# Patient Record
Sex: Female | Born: 1980 | Race: White | Hispanic: No | Marital: Married | Smoking: Never smoker
Health system: Southern US, Community
[De-identification: ages and names within clinical notes are randomized; demographics above are authoritative.]

## PROBLEM LIST (undated history)

## (undated) ENCOUNTER — Emergency Department (HOSPITAL_BASED_OUTPATIENT_CLINIC_OR_DEPARTMENT_OTHER): Admission: EM | Source: Home / Self Care

## (undated) DIAGNOSIS — K219 Gastro-esophageal reflux disease without esophagitis: Secondary | ICD-10-CM

## (undated) DIAGNOSIS — Z5189 Encounter for other specified aftercare: Secondary | ICD-10-CM

## (undated) DIAGNOSIS — Q339 Congenital malformation of lung, unspecified: Secondary | ICD-10-CM

## (undated) DIAGNOSIS — J189 Pneumonia, unspecified organism: Secondary | ICD-10-CM

## (undated) DIAGNOSIS — K760 Fatty (change of) liver, not elsewhere classified: Secondary | ICD-10-CM

## (undated) DIAGNOSIS — T7840XA Allergy, unspecified, initial encounter: Secondary | ICD-10-CM

## (undated) DIAGNOSIS — I10 Essential (primary) hypertension: Secondary | ICD-10-CM

## (undated) DIAGNOSIS — F419 Anxiety disorder, unspecified: Secondary | ICD-10-CM

## (undated) HISTORY — DX: Encounter for other specified aftercare: Z51.89

## (undated) HISTORY — DX: Allergy, unspecified, initial encounter: T78.40XA

## (undated) HISTORY — PX: WISDOM TOOTH EXTRACTION: SHX21

## (undated) HISTORY — PX: NO PAST SURGERIES: SHX2092

---
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
## 2005-08-19 DIAGNOSIS — Z8759 Personal history of other complications of pregnancy, childbirth and the puerperium: Secondary | ICD-10-CM

## 2005-08-19 HISTORY — DX: Personal history of other complications of pregnancy, childbirth and the puerperium: Z87.59

## 2006-08-13 ENCOUNTER — Inpatient Hospital Stay (HOSPITAL_COMMUNITY): Admission: AD | Admit: 2006-08-13 | Discharge: 2006-08-17 | Payer: Self-pay | Admitting: Obstetrics and Gynecology

## 2006-08-13 ENCOUNTER — Ambulatory Visit: Payer: Self-pay | Admitting: Neonatology

## 2006-09-16 ENCOUNTER — Inpatient Hospital Stay (HOSPITAL_COMMUNITY): Admission: AD | Admit: 2006-09-16 | Discharge: 2006-09-16 | Payer: Self-pay | Admitting: Obstetrics and Gynecology

## 2007-12-25 IMAGING — US US OB COMP +14 WK
1 series · 13 of 28 positions shown · non-contrast
Comparison: none

CLINICAL DATA: 25-year-old, G1, LMP 01/18/06 ([DATE] weeks 4 days), presenting with pregnancy induced hypertension, chest pain, elevated liver enzymes, possible HELLP syndrome.

[Series 1: us ob comp +14 wk · 0.30mm/px · 33 acquisitions, 13 frames shown]
[im 2/33]
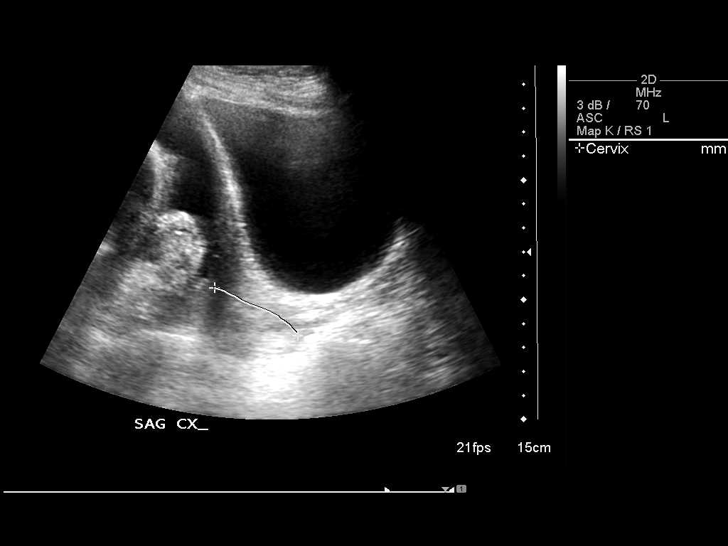
[im 4/33]
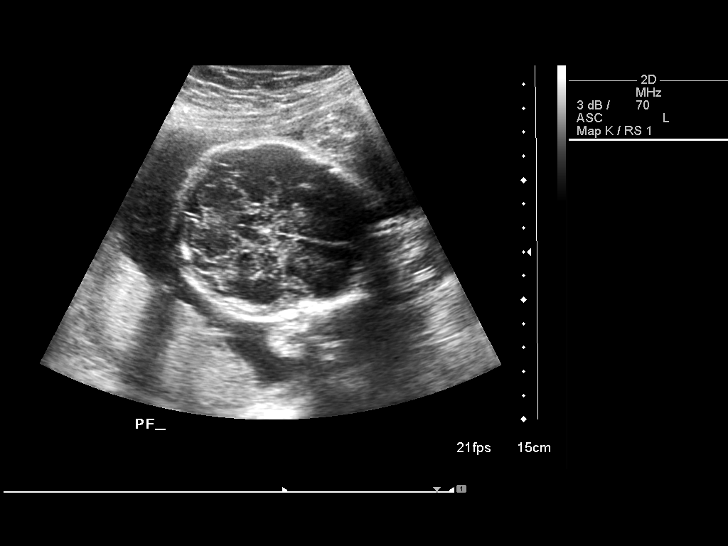
[im 6/33]
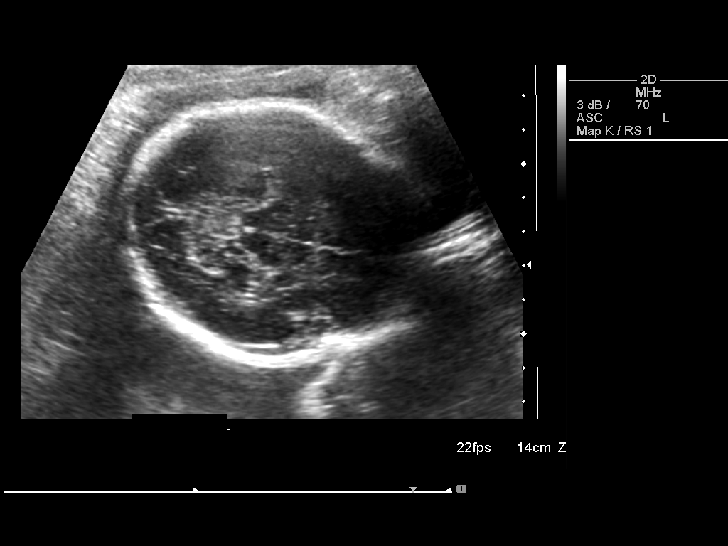
[im 9/33]
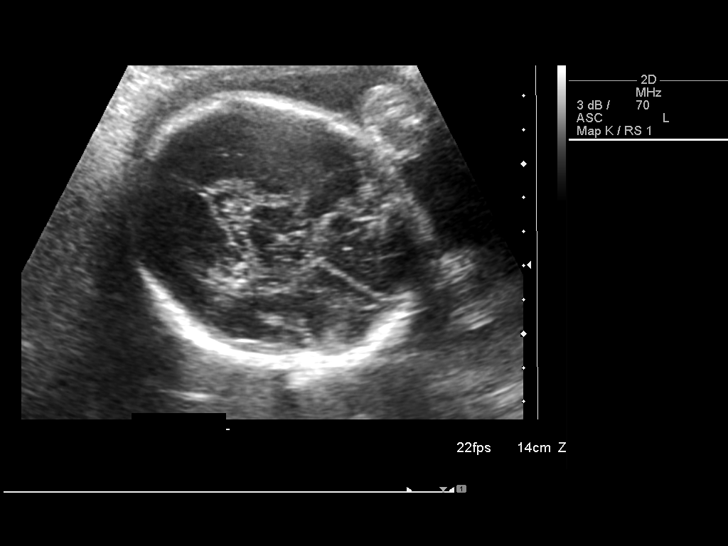
[im 11/33]
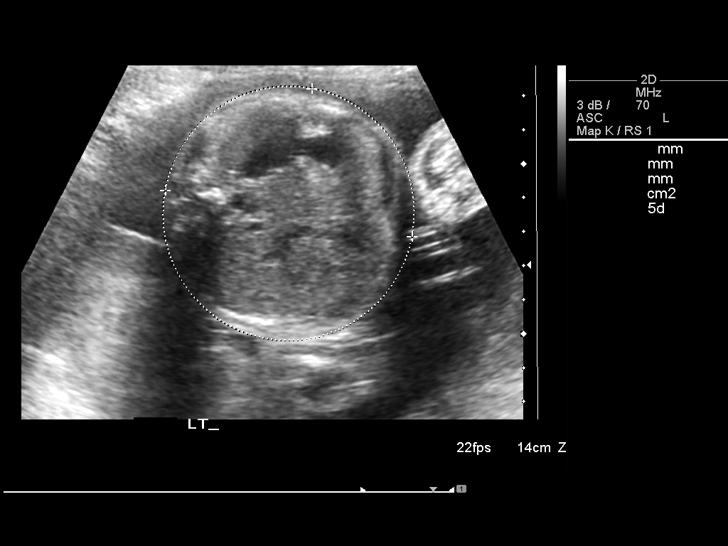
[im 14/33]
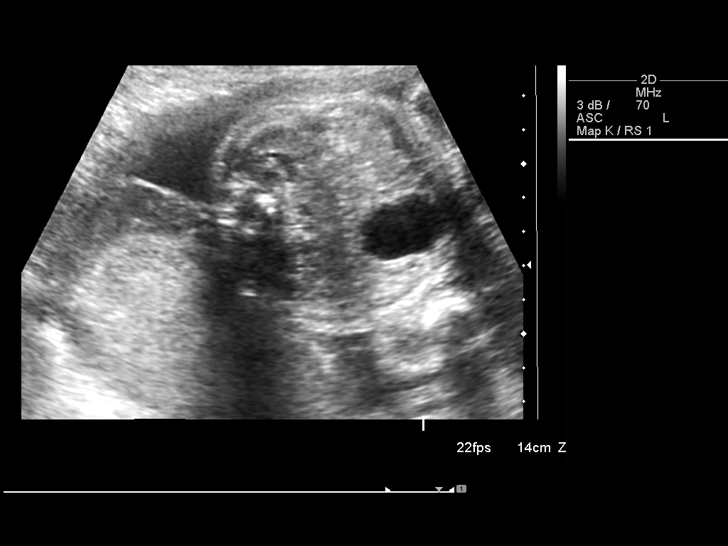
[im 17/33]
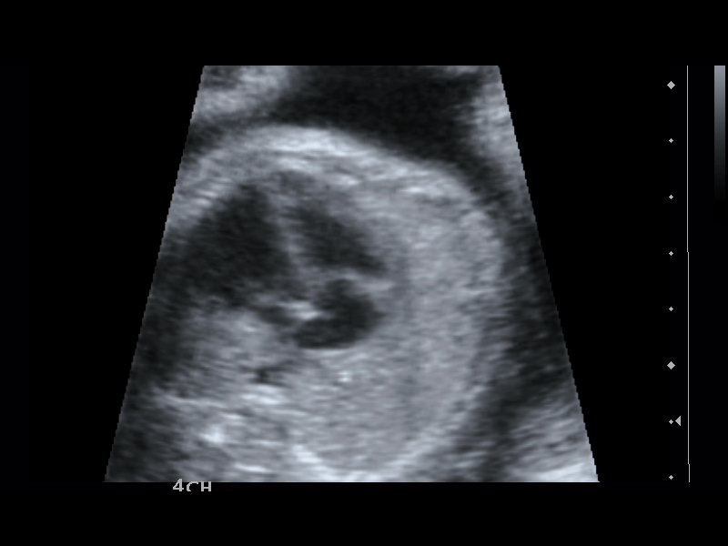
[im 19/33]
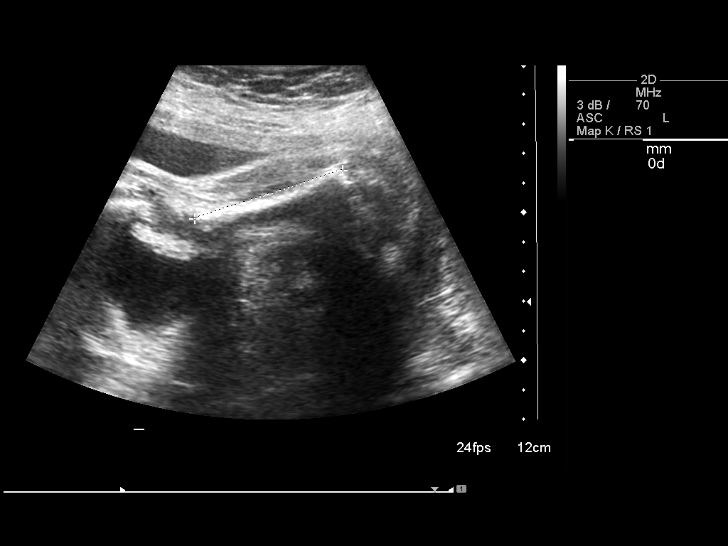
[im 22/33]
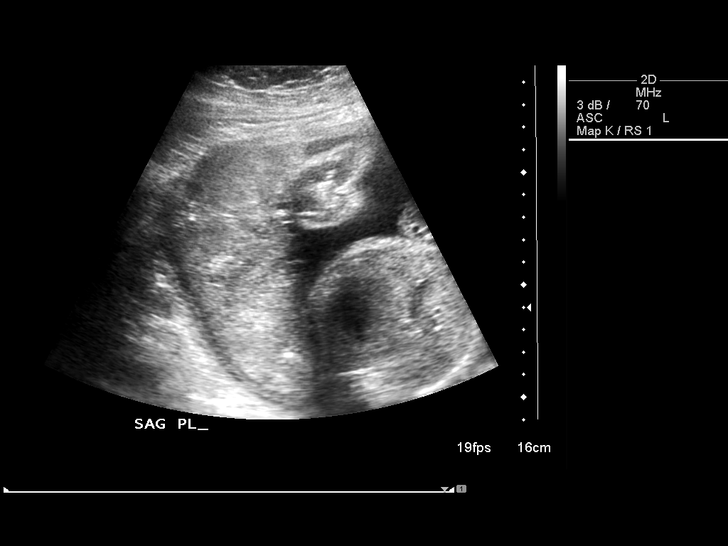
[im 24/33]
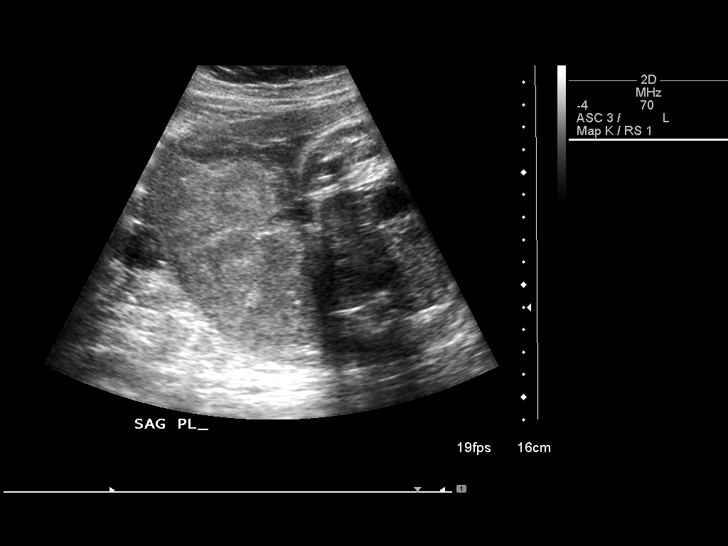
[im 27/33]
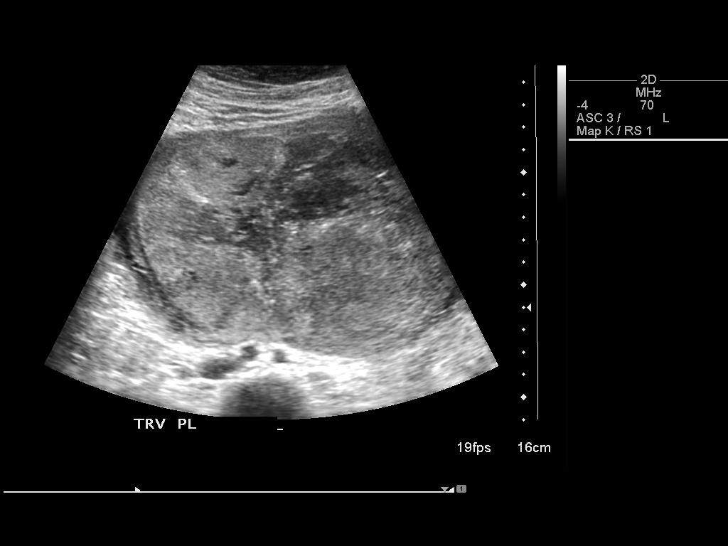
[im 29/33]
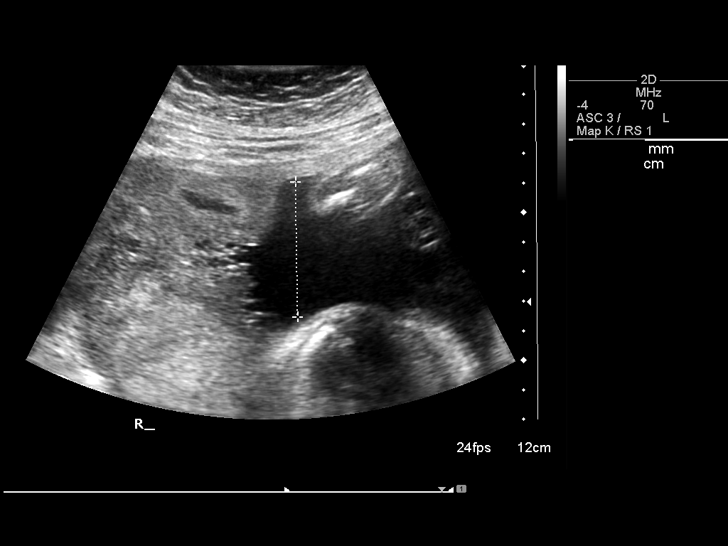
[im 31/33]
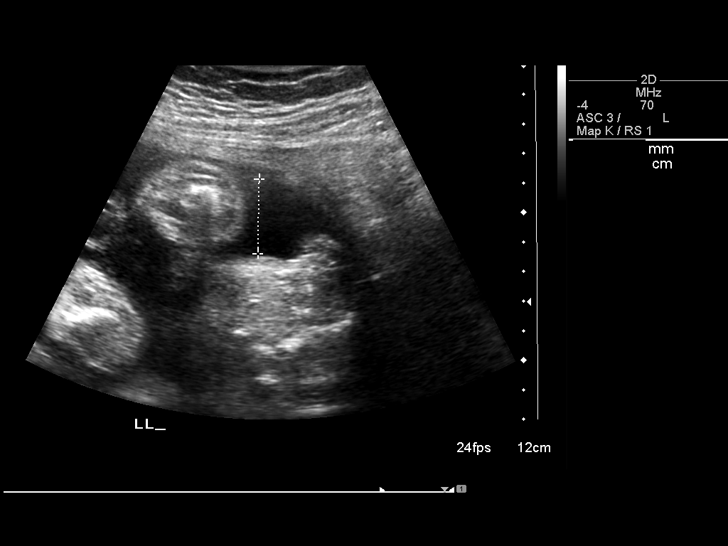

[13 of 28 positions shown; findings below may reference images not displayed]

OBSTETRICAL ULTRASOUND AND DOPPLER ULTRASOUND OF FETUS:

 Number of Fetuses:  1
 Heart Rate:  160 bpm
 Movement:   Yes
 Breathing:  No
 Presentation:  Variable
 Placental Location:  Fundal
 Grade:  II
 Previa:  No
 Amniotic Fluid (Subjective):  Normal
 Amniotic Fluid (Objective):  AFI 15.2 cm (1th-01th %ile = 9.0 to 23.4 cm for 30 weeks) 

 FETAL BIOMETRY
 BPD:  7.4 cm   29 w 6 d 
 HC:  26.7 cm   29 w 1 d 
 AC:  23.4 cm   27 w 6 d 
 FL:  5.2 cm   27 w 0 d 

 MEAN GA:  28 w 5 d   US EDC:  10/31/06
 Assigned GA:  29 w 4 d   Assigned EDC:  10/25/06

 EFW:  3360 grams 10th ? 25th %ile (2797 ? 3439 g) for 30 weeks 

 FETAL ANATOMY
 Lateral Ventricles:  Visualized 
 Thalami/CSP:  Visualized 
 Posterior Fossa:  Visualized 
 Nuchal Region:  Visualized 
 Spine:  Not visualized 
 4 Chamber Heart on Left:  Visualized 
 Stomach on Left:  Visualized 
 3 Vessel Cord:  Visualized 
 Cord Insertion site:  Not visualized 
 Kidneys:  Visualized 
 Bladder:  Visualized 
 Extremities:  Not visualized 

 ADDITIONAL ANATOMY VISUALIZED:  Diaphragm.

 Evaluation limited by:  Advanced gestational age.  

 DOPPLER ULTRASOUND OF FETUS:

 Umbilical Artery S/D Ratio:  2.84  (NL< 4.17)

 MATERNAL UTERINE AND ADNEXAL FINDINGS
 Cervix:  4.1 cm transabdominal.
IMPRESSION: 1.  Single living intrauterine fetus with cephalic presentation, though the feet are currently the presenting parts.  
 2.   Estimated gestational age by ultrasound 28 weeks 5 days, 6 days earlier than the estimated gestational age by LMP.  
 3.  Anterior grade II placenta with no evidence of previa.  
 4.  Subjectively and objectively normal amniotic fluid.  
 5.  Maternal cervix 4.1 cm in length measured transabdominally.
 6.  Normal umbilical artery systolic/diastolic ratio.

## 2007-12-25 IMAGING — US US ABDOMEN COMPLETE
1 series · 14 of 25 positions shown · non-contrast
Comparison: none

CLINICAL DATA: 24 weeks pregnant. Right upper quadrant and epigastric abdominal pain.
 ABDOMEN ULTRASOUND:
TECHNIQUE: Complete abdominal ultrasound examination was performed including evaluation of the liver, gallbladder, bile ducts, pancreas, kidneys, spleen, IVC, and abdominal aorta.

[Series 1: us abdomen complete · 0.25mm/px · 14 of 85 slices shown]
[im 1/85]
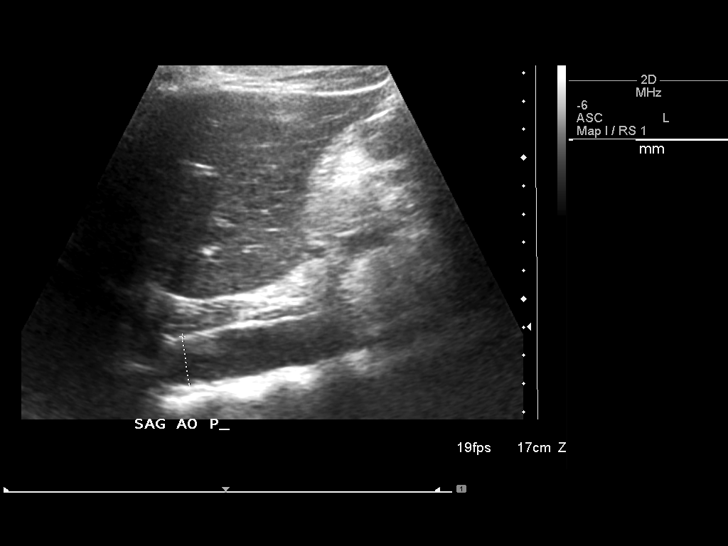
[im 8/85]
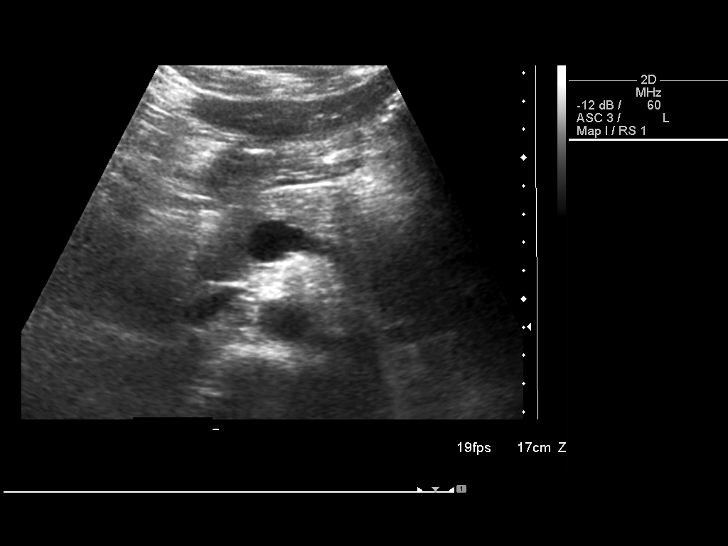
[im 15/85]
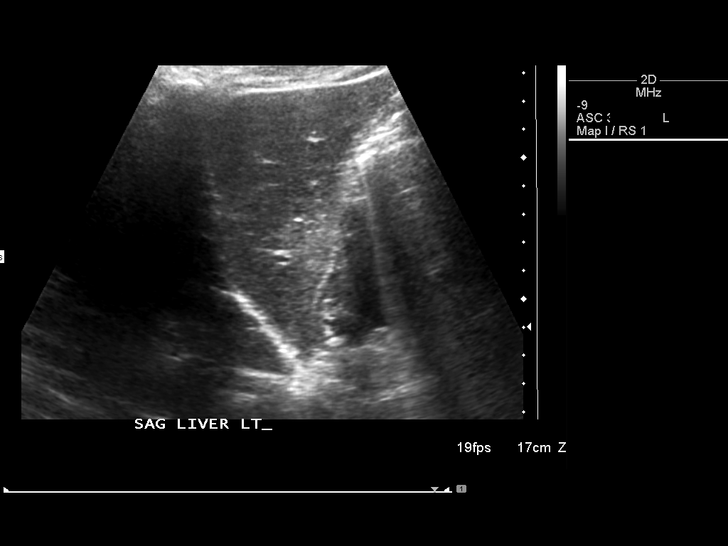
[im 22/85]
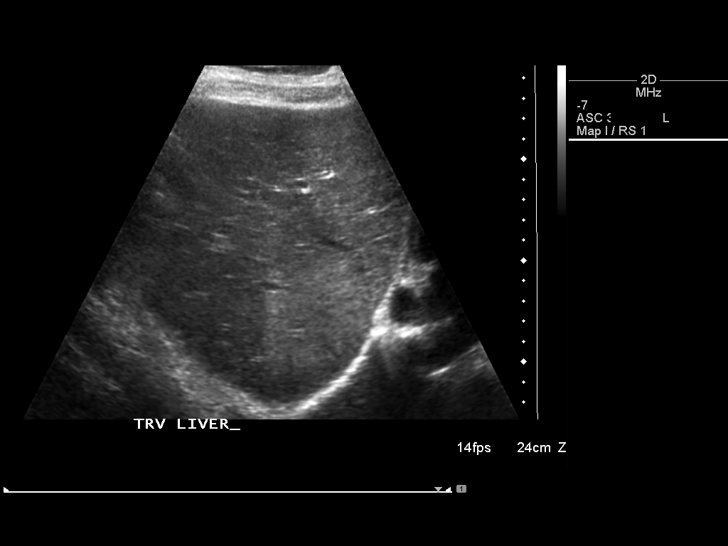
[im 29/85]
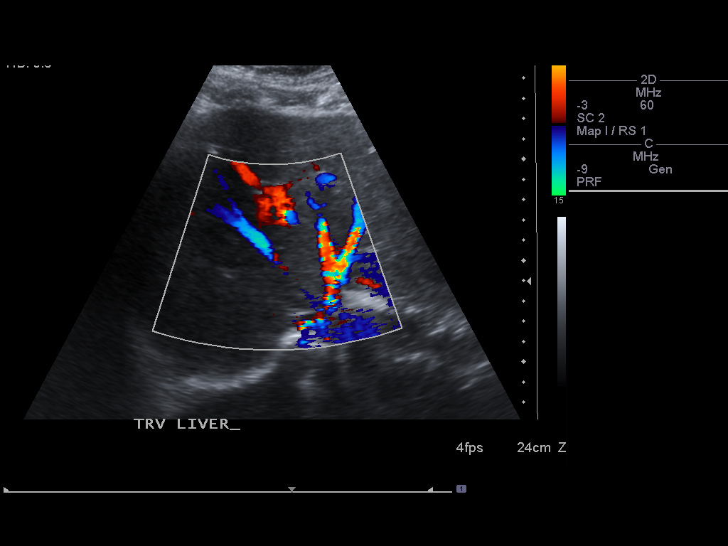
[im 32/85]
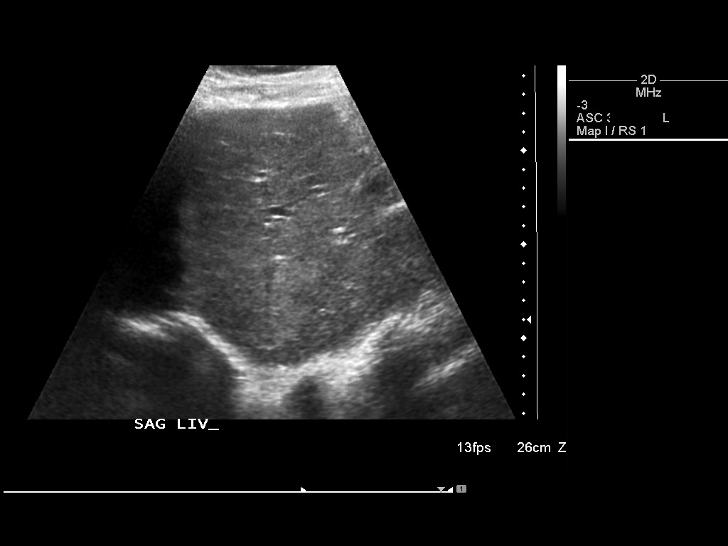
[im 39/85]
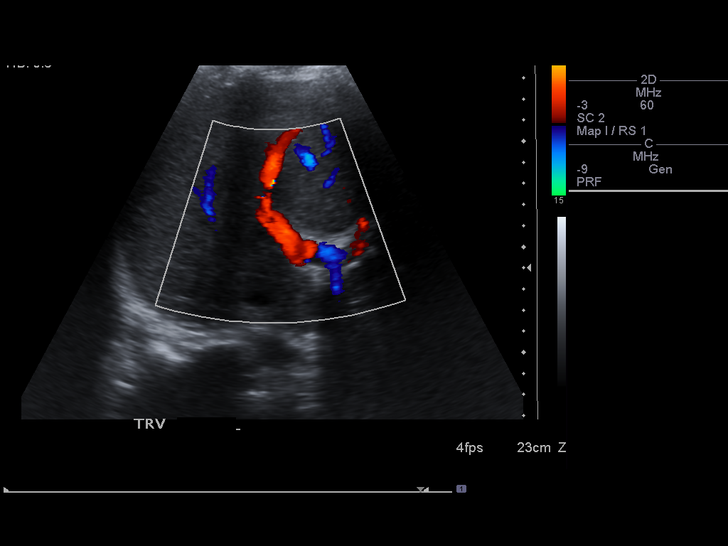
[im 46/85]
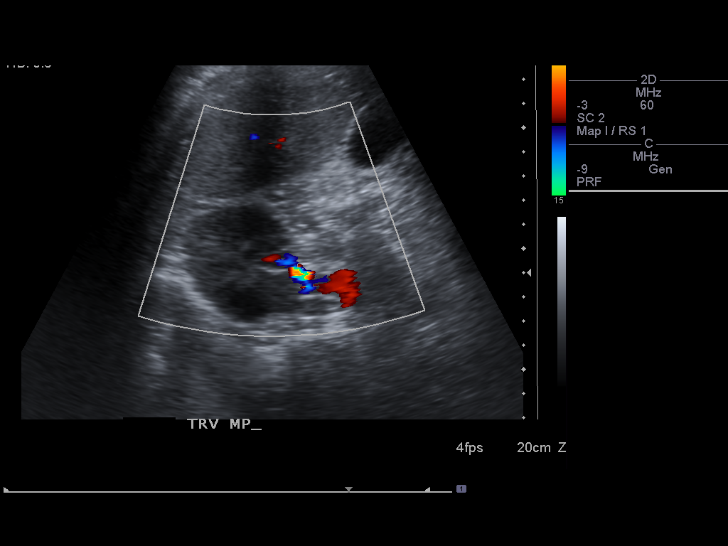
[im 53/85]
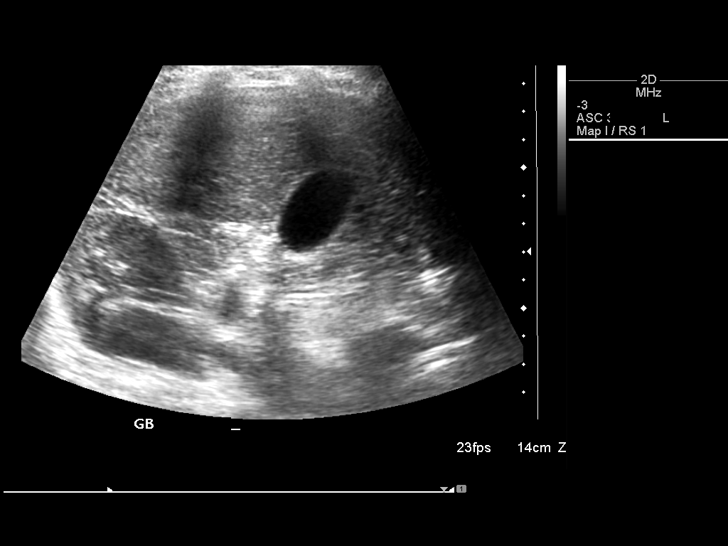
[im 57/85]
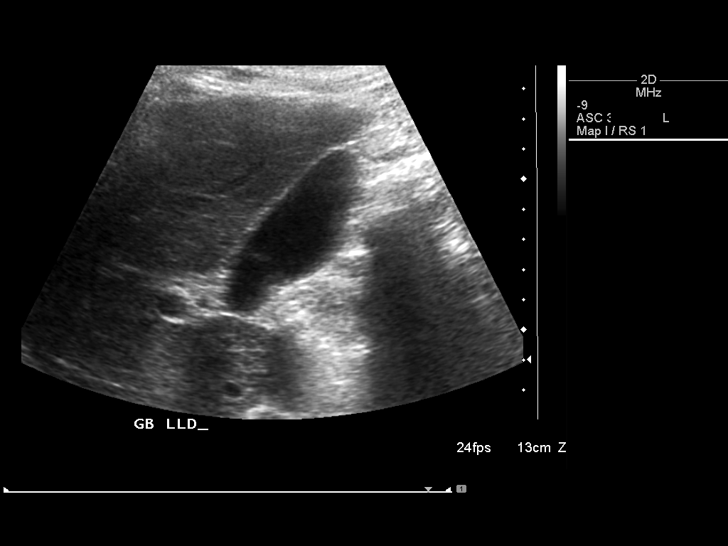
[im 64/85]
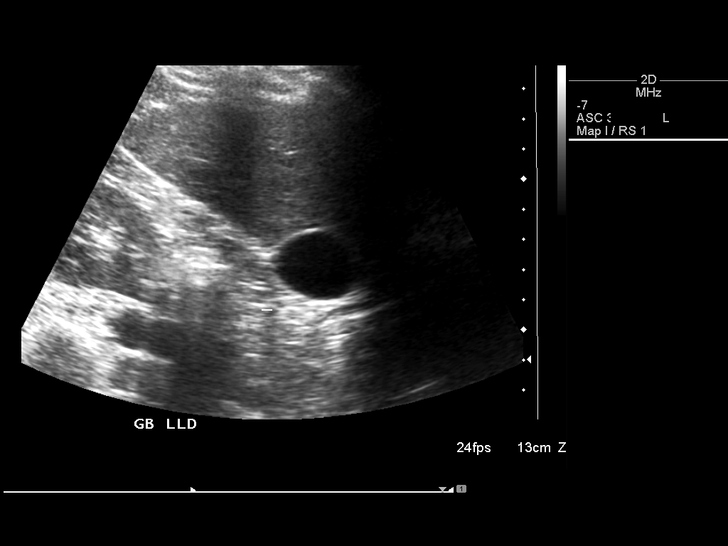
[im 71/85]
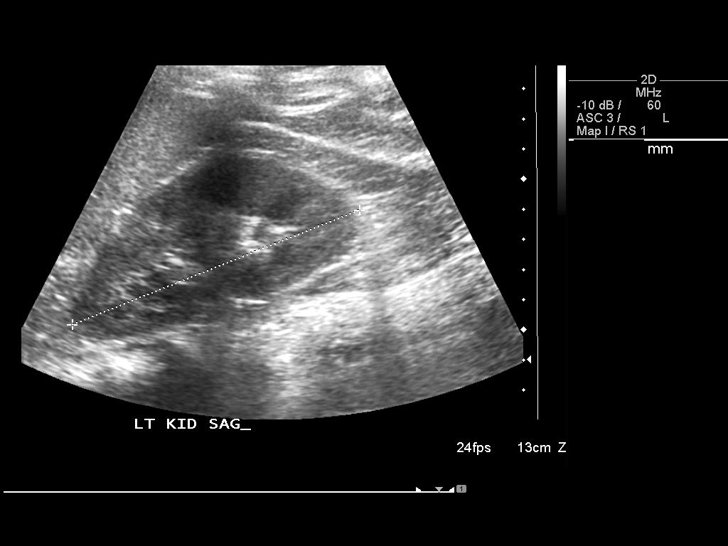
[im 78/85]
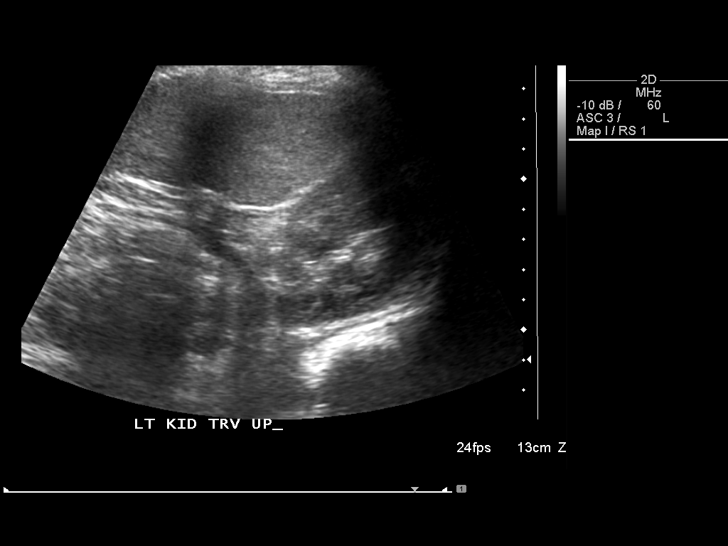
[im 85/85]
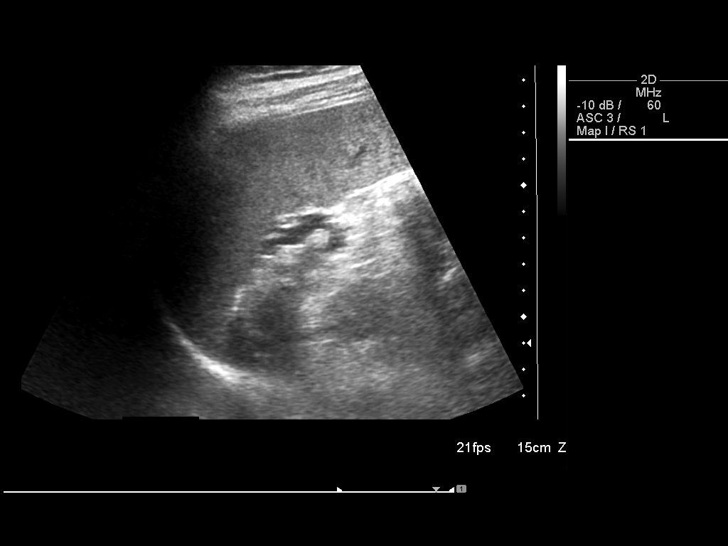

[14 of 25 positions shown; findings below may reference images not displayed]

FINDINGS: There is no evidence of gallstones or biliary ductal dilatation.  The liver is within normal limits in echogenicity, and no focal liver lesions are seen.  The visualized portions of the IVC and pancreas are unremarkable.
 There is no evidence of splenomegaly.  The kidneys are unremarkable, and there is no evidence of hydronephrosis.  The abdominal aorta is non-dilated.
IMPRESSION: Negative abdominal ultrasound.

## 2008-01-28 IMAGING — US US RENAL
1 series · 14 of 25 positions shown · non-contrast
Comparison: none

CLINICAL DATA: Left flank pain.  Postop from C-section approximately one month ago.  
 RENAL ULTRASOUND:
TECHNIQUE: Complete ultrasound examination of the urinary tract was performed including evaluation of the kidneys, renal collecting systems, and urinary bladder.

[Series 1: us renal · 0.30mm/px · 14 of 32 slices shown]
[im 1/32]
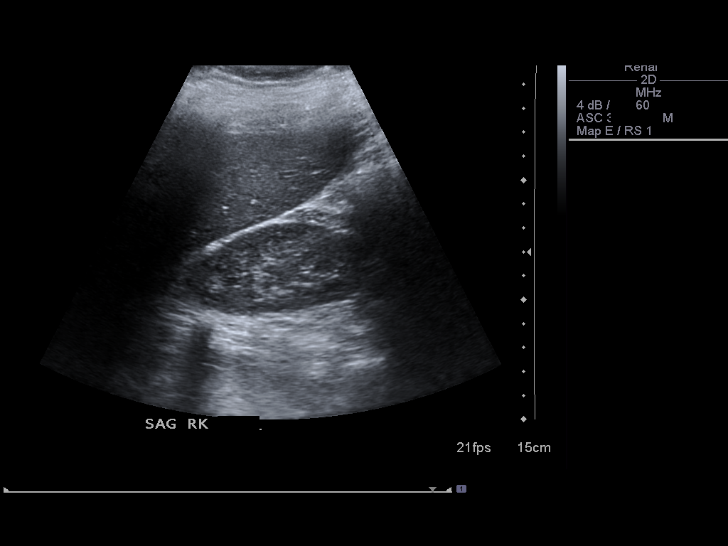
[im 3/32]
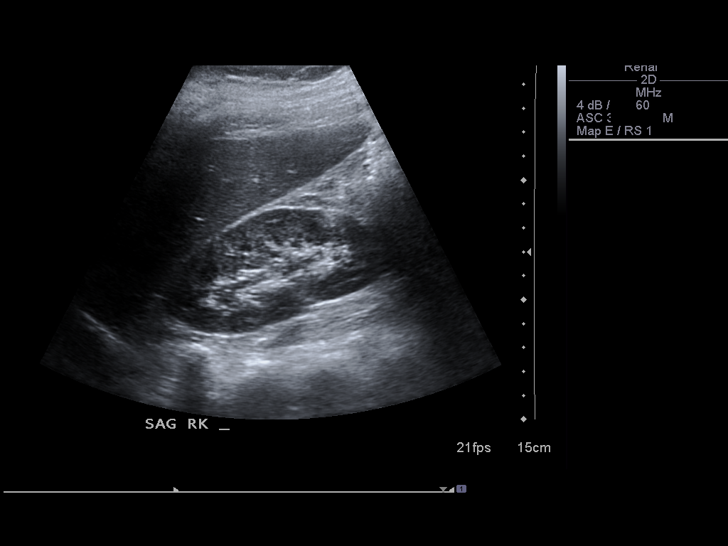
[im 6/32]
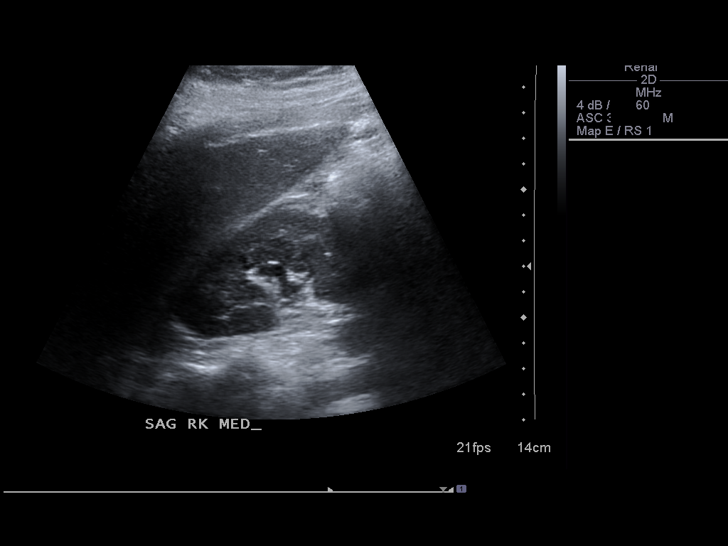
[im 8/32]
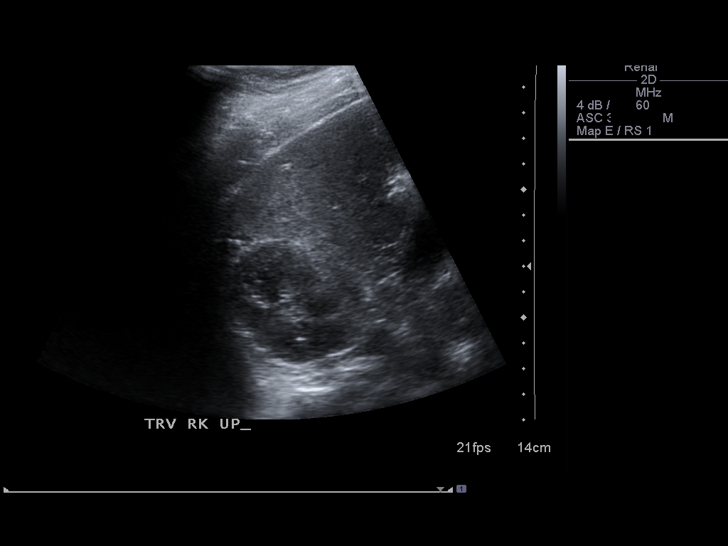
[im 11/32]
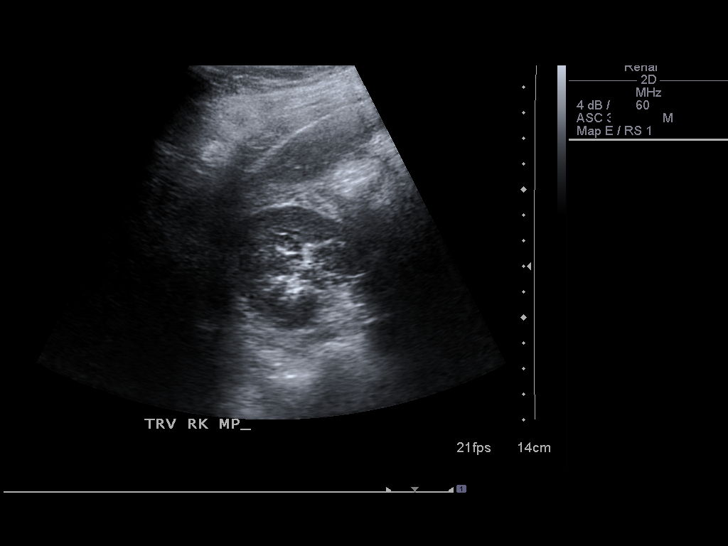
[im 12/32]
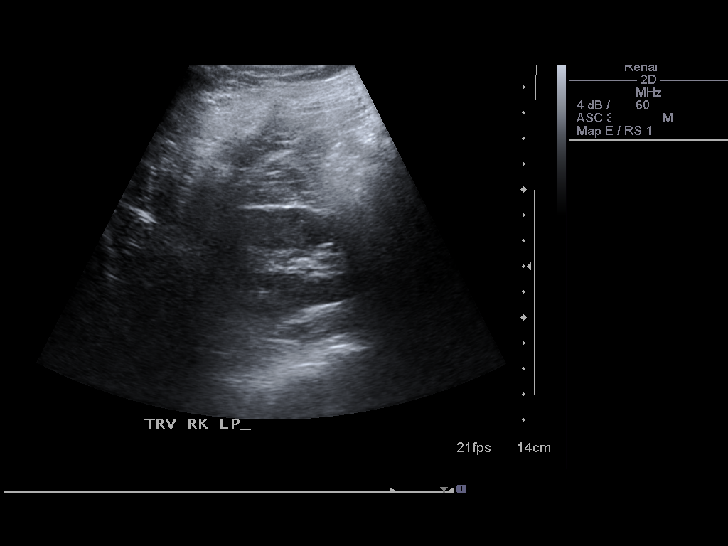
[im 15/32]
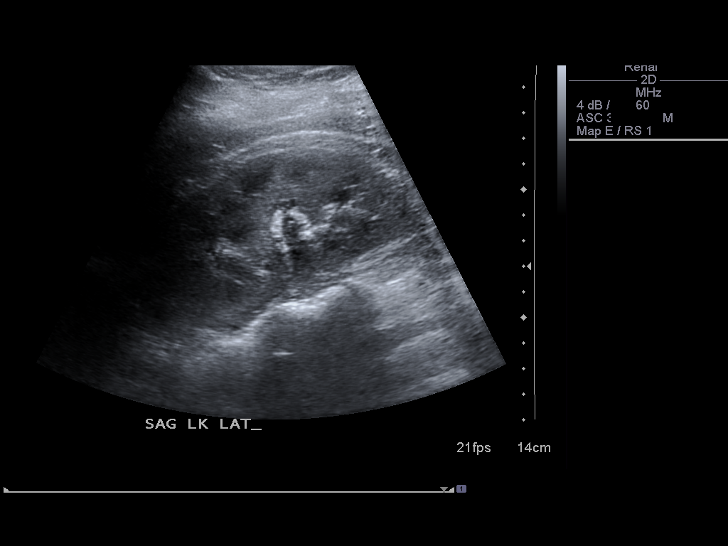
[im 17/32]
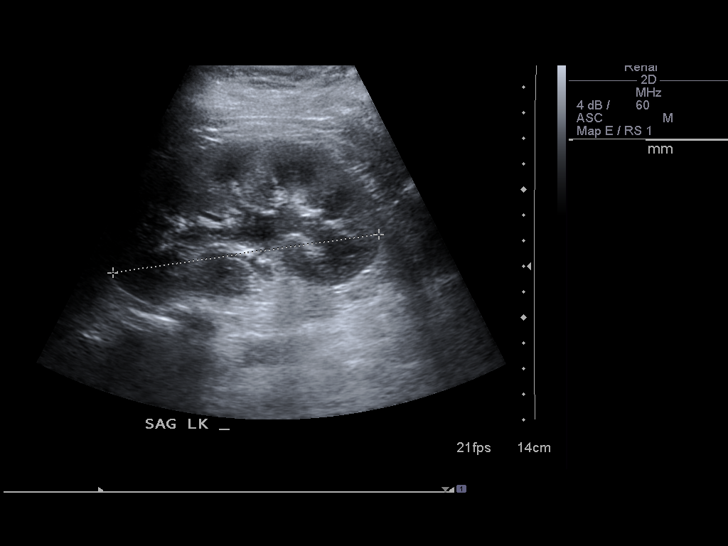
[im 20/32]
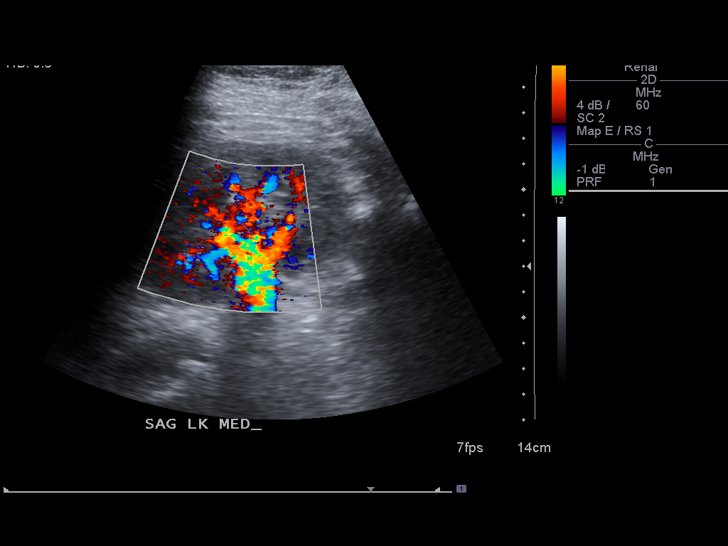
[im 21/32]
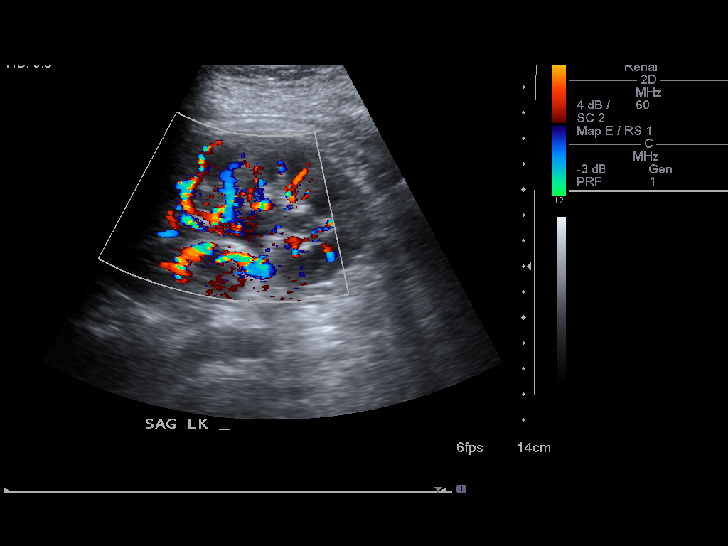
[im 24/32]
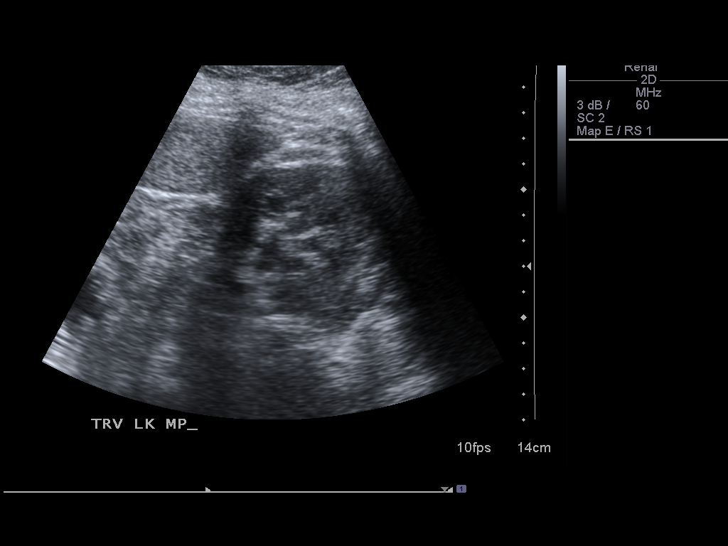
[im 26/32]
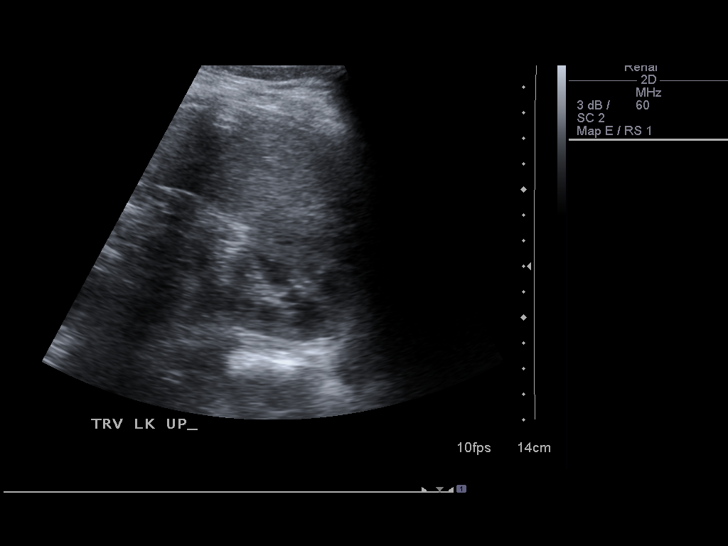
[im 29/32]
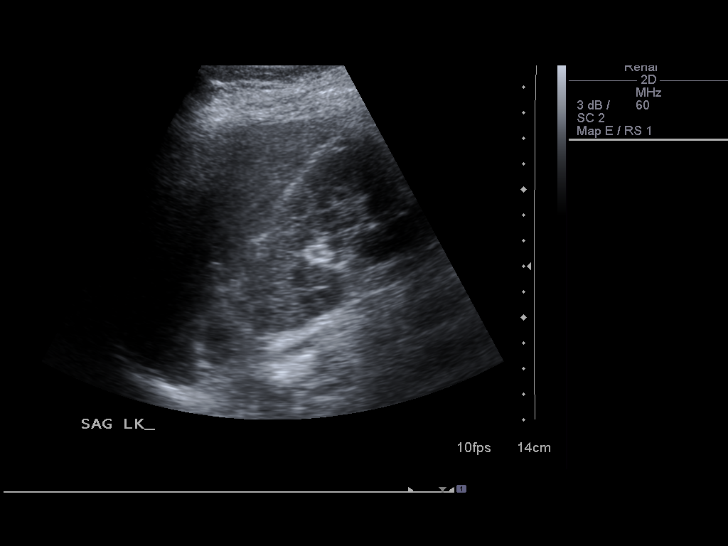
[im 32/32]
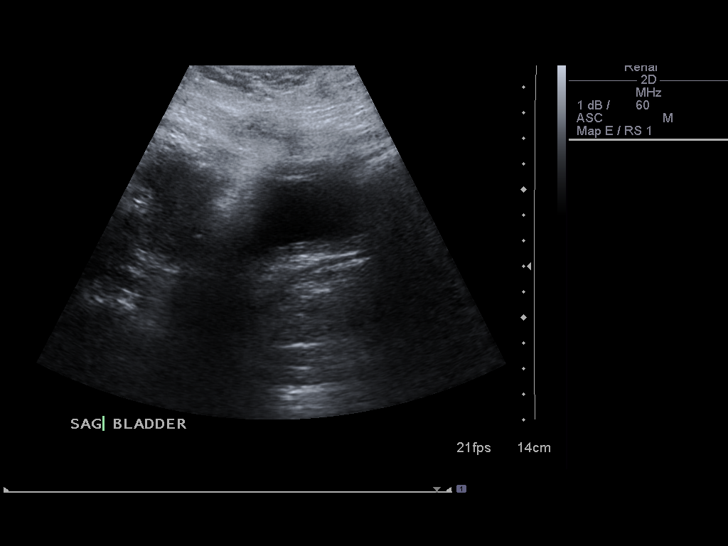

[14 of 25 positions shown; findings below may reference images not displayed]

FINDINGS: Both kidneys are within normal limits in size and parenchymal echogenicity.  No renal parenchymal lesions are seen.  There is no evidence of hydronephrosis.  No perinephric abnormalities are seen.
 Images of the urinary bladder are unremarkable for the degree of bladder filling.
IMPRESSION: Normal study.

## 2015-04-13 ENCOUNTER — Other Ambulatory Visit: Payer: Self-pay | Admitting: Obstetrics and Gynecology

## 2015-04-14 LAB — CYTOLOGY - PAP

## 2015-10-11 NOTE — Progress Notes (Signed)
This encounter was created in error - please disregard.

## 2017-01-19 ENCOUNTER — Encounter (HOSPITAL_BASED_OUTPATIENT_CLINIC_OR_DEPARTMENT_OTHER): Payer: Self-pay | Admitting: Emergency Medicine

## 2017-01-19 ENCOUNTER — Emergency Department (HOSPITAL_BASED_OUTPATIENT_CLINIC_OR_DEPARTMENT_OTHER)
Admission: EM | Admit: 2017-01-19 | Discharge: 2017-01-19 | Disposition: A | Discharge status: Home / Self Care | Payer: BLUE CROSS/BLUE SHIELD | Attending: Emergency Medicine | Admitting: Emergency Medicine

## 2017-01-19 ENCOUNTER — Emergency Department (HOSPITAL_BASED_OUTPATIENT_CLINIC_OR_DEPARTMENT_OTHER): Payer: BLUE CROSS/BLUE SHIELD

## 2017-01-19 DIAGNOSIS — Z79899 Other long term (current) drug therapy: Secondary | ICD-10-CM | POA: Diagnosis not present

## 2017-01-19 DIAGNOSIS — R0781 Pleurodynia: Secondary | ICD-10-CM

## 2017-01-19 DIAGNOSIS — R079 Chest pain, unspecified: Secondary | ICD-10-CM | POA: Diagnosis present

## 2017-01-19 DIAGNOSIS — J181 Lobar pneumonia, unspecified organism: Secondary | ICD-10-CM | POA: Diagnosis not present

## 2017-01-19 DIAGNOSIS — J189 Pneumonia, unspecified organism: Secondary | ICD-10-CM

## 2017-01-19 HISTORY — DX: Gastro-esophageal reflux disease without esophagitis: K21.9

## 2017-01-19 LAB — COMPREHENSIVE METABOLIC PANEL
ALK PHOS: 63 U/L (ref 38–126)
ALT: 28 U/L (ref 14–54)
ANION GAP: 9 (ref 5–15)
AST: 24 U/L (ref 15–41)
Albumin: 4.5 g/dL (ref 3.5–5.0)
BILIRUBIN TOTAL: 0.5 mg/dL (ref 0.3–1.2)
BUN: 11 mg/dL (ref 6–20)
CALCIUM: 9.3 mg/dL (ref 8.9–10.3)
CO2: 28 mmol/L (ref 22–32)
Chloride: 100 mmol/L — ABNORMAL LOW (ref 101–111)
Creatinine, Ser: 0.66 mg/dL (ref 0.44–1.00)
GFR calc non Af Amer: >60 mL/min (ref >60)
Glucose, Bld: 122 mg/dL — ABNORMAL HIGH (ref 65–99)
POTASSIUM: 3.1 mmol/L — AB (ref 3.5–5.1)
Sodium: 137 mmol/L (ref 135–145)
TOTAL PROTEIN: 7.9 g/dL (ref 6.5–8.1)

## 2017-01-19 LAB — CBC WITH DIFFERENTIAL/PLATELET
BASOS PCT: 0 %
Basophils Absolute: 0 10*3/uL (ref 0.0–0.1)
Eosinophils Absolute: 0 10*3/uL (ref 0.0–0.7)
Eosinophils Relative: 0 %
HEMATOCRIT: 42.3 % (ref 36.0–46.0)
HEMOGLOBIN: 14.2 g/dL (ref 12.0–15.0)
LYMPHS ABS: 3.8 10*3/uL (ref 0.7–4.0)
Lymphocytes Relative: 31 %
MCH: 30.4 pg (ref 26.0–34.0)
MCHC: 33.6 g/dL (ref 30.0–36.0)
MCV: 90.6 fL (ref 78.0–100.0)
MONO ABS: 0.7 10*3/uL (ref 0.1–1.0)
MONOS PCT: 6 %
NEUTROS ABS: 7.6 10*3/uL (ref 1.7–7.7)
NEUTROS PCT: 63 %
Platelets: 250 10*3/uL (ref 150–400)
RBC: 4.67 MIL/uL (ref 3.87–5.11)
RDW: 13.2 % (ref 11.5–15.5)
WBC: 12.2 10*3/uL — ABNORMAL HIGH (ref 4.0–10.5)

## 2017-01-19 LAB — LIPASE, BLOOD: LIPASE: 23 U/L (ref 11–51)

## 2017-01-19 LAB — D-DIMER, QUANTITATIVE (NOT AT ARMC): D DIMER QUANT: 0.27 ug{FEU}/mL (ref 0.00–0.50)

## 2017-01-19 LAB — TROPONIN I

## 2017-01-19 LAB — PREGNANCY, URINE: PREG TEST UR: NEGATIVE

## 2017-01-19 MED ORDER — FLUCONAZOLE 150 MG PO TABS: ORAL_TABLET | ORAL | 0 refills | Status: DC

## 2017-01-19 MED ORDER — LEVOFLOXACIN 750 MG PO TABS
750.0000 mg | ORAL_TABLET | Freq: Once | ORAL | Status: AC
  Administered 2017-01-19: 750 mg via ORAL
  Filled 2017-01-19: qty 1

## 2017-01-19 MED ORDER — ONDANSETRON HCL 4 MG/2ML IJ SOLN
4.0000 mg | Freq: Once | INTRAMUSCULAR | Status: AC
Start: 2017-01-19 — End: 2017-01-19
  Administered 2017-01-19: 4 mg via INTRAVENOUS
  Filled 2017-01-19: qty 2

## 2017-01-19 MED ORDER — POTASSIUM CHLORIDE CRYS ER 20 MEQ PO TBCR
40.0000 meq | EXTENDED_RELEASE_TABLET | Freq: Once | ORAL | Status: AC
  Administered 2017-01-19: 40 meq via ORAL
  Filled 2017-01-19: qty 2

## 2017-01-19 MED ORDER — KETOROLAC TROMETHAMINE 15 MG/ML IJ SOLN
15.0000 mg | Freq: Once | INTRAMUSCULAR | Status: AC
  Administered 2017-01-19: 15 mg via INTRAVENOUS
  Filled 2017-01-19: qty 1

## 2017-01-19 MED ORDER — PANTOPRAZOLE SODIUM 40 MG IV SOLR
40.0000 mg | Freq: Once | INTRAVENOUS | Status: AC
  Administered 2017-01-19: 40 mg via INTRAVENOUS
  Filled 2017-01-19: qty 40

## 2017-01-19 MED ORDER — LEVOFLOXACIN 750 MG PO TABS: ORAL_TABLET | ORAL | 0 refills | Status: DC

## 2017-01-19 NOTE — ED Notes (Signed)
Alert, NAD, calm, interactive, resps e/u, speaking in clear complete sentences, no dyspnea noted, skin W&D, reports pain improved, (denies other sx). Family at [facility].

## 2017-01-19 NOTE — ED Notes (Signed)
Patient transported to X-ray 

## 2017-01-19 NOTE — ED Provider Notes (Signed)
[facility] Provider Note: Georgena Spurling, MD, FACEP  CSN: [id number] MRN: [id number] ARRIVAL: 01/19/17 at [place]: [place]  Chest Pain   HISTORY OF PRESENT ILLNESS  Faith Evans is a 36 y.o. female with a history of GERD. She awoke at about 12:30 this morning with pain in her chest. The pain is located to the right of her lower sternum and radiates into her back. She rates it as a 7 out of 10. She describes it as a tightness that is well localized. It is worse with movement, palpation or deep breathing. She has a sense of being short of breath. There is been associated nausea and sweating. She has not vomited. She denies lower extremity pain or swelling. She denies recent travel. She took 800 milligrams of ibuprofen earlier without relief.  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 This was diagnosed when she developed multiple bouts of otherwise unexplained pneumonia.   Past Medical History:  Diagnosis Date  . GERD (gastroesophageal reflux disease)     No past surgical history on file.  No family history on file.  Social History  Substance Use Topics  . Smoking status: Not on file  . Smokeless tobacco: Not on file  . Alcohol use Not on file    Prior to Admission medications   Medication Sig Start Date End Date Taking? Authorizing Provider  pantoprazole (PROTONIX) 40 MG tablet Take 40 mg by mouth daily.   Yes [provider]  ranitidine (ZANTAC) 150 MG tablet Take 150 mg by mouth daily as needed for heartburn.   Yes [provider]    Allergies Amoxicillin   REVIEW OF SYSTEMS  Negative except as noted here or in the History of Present Illness.   PHYSICAL EXAMINATION  Initial Vital Signs Blood pressure (!) 165/105, pulse 80, temperature 99 F (37.2 C), temperature source Oral, resp. rate 20, height 4\' 10"  (1.473 m), weight 68 kg (150 lb), last menstrual period 01/12/2017, SpO2 100  %.  Examination General: Well-developed, well-nourished female in no acute distress; appearance consistent with age of record HENT: normocephalic; atraumatic Eyes: pupils equal, round and reactive to light; extraocular muscles intact Neck: supple Heart: regular rate and rhythm; no murmur Lungs: Decreased sounds right base Chest: Lower parasternal tenderness, right greater than left Abdomen: soft; nondistended; upper abdominal tenderness; no masses or hepatosplenomegaly; bowel sounds present Back: Right lower parathoracic tenderness Extremities: No deformity; full range of motion; pulses normal; no edema; no calf tenderness Neurologic: Awake, alert and oriented; motor function intact in all extremities and symmetric; no facial droop Skin: Warm and dry Psychiatric: Flat affect   RESULTS  Summary of this visit's results, reviewed by myself:   EKG Interpretation  Date/Time:  Sunday January 19 2017 02:56:01 EDT Ventricular Rate:  72 PR Interval:    QRS Duration: 89 QT Interval:  469 QTC Calculation: 514 R Axis:   35 Text Interpretation:  Sinus rhythm Short PR interval No previous ECGs available Confirmed by Orhan Mayorga, Jenny Reichmann [phone]) on 01/19/2017 2:59:01 AM      Laboratory Studies: Results for orders placed or performed during the hospital encounter of 01/19/17 (from the past 24 hour(s))  CBC with Differential/Platelet     Status: Abnormal   Collection Time: 01/19/17  3:07 AM  Result Value Ref Range   WBC 12.2 (H) 4.0 - 10.5 K/uL   RBC 4.67 3.87 - 5.11 MIL/uL   Hemoglobin 14.2 12.0 -  15.0 g/dL   HCT 42.3 36.0 - 46.0 %   MCV 90.6 78.0 - 100.0 fL   MCH 30.4 26.0 - 34.0 pg   MCHC 33.6 30.0 - 36.0 g/dL   RDW 13.2 11.5 - 15.5 %   Platelets 250 150 - 400 K/uL   Neutrophils Relative % 63 %   Neutro Abs 7.6 1.7 - 7.7 K/uL   Lymphocytes Relative 31 %   Lymphs Abs 3.8 0.7 - 4.0 K/uL   Monocytes Relative 6 %   Monocytes Absolute 0.7 0.1 - 1.0 K/uL   Eosinophils Relative 0 %   Eosinophils  Absolute 0.0 0.0 - 0.7 K/uL   Basophils Relative 0 %   Basophils Absolute 0.0 0.0 - 0.1 K/uL  Comprehensive metabolic panel     Status: Abnormal   Collection Time: 01/19/17  3:07 AM  Result Value Ref Range   Sodium 137 135 - 145 mmol/L   Potassium 3.1 (L) 3.5 - 5.1 mmol/L   Chloride 100 (L) 101 - 111 mmol/L   CO2 28 22 - 32 mmol/L   Glucose, Bld 122 (H) 65 - 99 mg/dL   BUN 11 6 - 20 mg/dL   Creatinine, Ser 0.66 0.44 - 1.00 mg/dL   Calcium 9.3 8.9 - 10.3 mg/dL   Total Protein 7.9 6.5 - 8.1 g/dL   Albumin 4.5 3.5 - 5.0 g/dL   AST 24 15 - 41 U/L   ALT 28 14 - 54 U/L   Alkaline Phosphatase 63 38 - 126 U/L   Total Bilirubin 0.5 0.3 - 1.2 mg/dL   GFR calc non Af Amer >60 >60 mL/min   GFR calc Af Amer >60 >60 mL/min   Anion gap 9 5 - 15  Lipase, blood     Status: None   Collection Time: 01/19/17  3:07 AM  Result Value Ref Range   Lipase 23 11 - 51 U/L  Troponin I     Status: None   Collection Time: 01/19/17  3:07 AM  Result Value Ref Range   Troponin I <0.03 <0.03 ng/mL  D-dimer, quantitative (not at [facility])     Status: None   Collection Time: 01/19/17  3:07 AM  Result Value Ref Range   D-Dimer, Quant 0.27 0.00 - 0.50 ug/mL-FEU  Pregnancy, urine     Status: None   Collection Time: 01/19/17  3:53 AM  Result Value Ref Range   Preg Test, Ur NEGATIVE NEGATIVE   Imaging Studies: Dg Chest 2 View  Result Date: 01/19/2017 CLINICAL DATA:  Chest pain for 3 hours EXAM: CHEST  2 VIEW COMPARISON:  None. FINDINGS: The left lung is clear. There is mild shift of mediastinal contents to the right suggesting volume loss. There is opacity in the right middle and lower lobes which may be due to atelectasis or infiltrate. Possible tiny right pleural effusion or thickening. No pneumothorax. IMPRESSION: Abnormal opacity in the right middle and lower lobes with suggested volume loss and mediastinal shift to the right, findings could be due to atelectasis or possibly an infiltrate. No prior radiographs to  indicate if this is a chronic or acute finding. Electronically Signed   By: Donavan Foil M.D.   On: 01/19/2017 03:35    ED COURSE  Nursing notes and initial vitals signs, including pulse oximetry, reviewed.  Vitals:   01/19/17 0257 01/19/17 0300 01/19/17 0315 01/19/17 0519  BP:  (!) 161/95 (!) 154/93 (!) 141/96  Pulse:  68 71 67  Resp:  (!) 4 (!)  21 (!) 21  Temp:      TempSrc:      SpO2: 100% 100% 99% 98%  Weight:      Height:       5:29 AM Patient's acute pleuritic chest wall pain, leukocytosis and low-grade fever given x-ray findings are consistent with an acute pneumonia. We will avoid a CT scan as the patient does have known lung disease predisposing her to pneumonia. We will treat with a 5 day course of Levaquin.  PROCEDURES    ED DIAGNOSES     ICD-9-CM ICD-10-CM   1. Community acquired pneumonia of right lower lobe of lung ([place]) 481 J18.1   2. Pleuritic chest pain 786.52 R07.81        Shanon Rosser, MD 01/19/17 [phone]

## 2017-01-19 NOTE — ED Triage Notes (Signed)
C/o chest pain while trying to go to sleep. Reports pain is "tightness" in central/right chest through to back. Pt also reports sweating, SOB, and nausea without vomiting.

## 2018-06-02 IMAGING — CR DG CHEST 2V
2 series · 2 of 2 positions shown · non-contrast
Comparison: None.

CLINICAL DATA: Chest pain for 3 hours

EXAM:
CHEST  2 VIEW

[w chest pa]
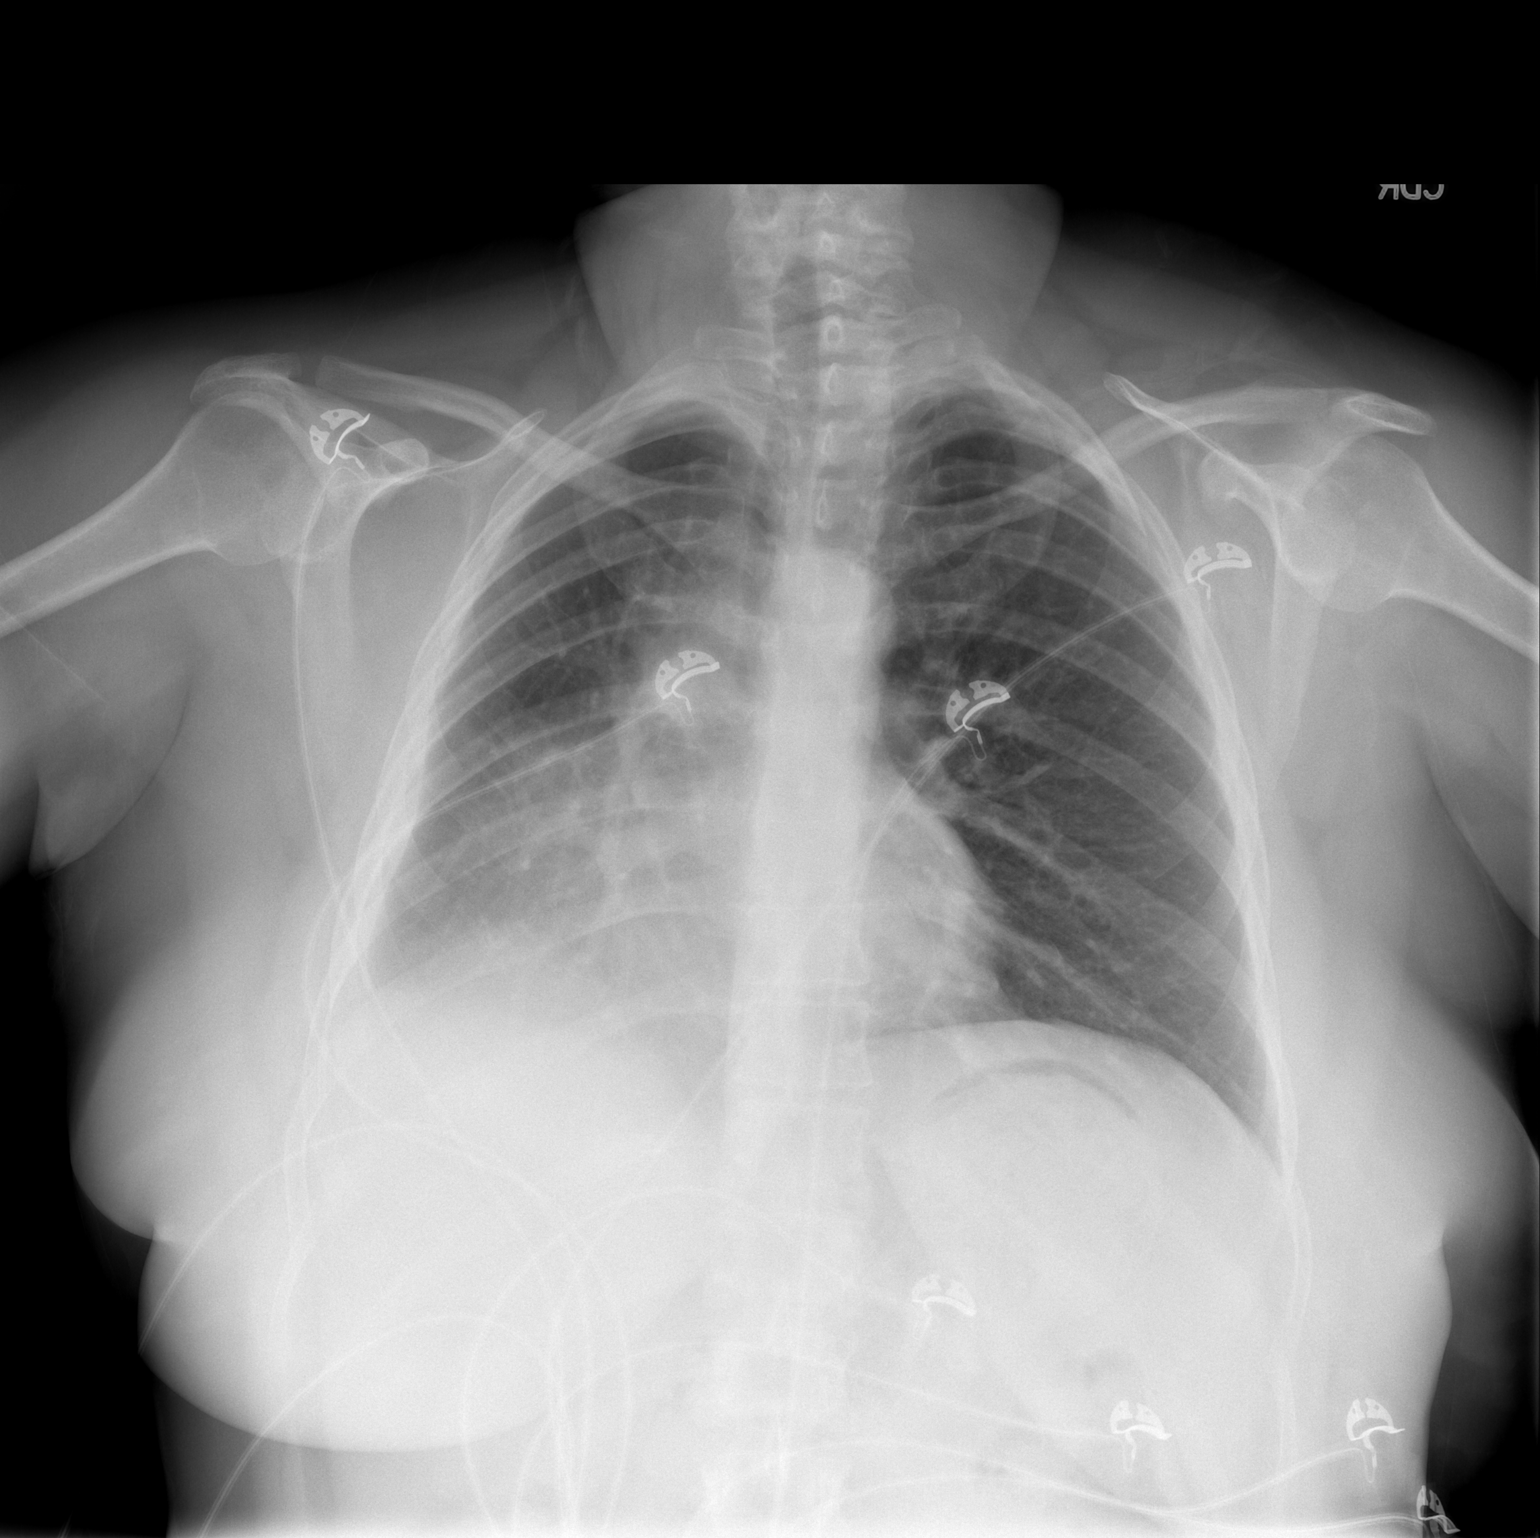

[w chest lat]
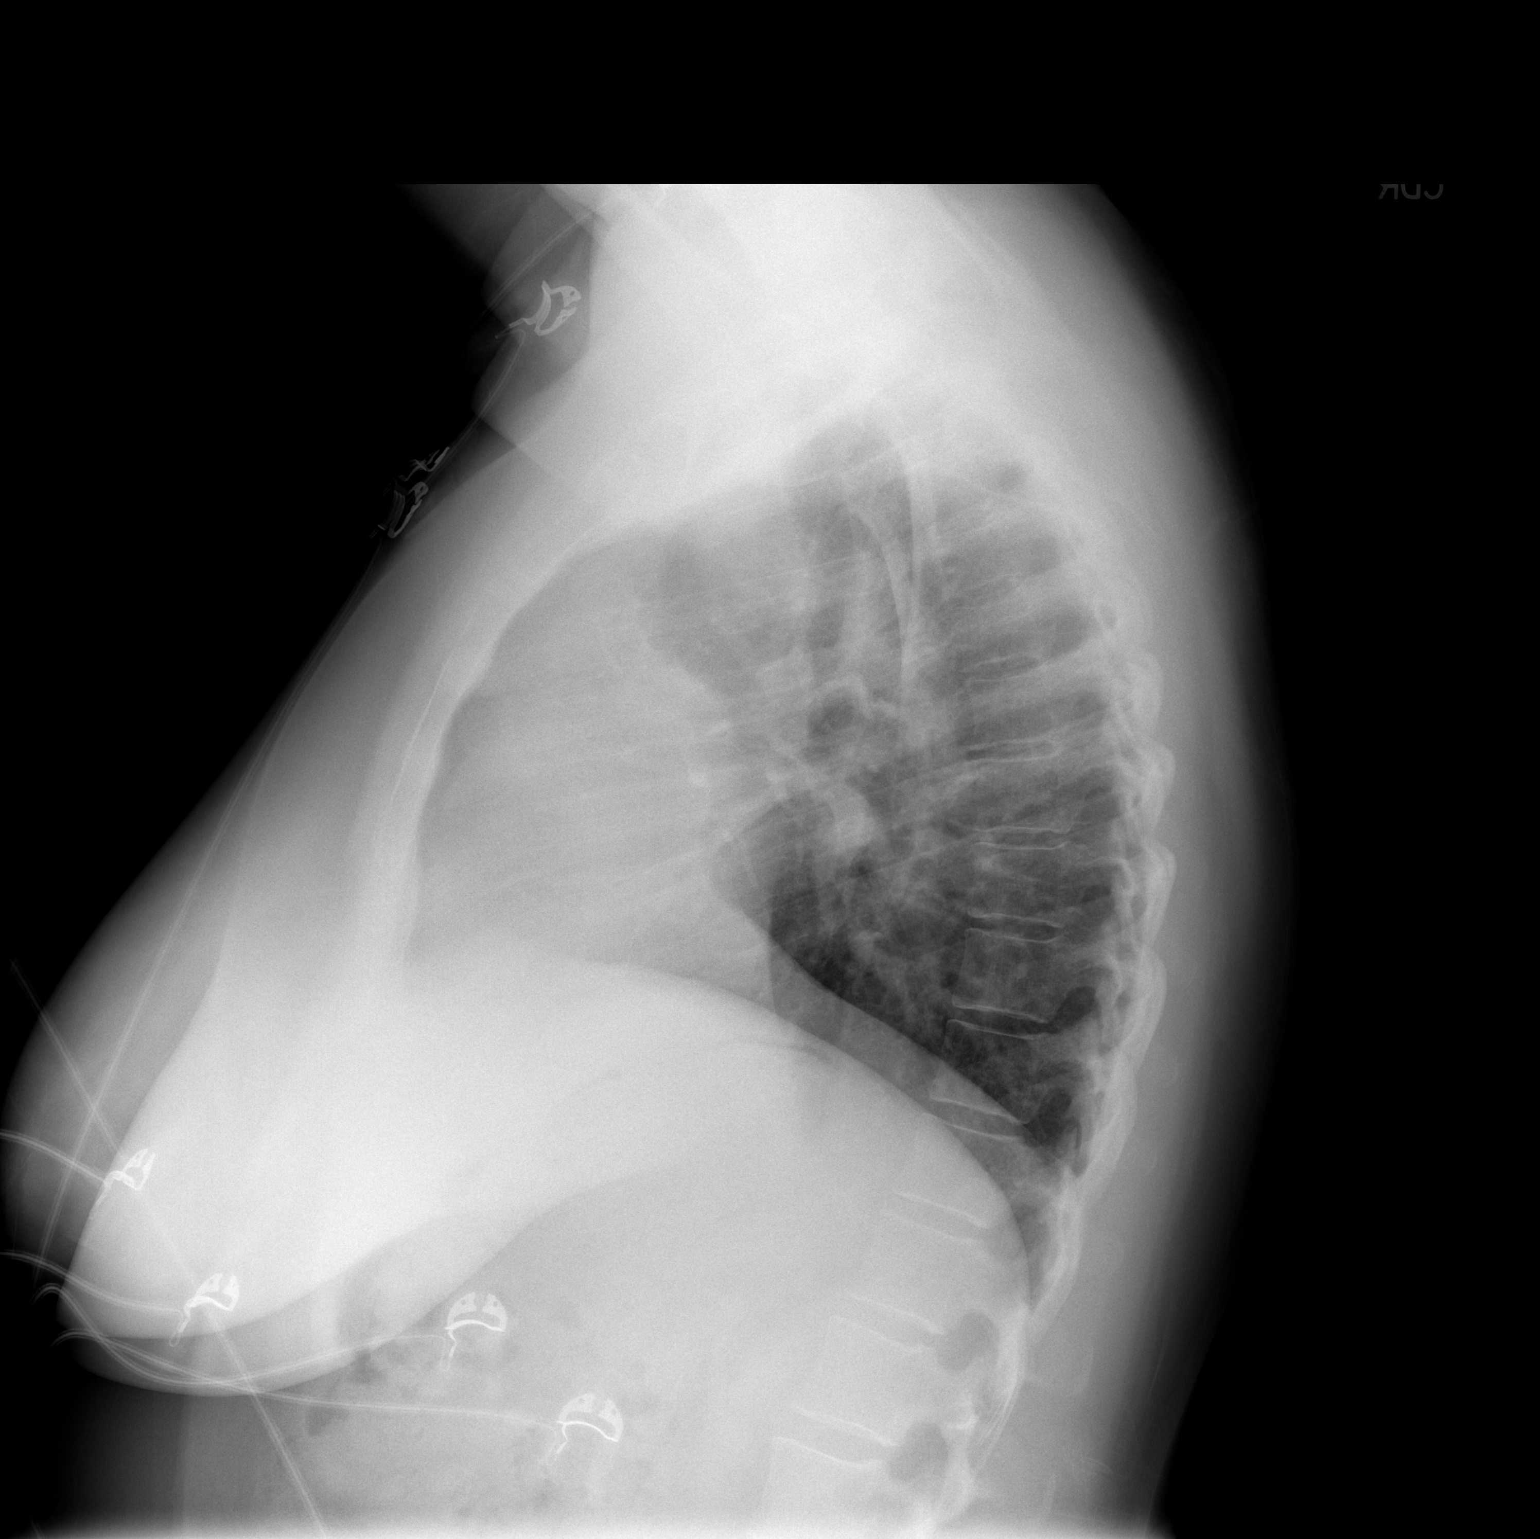

[2 of 2 positions shown; findings below may reference images not displayed]

FINDINGS: The left lung is clear. There is mild shift of mediastinal contents
to the right suggesting volume loss. There is opacity in the right
middle and lower lobes which may be due to atelectasis or
infiltrate. Possible tiny right pleural effusion or thickening. No
pneumothorax.
IMPRESSION: Abnormal opacity in the right middle and lower lobes with suggested
volume loss and mediastinal shift to the right, findings could be
due to atelectasis or possibly an infiltrate. No prior radiographs
to indicate if this is a chronic or acute finding.

## 2018-08-20 ENCOUNTER — Telehealth: Payer: Self-pay | Admitting: *Deleted

## 2020-12-26 LAB — HM PAP SMEAR: HM Pap smear: NEGATIVE

## 2021-11-21 ENCOUNTER — Ambulatory Visit
Admission: EM | Admit: 2021-11-21 | Discharge: 2021-11-21 | Disposition: A | Discharge status: Home / Self Care | Payer: BC Managed Care – PPO | Attending: Family Medicine | Admitting: Family Medicine

## 2021-11-21 ENCOUNTER — Encounter: Payer: Self-pay | Admitting: Emergency Medicine

## 2021-11-21 DIAGNOSIS — L255 Unspecified contact dermatitis due to plants, except food: Secondary | ICD-10-CM | POA: Diagnosis not present

## 2021-11-21 MED ORDER — PREDNISONE 20 MG PO TABS: 40.0000 mg | ORAL_TABLET | Freq: Every day | ORAL | 0 refills | Status: AC

## 2021-11-21 MED ORDER — TRIAMCINOLONE ACETONIDE 0.1 % EX CREA: 1.0000 "application " | TOPICAL_CREAM | Freq: Two times a day (BID) | CUTANEOUS | 0 refills | Status: DC

## 2021-11-21 NOTE — ED Triage Notes (Signed)
Pt is present today with a rash on her lower right ankle. Pt states that she noticed it x3 days ago. PT states that she may have come in contact with posion ivy ?

## 2021-11-21 NOTE — ED Provider Notes (Signed)
?[facility] ? ? ? ?CSN: [id number] ?Arrival date & time: 11/21/21  1325 ? ? ?  ? ?History   ?Chief Complaint ?Chief Complaint  ?Patient presents with  ? Rash  ? ? ?HPI ?Faith Evans is a 41 y.o. female.  ? ? ?Rash ?Here with a rash for the last 3 days or so on her right medial ankle.  It is very pruritic.  She is pretty confident that is due to poison ivy.  She states that often she will have various other spots show up after the first part of the rash shows up.  No fever or chills or dyspnea. ? ?Past Medical History:  ?Diagnosis Date  ? GERD (gastroesophageal reflux disease)   ? ? ?There are no problems to display for this patient. ? ? ?History reviewed. No pertinent surgical history. ? ?OB History   ?No obstetric history on file. ?  ? ? ? ?Home Medications   ? ?Prior to Admission medications   ?Medication Sig Start Date End Date Taking? Authorizing Provider  ?predniSONE (DELTASONE) 20 MG tablet Take 2 tablets (40 mg total) by mouth daily with breakfast for 5 days. 11/21/21 11/26/21 Yes Barrett Henle, MD  ?triamcinolone cream (KENALOG) 0.1 % Apply 1 application. topically 2 (two) times daily. To affected area till better 11/21/21  Yes [place] Acklin, Gwenlyn Perking, MD  ?pantoprazole (PROTONIX) 40 MG tablet Take 40 mg by mouth daily.    [provider]  ?ranitidine (ZANTAC) 150 MG tablet Take 150 mg by mouth daily as needed for heartburn.    [provider]  ? ? ?Family History ?History reviewed. No pertinent family history. ? ?Social History ?  ? ? ?Allergies   ?Amoxicillin ? ? ?Review of Systems ?Review of Systems  ?Skin:  Positive for rash.  ? ? ?Physical Exam ?Triage Vital Signs ?ED Triage Vitals  ?Enc Vitals Group  ?   BP 11/21/21 1443 (!) 152/96  ?   Pulse Rate 11/21/21 1443 83  ?   Resp 11/21/21 1443 17  ?   Temp 11/21/21 1443 98 ?F (36.7 ?C)  ?   Temp Source 11/21/21 1443 Oral  ?   SpO2 11/21/21 1443 94 %  ?   Weight --   ?   Height --   ?   Head Circumference --   ?   Peak Flow --   ?    Pain Score 11/21/21 1445 2  ?   Pain Loc --   ?   Pain Edu? --   ?   Excl. in [place]? --   ? ?No data found. ? ?Updated Vital Signs ?BP (!) 152/96   Pulse 83   Temp 98 ?F (36.7 ?C) (Oral)   Resp 17   SpO2 94%  ? ?Visual Acuity ?Right Eye Distance:   ?Left Eye Distance:   ?Bilateral Distance:   ? ?Right Eye Near:   ?Left Eye Near:    ?Bilateral Near:    ? ?Physical Exam ?Vitals reviewed.  ?Constitutional:   ?   General: She is not in acute distress. ?   Appearance: She is not toxic-appearing.  ?HENT:  ?   Mouth/Throat:  ?   Mouth: Mucous membranes are moist.  ?Eyes:  ?   Extraocular Movements: Extraocular movements intact.  ?Skin: ?   Findings: Rash (Maculopapular rash with some vesicles on her right medial ankle it extends about 2 cm x 1-1/2 cm) present.  ?Neurological:  ?   Mental Status: She  is oriented to person, place, and time.  ?Psychiatric:     ?   Behavior: Behavior normal.  ? ? ? ?UC Treatments / Results  ?Labs ?(all labs ordered are listed, but only abnormal results are displayed) ?Labs Reviewed - No data to display ? ?EKG ? ? ?Radiology ?No results found. ? ?Procedures ?Procedures (including critical care time) ? ?Medications Ordered in UC ?Medications - No data to display ? ?Initial Impression / Assessment and Plan / UC Course  ?I have reviewed the triage vital signs and the nursing notes. ? ?Pertinent labs & imaging results that were available during my care of the patient were reviewed by me and considered in my medical decision making (see chart for details). ? ?  ? ?We will treat with oral prednisone (she declined an injection) and with topical steroids. ?Final Clinical Impressions(s) / UC Diagnoses  ? ?Final diagnoses:  ?Contact dermatitis due to plants, except food, unspecified contact dermatitis type  ? ? ? ?Discharge Instructions   ? ?  ?Take prednisone 20 mg--2 daily for 5 days ? ?Use triamcinolone cream 0.1%--apply twice daily to the rash area until improved ? ? ? ? ? ? ?ED Prescriptions   ? ?  Medication Sig Dispense Auth. Provider  ? predniSONE (DELTASONE) 20 MG tablet Take 2 tablets (40 mg total) by mouth daily with breakfast for 5 days. 10 tablet Barrett Henle, MD  ? triamcinolone cream (KENALOG) 0.1 % Apply 1 application. topically 2 (two) times daily. To affected area till better 80 g Barrett Henle, MD  ? ?  ? ?PDMP not reviewed this encounter. ?  ?Barrett Henle, MD ?11/21/21 1504 ? ?

## 2021-11-21 NOTE — Discharge Instructions (Addendum)
Take prednisone 20 mg--2 daily for 5 days ? ?Use triamcinolone cream 0.1%--apply twice daily to the rash area until improved ? ? ?

## 2022-03-08 ENCOUNTER — Telehealth: Payer: Self-pay | Admitting: General Practice

## 2022-03-08 NOTE — Telephone Encounter (Signed)
LVM for PT inquiring on their referral to establish care with [place].

## 2022-03-14 LAB — HM MAMMOGRAPHY

## 2022-03-18 LAB — HM MAMMOGRAPHY

## 2022-04-26 ENCOUNTER — Ambulatory Visit: Payer: BC Managed Care – PPO | Admitting: Nurse Practitioner

## 2022-04-26 ENCOUNTER — Encounter: Payer: Self-pay | Admitting: Nurse Practitioner

## 2022-04-26 VITALS — BP 140/84 | HR 87 | Temp 98.4°F | Ht <= 58 in | Wt 170.2 lb

## 2022-04-26 DIAGNOSIS — E669 Obesity, unspecified: Secondary | ICD-10-CM | POA: Diagnosis not present

## 2022-04-26 DIAGNOSIS — E559 Vitamin D deficiency, unspecified: Secondary | ICD-10-CM | POA: Diagnosis not present

## 2022-04-26 DIAGNOSIS — R030 Elevated blood-pressure reading, without diagnosis of hypertension: Secondary | ICD-10-CM

## 2022-04-26 DIAGNOSIS — Z6835 Body mass index (BMI) 35.0-35.9, adult: Secondary | ICD-10-CM

## 2022-04-26 DIAGNOSIS — R21 Rash and other nonspecific skin eruption: Secondary | ICD-10-CM | POA: Insufficient documentation

## 2022-04-26 DIAGNOSIS — Z13220 Encounter for screening for lipoid disorders: Secondary | ICD-10-CM | POA: Insufficient documentation

## 2022-04-26 DIAGNOSIS — E785 Hyperlipidemia, unspecified: Secondary | ICD-10-CM

## 2022-04-26 NOTE — Assessment & Plan Note (Signed)
Blood pressure elevated and remained elevated after resting in the room for a few minutes.  She does not currently have a history of hypertension.  Will not initiate antihypertensive at this time, but will have low threshold to initiate if blood pressure mains elevated between now and next appointment.

## 2022-04-26 NOTE — Assessment & Plan Note (Signed)
Patient to continue vitamin D3 supplementation, consider checking serum level at next office visit.

## 2022-04-26 NOTE — Assessment & Plan Note (Signed)
Etiology unclear, intermittent and chronic.  For now recommend using triamcinolone cream topically twice a day up to 2 weeks to see if this improves symptoms.  If this persist despite this treatment may consider trialing clindamycin versus referral to dermatology.  Patient reports understanding.

## 2022-04-26 NOTE — Patient Instructions (Signed)
My fitness pal - free app to count calories. Eat 1100-1200 calories a day. Focus mainly only lean proteins, vegetables, and berries Drink 80 ounces a day

## 2022-04-26 NOTE — Assessment & Plan Note (Signed)
Patient is needle phobic and would not be a candidate for GLP-1's due to this.  Recommend continuing with lifestyle changes at this time.  Recommend 1100 to 1200 cal/day, 80 ounces of water per day, focus on intake of lean proteins and vegetables.  Avoiding starches sugary foods and sugary beverages as well as processed foods.  Patient will return in approximately 1 month to see if she has made progress.  May consider other pharmacological treatment such as phentermine in the future if needed.

## 2022-04-26 NOTE — Assessment & Plan Note (Signed)
We will focus on lifestyle changes initially to see if this helps with her cholesterol levels.  Recommend dietary changes by focusing on leafy greens, lean proteins, other vegetables, and berries.  Also focus on proper hydration with water.  Recommended 150 minutes of exercise per week.

## 2022-04-26 NOTE — Progress Notes (Signed)
New Patient Office Visit  Subjective    Patient ID: Faith Evans, female    DOB: 1981-05-10  Age: 41 y.o. MRN: [id number]  CC:  Chief Complaint  Patient presents with   New Patient (Initial Visit)    Want to talk about weight (management)    HPI Faith Evans presents to establish care She is here to establish care with PCP. Historically she has been to her OBGYN, but has never established with primary care.  She does show me previous medical records from OB/GYN regarding recent blood work.  She had labs checked on 03/06/2022 that showed TSH of 2.13 and T4 of 1.19, hemoglobin A1c of 5.4, CBC which was in normal limits, CMP all within normal limits with EGFR of 107, lipid panel showed total cholesterol 206, triglycerides 129, HDL 38, LDL 145, vitamin D of 22.8.  Since that appointment she has been taking vitamin D3 5000 IUs by mouth multiple times a week.  Her main concerns today include rash and obesity. Rash: She has a rash that comes and goes to her left armpit it has been intermittently present for years.  She has tried changing different deodorants and using sensitive skin urines without improvement.  She reports that it does itch.  She has not noted any obvious triggers.  She would like to have this evaluated today. Obesity: She reports that she has been trying to lose weight.  Over the last 2 years despite being more active and eating less than usual she continues to gain weight.  She did note that she was snacking frequently at work in the past and so she has now stopped snacking, additionally she participates in intermittent fasting and only eats lunch and dinner most of the time.  She also has increased her water intake.  She reports that she does not have a menstrual cycle currently secondary to IUD, when she did have a menstrual cycle it was regular.  She denies being especially prone to acne.  She does report some mild hirsutism with some hair growth on her chin but denies any hair  growth on her abdomen or chest.  Last mammogram was completed 03/14/2022 and no signs of malignancy noted.  Outpatient Encounter Medications as of 04/26/2022  Medication Sig   Cholecalciferol 125 MCG (5000 UT) capsule Take 5,000 Units by mouth daily.   levocetirizine (XYZAL) 5 MG tablet Take 5 mg by mouth daily.   levonorgestrel (KYLEENA) 19.5 MG IUD Take by intrauterine route.   pantoprazole (PROTONIX) 40 MG tablet Take 40 mg by mouth daily.   triamcinolone cream (KENALOG) 0.1 % Apply 1 application. topically 2 (two) times daily. To affected area till better   albuterol (VENTOLIN HFA) 108 (90 Base) MCG/ACT inhaler albuterol sulfate HFA 90 mcg/actuation aerosol inhaler (Patient not taking: Reported on 04/26/2022)   [DISCONTINUED] ranitidine (ZANTAC) 150 MG tablet Take 150 mg by mouth daily as needed for heartburn. (Patient not taking: Reported on 04/26/2022)   No facility-administered encounter medications on file as of 04/26/2022.    Past Medical History:  Diagnosis Date   GERD (gastroesophageal reflux disease)     Past Surgical History:  Procedure Laterality Date   CESAREAN SECTION      Family History  Problem Relation Age of Onset   Cancer Mother    Seizures Father    Cancer Maternal Grandfather     Social History   Socioeconomic History   Marital status: Married    Spouse name: Not on file  Number of children: Not on file   Years of education: Not on file   Highest education level: Not on file  Occupational History   Not on file  Tobacco Use   Smoking status: Never   Smokeless tobacco: Never  Substance and Sexual Activity   Alcohol use: Yes    Comment: occasionally   Drug use: Never   Sexual activity: Yes  Other Topics Concern   Not on file  Social History Narrative   Not on file   Social Determinants of Health   Financial Resource Strain: Not on file  Food Insecurity: Not on file  Transportation Needs: Not on file  Physical Activity: Not on file  Stress:  Not on file  Social Connections: Not on file  Intimate Partner Violence: Not on file    Review of Systems  Constitutional:  Negative for fever and malaise/fatigue.  Respiratory:  Positive for shortness of breath (with exertion).   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 Negative for chest pain.        Objective    BP (!) 140/84   Pulse 87   Temp 98.4 F (36.9 C) (Oral)   Ht 4' 10"  (1.473 m)   Wt 170 lb 4 oz (77.2 kg)   SpO2 94%   BMI 35.58 kg/m   Physical Exam Vitals reviewed.  Constitutional:      General: She is not in acute distress.    Appearance: Normal appearance.  HENT:     Head: Normocephalic and atraumatic.  Neck:     Vascular: No carotid bruit.  Cardiovascular:     Rate and Rhythm: Normal rate and regular rhythm.     Pulses: Normal pulses.     Heart sounds: Normal heart sounds.  Pulmonary:     Effort: Pulmonary effort is normal.     Breath sounds: Normal breath sounds.  Skin:    General: Skin is warm and dry.       Neurological:     General: No focal deficit present.     Mental Status: She is alert and oriented to person, place, and time.  Psychiatric:        Mood and Affect: Mood normal.        Behavior: Behavior normal.        Judgment: Judgment normal.         Assessment & Plan:   Problem List Items Addressed This Visit       Musculoskeletal and Integument   Rash    Etiology unclear, intermittent and chronic.  For now recommend using triamcinolone cream topically twice a day up to 2 weeks to see if this improves symptoms.  If this persist despite this treatment may consider trialing clindamycin versus referral to dermatology.  Patient reports understanding.        Other   Class 2 obesity with body mass index (BMI) of 35.0 to 35.9 in adult - Primary    Patient is needle phobic and would not be a  candidate for GLP-1's due to this.  Recommend continuing with lifestyle changes at this time.  Recommend 1100 to 1200 cal/day, 80 ounces of water per day, focus on intake of lean proteins and vegetables.  Avoiding starches sugary foods and sugary beverages as well as processed foods.  Patient will return in approximately 1 month to see if she has made progress.  May  consider other pharmacological treatment such as phentermine in the future if needed.      Vitamin D deficiency    Patient to continue vitamin D3 supplementation, consider checking serum level at next office visit.      Elevated blood pressure reading    Blood pressure elevated and remained elevated after resting in the room for a few minutes.  She does not currently have a history of hypertension.  Will not initiate antihypertensive at this time, but will have low threshold to initiate if blood pressure mains elevated between now and next appointment.      Hyperlipidemia    We will focus on lifestyle changes initially to see if this helps with her cholesterol levels.  Recommend dietary changes by focusing on leafy greens, lean proteins, other vegetables, and berries.  Also focus on proper hydration with water.  Recommended 150 minutes of exercise per week.       Return in about 1 month (around 05/26/2022) for F/u with Judson Roch.  I spent a total of 48 minutes on this encounter today, time was spent on face-to-face interaction with patient, chart review, and formulation of plan as well as education regarding lifestyle.  Ailene Ards, NP

## 2022-05-30 ENCOUNTER — Ambulatory Visit (INDEPENDENT_AMBULATORY_CARE_PROVIDER_SITE_OTHER): Payer: BC Managed Care – PPO | Admitting: Nurse Practitioner

## 2022-05-30 VITALS — BP 134/80 | HR 72 | Temp 98.6°F | Ht <= 58 in | Wt 165.0 lb

## 2022-05-30 DIAGNOSIS — Z6835 Body mass index (BMI) 35.0-35.9, adult: Secondary | ICD-10-CM | POA: Diagnosis not present

## 2022-05-30 DIAGNOSIS — E669 Obesity, unspecified: Secondary | ICD-10-CM | POA: Diagnosis not present

## 2022-05-30 DIAGNOSIS — R6881 Early satiety: Secondary | ICD-10-CM | POA: Diagnosis not present

## 2022-05-30 NOTE — Assessment & Plan Note (Signed)
Etiology unclear, previous lab work at previous institution showed normal thyroid testing, normal A1c, normal CBC, normal CMP with GFR 107, cholesterol slightly elevated.  Recommend considering evaluation with gastroenterology, however patient would prefer to defer this for now.  She does report family history of gallstones, not experiencing abdominal pain currently however we will order abdominal ultrasound for further evaluation.  May consider referral to GI pending those results.

## 2022-05-30 NOTE — Progress Notes (Signed)
Established Patient Office Visit  Subjective   Patient ID: EMNET MONK, female    DOB: 1981-08-05  Age: 41 y.o. MRN: [id number]  Chief Complaint  Patient presents with   Anorexia    Patient has been trying to lose weight, reports that she will have early satiety at times but not always.  She has not made much change to her diet since last time I saw her, however has lost weight.  She has no other concerns today.    Review of Systems  Constitutional:  Positive for malaise/fatigue (depends on the day). Negative for diaphoresis and fever.  Respiratory:  Negative for shortness of breath.   Cardiovascular:  Negative for chest pain.  Gastrointestinal:  Negative for abdominal pain ((+) occasional abdominal cramps thinks this may be related to menstrual cycle), blood in stool, constipation, nausea and vomiting.       (+) intermittent early satiety  Psychiatric/Behavioral:  Negative for depression. The patient is not nervous/anxious and does not have insomnia.       Objective:     BP 134/80   Pulse 72   Temp 98.6 F (37 C) (Oral)   Ht '4\' 10"'$  (1.473 m)   Wt 165 lb (74.8 kg)   SpO2 98%   BMI 34.49 kg/m  BP Readings from Last 3 Encounters:  05/30/22 134/80  04/26/22 (!) 140/84  11/21/21 (!) 152/96   Wt Readings from Last 3 Encounters:  05/30/22 165 lb (74.8 kg)  04/26/22 170 lb 4 oz (77.2 kg)  01/19/17 150 lb (68 kg)        05/30/2022    4:33 PM 04/26/2022    3:51 PM  PHQ9 SCORE ONLY  PHQ-9 Total Score 0 0     Physical Exam Vitals reviewed.  Constitutional:      General: She is not in acute distress.    Appearance: Normal appearance.  HENT:     Head: Normocephalic and atraumatic.  Neck:     Vascular: No carotid bruit.  Cardiovascular:     Rate and Rhythm: Normal rate and regular rhythm.     Pulses: Normal pulses.     Heart sounds: Normal heart sounds.  Pulmonary:     Effort: Pulmonary effort is normal.     Breath sounds: Normal breath sounds.  Skin:     General: Skin is warm and dry.  Neurological:     General: No focal deficit present.     Mental Status: She is alert and oriented to person, place, and time.  Psychiatric:        Mood and Affect: Mood normal.        Behavior: Behavior normal.        Judgment: Judgment normal.      No results found for any visits on 05/30/22.    The ASCVD Risk score (Arnett DK, et al., 2019) failed to calculate for the following reasons:   Cannot find a previous HDL lab   Cannot find a previous total cholesterol lab    Assessment & Plan:   Problem List Items Addressed This Visit       Other   Class 2 obesity with body mass index (BMI) of 35.0 to 35.9 in adult    Chronic, encourage patient to continue focusing on lifestyle modification aimed at helping her with weight loss such as calorie deficit increasing physical activity to at least 150 minutes/week.      Early satiety - Primary    Etiology unclear, previous  lab work at previous institution showed normal thyroid testing, normal A1c, normal CBC, normal CMP with GFR 107, cholesterol slightly elevated.  Recommend considering evaluation with gastroenterology, however patient would prefer to defer this for now.  She does report family history of gallstones, not experiencing abdominal pain currently however we will order abdominal ultrasound for further evaluation.  May consider referral to GI pending those results.      Relevant Orders   US Abdomen Complete    Return in about 2 months (around 07/30/2022) for CPE with Judson Roch.    Ailene Ards, NP

## 2022-05-30 NOTE — Assessment & Plan Note (Signed)
Chronic, encourage patient to continue focusing on lifestyle modification aimed at helping her with weight loss such as calorie deficit increasing physical activity to at least 150 minutes/week.

## 2022-06-06 ENCOUNTER — Ambulatory Visit
Admission: RE | Admit: 2022-06-06 | Discharge: 2022-06-06 | Disposition: A | Discharge status: Home / Self Care | Payer: BC Managed Care – PPO | Source: Ambulatory Visit | Attending: Nurse Practitioner | Admitting: Nurse Practitioner

## 2022-06-06 DIAGNOSIS — R6881 Early satiety: Secondary | ICD-10-CM

## 2022-06-08 ENCOUNTER — Other Ambulatory Visit: Payer: Self-pay | Admitting: Nurse Practitioner

## 2022-06-08 DIAGNOSIS — R6881 Early satiety: Secondary | ICD-10-CM

## 2022-06-08 DIAGNOSIS — K760 Fatty (change of) liver, not elsewhere classified: Secondary | ICD-10-CM

## 2022-06-08 DIAGNOSIS — K8020 Calculus of gallbladder without cholecystitis without obstruction: Secondary | ICD-10-CM

## 2022-06-17 ENCOUNTER — Encounter: Payer: Self-pay | Admitting: Nurse Practitioner

## 2022-07-26 ENCOUNTER — Encounter: Payer: Self-pay | Admitting: Nurse Practitioner

## 2022-07-26 ENCOUNTER — Ambulatory Visit: Payer: BC Managed Care – PPO | Admitting: Nurse Practitioner

## 2022-07-26 ENCOUNTER — Other Ambulatory Visit (INDEPENDENT_AMBULATORY_CARE_PROVIDER_SITE_OTHER): Payer: BC Managed Care – PPO

## 2022-07-26 VITALS — BP 142/86 | HR 75 | Ht <= 58 in | Wt 162.2 lb

## 2022-07-26 DIAGNOSIS — K8020 Calculus of gallbladder without cholecystitis without obstruction: Secondary | ICD-10-CM

## 2022-07-26 DIAGNOSIS — Z800 Family history of malignant neoplasm of digestive organs: Secondary | ICD-10-CM | POA: Diagnosis not present

## 2022-07-26 DIAGNOSIS — K760 Fatty (change of) liver, not elsewhere classified: Secondary | ICD-10-CM | POA: Diagnosis not present

## 2022-07-26 DIAGNOSIS — K219 Gastro-esophageal reflux disease without esophagitis: Secondary | ICD-10-CM | POA: Diagnosis not present

## 2022-07-26 LAB — CBC WITH DIFFERENTIAL/PLATELET
Basophils Absolute: 0.1 10*3/uL (ref 0.0–0.1)
Basophils Relative: 0.8 % (ref 0.0–3.0)
Eosinophils Absolute: 0.1 10*3/uL (ref 0.0–0.7)
Eosinophils Relative: 0.5 % (ref 0.0–5.0)
HCT: 45.4 % (ref 36.0–46.0)
Hemoglobin: 15.1 g/dL — ABNORMAL HIGH (ref 12.0–15.0)
Lymphocytes Relative: 28.9 % (ref 12.0–46.0)
Lymphs Abs: 2.8 10*3/uL (ref 0.7–4.0)
MCHC: 33.3 g/dL (ref 30.0–36.0)
MCV: 93.1 fl (ref 78.0–100.0)
Monocytes Absolute: 0.6 10*3/uL (ref 0.1–1.0)
Monocytes Relative: 5.7 % (ref 3.0–12.0)
Neutro Abs: 6.3 10*3/uL (ref 1.4–7.7)
Neutrophils Relative %: 64.1 % (ref 43.0–77.0)
Platelets: 268 10*3/uL (ref 150.0–400.0)
RBC: 4.87 Mil/uL (ref 3.87–5.11)
RDW: 13.6 % (ref 11.5–15.5)
WBC: 9.8 10*3/uL (ref 4.0–10.5)

## 2022-07-26 LAB — COMPREHENSIVE METABOLIC PANEL
ALT: 27 U/L (ref 0–35)
AST: 17 U/L (ref 0–37)
Albumin: 4.8 g/dL (ref 3.5–5.2)
Alkaline Phosphatase: 72 U/L (ref 39–117)
BUN: 10 mg/dL (ref 6–23)
CO2: 31 mEq/L (ref 19–32)
Calcium: 9.6 mg/dL (ref 8.4–10.5)
Chloride: 101 mEq/L (ref 96–112)
Creatinine, Ser: 0.73 mg/dL (ref 0.40–1.20)
GFR: 102.85 mL/min (ref >60.00)
Glucose, Bld: 97 mg/dL (ref 70–99)
Potassium: 3.7 mEq/L (ref 3.5–5.1)
Sodium: 140 mEq/L (ref 135–145)
Total Bilirubin: 0.5 mg/dL (ref 0.2–1.2)
Total Protein: 7.8 g/dL (ref 6.0–8.3)

## 2022-07-26 LAB — LIPASE: Lipase: 12 U/L (ref 11.0–59.0)

## 2022-07-26 LAB — C-REACTIVE PROTEIN: CRP: 1.4 mg/dL (ref 0.5–20.0)

## 2022-07-26 MED ORDER — FAMOTIDINE 20 MG PO TABS: 20.0000 mg | ORAL_TABLET | Freq: Every day | ORAL | 0 refills | Status: DC

## 2022-07-26 MED ORDER — PANTOPRAZOLE SODIUM 40 MG PO TBEC: 40.0000 mg | DELAYED_RELEASE_TABLET | Freq: Every day | ORAL | 1 refills | Status: DC

## 2022-07-26 MED ORDER — NA SULFATE-K SULFATE-MG SULF 17.5-3.13-1.6 GM/177ML PO SOLN: 1.0000 | Freq: Once | ORAL | 0 refills | Status: AC

## 2022-07-26 NOTE — Patient Instructions (Addendum)
You have been scheduled for an endoscopy and colonoscopy. Please follow the written instructions given to you at your visit today. Please pick up your prep supplies at the pharmacy within the next 1-3 days. If you use inhalers (even only as needed), please bring them with you on the day of your procedure.   Your provider has requested that you go to the basement level for lab work before leaving today. Press "B" on the elevator. The lab is located at the first door on the left as you exit the elevator.   Benefiber- 1 tablespoon daily  We have sent the following medications to your pharmacy for you to pick up at your convenience: Famotidine 20 mg  Due to recent changes in healthcare laws, you may see the results of your imaging and laboratory studies on MyChart before your provider has had a chance to review them.  We understand that in some cases there may be results that are confusing or concerning to you. Not all laboratory results come back in the same time frame and the provider may be waiting for multiple results in order to interpret others.  Please give [place] 48 hours in order for your provider to thoroughly review all the results before contacting the office for clarification of your results.    Thank you for trusting me with your gastrointestinal care!   Carl Best, CRNP

## 2022-07-26 NOTE — Progress Notes (Signed)
07/26/2022 Faith Evans [id number] [birth year]   CHIEF COMPLAINT: Acid reflux, fatty liver and gallstones  HISTORY OF PRESENT ILLNESS: Faith Evans is a 41 year old female with a past medical history of GERD, kidney stones, preeclampsia, help syndrome and C-section 07/2006. She presents to our office today as referred by [name] NP for further evaluation regarding early satiety, hepatic steatosis and gallstones.   She describes having heartburn for a long time, at least since 2017. She takes Pantoprazole '40mg'$  QD at bed time since 2017.  She occasionally takes a second Pantoprazole as needed.  She has increased burping and pain in her throat at times.  She has intermittent mild pill dysphagia.  No difficulty swallowing liquids or solids.  She complains of having nausea, feels like she might throw up but does not.  She has early satiety, she feels full after a few bites.  She has lost 10 pounds over the past few months.  He was seen by her primary care provider [name], NP ordered an abdominal sonogram which was completed 06/07/2022 which identified cholelithiasis without evidence of acute cystitis and hepatic steatosis was noted.  Her bowel pattern varies, she can pass a soft and loose stool on the same day.  No black stools or rectal bleeding.  No recent labs found in epic or Care Everywhere.  Complete abdominal sono 06/07/2022: FINDINGS: Gallbladder: Gallstones are noted with the largest measuring 1.6 cm in diameter. No gallbladder wall thickening visualized. No sonographic Murphy sign noted by sonographer.   Common bile duct: Diameter: 3 mm   Liver: No focal lesion identified. Increased parenchymal echogenicity. Portal vein is patent on color Doppler imaging with normal direction of blood flow towards the liver.   IVC: No abnormality visualized.   Pancreas: Visualized portion unremarkable.   Spleen: Size and appearance within normal limits.   Right Kidney: Length: 11.3  cm. Echogenicity within normal limits. No mass or hydronephrosis visualized.   Left Kidney: Length: 11.1 cm. Echogenicity within normal limits. No mass or hydronephrosis visualized.   Abdominal aorta: No aneurysm visualized.   Other findings: None.   IMPRESSION: 1. Cholelithiasis without evidence of acute cholecystitis. 2. Increased liver echogenicity is consistent with hepatic steatosis.   Past Medical History:  Diagnosis Date   GERD (gastroesophageal reflux disease)    Past Surgical History:  Procedure Laterality Date   CESAREAN SECTION     Social History: She is married.  She has 1 son.  She is a Estate agent.  Non-smoker.  She drink one beer or 4 ounces of whiskey once daily or less. No drug use.   Family History: Mother was diagnosed with colon cancer in her late 34's or age 1. Father with history of seizure disorder and CVA. Maternal grandfather had lung cancer, he was a smoker. Paternal grandmother had a blood cancer.   Allergies  Allergen Reactions   Amoxicillin Rash   Cinnamon Rash     Outpatient Encounter Medications as of 07/26/2022  Medication Sig   albuterol (VENTOLIN HFA) 108 (90 Base) MCG/ACT inhaler    Cholecalciferol 125 MCG (5000 UT) capsule Take 5,000 Units by mouth daily.   levocetirizine (XYZAL) 5 MG tablet Take 5 mg by mouth daily.   levonorgestrel (KYLEENA) 19.5 MG IUD Take by intrauterine route.   pantoprazole (PROTONIX) 40 MG tablet Take 40 mg by mouth daily.   triamcinolone cream (KENALOG) 0.1 % Apply 1 application. topically 2 (two) times daily. To affected area till better  No facility-administered encounter medications on file as of 07/26/2022.   REVIEW OF SYSTEMS:  Gen: See HPI. CV: Denies chest pain, palpitations or edema. Resp: Denies cough, shortness of breath of hemoptysis.  GI: Denies heartburn, dysphagia, stomach or lower abdominal pain. No diarrhea or constipation.  GU : Denies urinary burning, blood in urine, increased urinary frequency  or incontinence. MS: + Back pain.  Derm: Denies rash, itchiness, skin lesions or unhealing ulcers. Psych: Denies depression, anxiety, memory loss, suicidal ideation and confusion. Heme: Denies bruising, easy bleeding. Neuro:  Denies headaches, dizziness or paresthesias. Endo:  Denies any problems with DM, thyroid or adrenal function.  PHYSICAL EXAM: BP (!) 142/86   Pulse 75   Ht '4\' 10"'$  (1.473 m)   Wt 162 lb 4 oz (73.6 kg)   BMI 33.91 kg/m    Wt Readings from Last 3 Encounters:  05/30/22 165 lb (74.8 kg)  04/26/22 170 lb 4 oz (77.2 kg)  01/19/17 150 lb (68 kg)    General: 41 year old female in no acute distress. Head: Normocephalic and atraumatic. Eyes:  Sclerae non-icteric, conjunctive pink. Ears: Normal auditory acuity. Mouth: Dentition intact. No ulcers or lesions.  Neck: Supple, no lymphadenopathy or thyromegaly.  Lungs: Clear bilaterally to auscultation without wheezes, crackles or rhonchi. Heart: Regular rate and rhythm. No murmur, rub or gallop appreciated.  Abdomen: Soft, nontender, non distended. No masses. No hepatosplenomegaly. Normoactive bowel sounds x 4 quadrants.  Rectal: Deferred. Musculoskeletal: Symmetrical with no gross deformities. Skin: Warm and dry. No rash or lesions on visible extremities. Extremities: No edema. Neurological: Alert oriented x 4, no focal deficits.  Psychological:  Alert and cooperative. Normal mood and affect.  ASSESSMENT AND PLAN:  69) 41 year old female with chronic GERD symptoms, early satiety and weight loss -Pantoprazole 40 mg 1 p.o. to be taken 30 minutes before dinner -Famotidine 20 mg 1 p.o. nightly -GERD diet handout EGD benefits and risks discussed including risk with sedation, risk of bleeding, perforation and infection   2) Altered bowel pattern -Benefiber 1 tablespoon daily as tolerated  3) First-degree relative (mother) with history of colon cancer diagnosed in her late 16s or at the age of 67. -Colonoscopy  benefits and risks discussed including risk with sedation, risk of bleeding, perforation and infection   4) Cholelithiasis confirmed per abdominal sonogram 08/2021, the largest measuring 1.6 cm without evidence of acute cholecystitis or CBD dilatation. -CBC, CMP, CRP and lipase level -Patient declines need for antiemetic at this time -She may require referral to see general surgery for an elective cholecystectomy if her symptoms worsen and if EGD unrevealing  5) Hepatic steatosis -Labs as ordered above -Patient encouraged to reduce carbohydrates in the diet i.e. bread/pasta/rice/potatoes/sweets, exercise as tolerated and lose 10 pounds to reduce the risk of developing fatty liver disease  Further recommendations to be determined after EGD and colonoscopy completed        CC:  Ailene Ards, NP

## 2022-07-27 LAB — TISSUE TRANSGLUTAMINASE ABS,IGG,IGA
(tTG) Ab, IgA: 1.4 U/mL
(tTG) Ab, IgG: <1 U/mL

## 2022-07-27 LAB — IGA: Immunoglobulin A: 317 mg/dL — ABNORMAL HIGH (ref 47–310)

## 2022-08-01 ENCOUNTER — Ambulatory Visit (INDEPENDENT_AMBULATORY_CARE_PROVIDER_SITE_OTHER): Payer: BC Managed Care – PPO | Admitting: Nurse Practitioner

## 2022-08-01 VITALS — BP 124/76 | HR 81 | Temp 97.6°F | Ht <= 58 in | Wt 161.0 lb

## 2022-08-01 DIAGNOSIS — Z0001 Encounter for general adult medical examination with abnormal findings: Secondary | ICD-10-CM

## 2022-08-01 DIAGNOSIS — R6881 Early satiety: Secondary | ICD-10-CM

## 2022-08-01 NOTE — Assessment & Plan Note (Signed)
Patient encouraged to follow-up with GI as scheduled and to undergo further testing as recommended by them.  Patient reports understanding.

## 2022-08-01 NOTE — Assessment & Plan Note (Signed)
Patient encouraged to follow healthy diet aimed at eating mainly lean proteins and vegetables for calories.  Also encouraged to participate in 150 minutes of exercise per week as tolerated.

## 2022-08-01 NOTE — Progress Notes (Signed)
Established Patient Office Visit  Subjective   Patient ID: Faith Evans, female    DOB: Jul 29, 1981  Age: 41 y.o. MRN: [id number]  Chief Complaint  Patient presents with   Annual Exam   Health Maintenance: Up-to-date with Pap smear, screening blood work, and mammogram.  Based on last labs from previous provider patient's ASCVD risk was approximate 1.2%.  Patient is due for tetanus shot, hep C screening, flu vaccine, and HIV screening.  Patient would like to defer this at this time.  Abdominal pain/Early satiety: Being evaluated by gastroenterology.    Review of Systems  Constitutional:  Positive for weight loss. Negative for fever and malaise/fatigue.  Respiratory:  Negative for cough, shortness of breath and wheezing.   Cardiovascular:  Negative for chest pain.  Gastrointestinal:  Positive for abdominal pain and heartburn. Negative for blood in stool.  Neurological:  Negative for seizures, loss of consciousness and headaches.  Psychiatric/Behavioral:  Negative for depression and suicidal ideas.       Objective:     BP 124/76 (BP Location: Left Arm, Patient Position: Sitting, Cuff Size: Large)   Pulse 81   Temp 97.6 F (36.4 C) (Temporal)   Ht '4\' 10"'$  (1.473 m)   Wt 161 lb (73 kg)   SpO2 97%   BMI 33.65 kg/m  BP Readings from Last 3 Encounters:  08/01/22 124/76  07/26/22 (!) 142/86  05/30/22 134/80   Wt Readings from Last 3 Encounters:  08/01/22 161 lb (73 kg)  07/26/22 162 lb 4 oz (73.6 kg)  05/30/22 165 lb (74.8 kg)        08/01/2022    8:13 AM 05/30/2022    4:33 PM 04/26/2022    3:51 PM  PHQ9 SCORE ONLY  PHQ-9 Total Score 2 0 0   ASCVD RISK SCORE: 1.2%  Physical Exam Vitals reviewed.  Constitutional:      Appearance: Normal appearance.  HENT:     Head: Normocephalic and atraumatic.     Right Ear: Tympanic membrane, ear canal and external ear normal.     Left Ear: Tympanic membrane, ear canal and external ear normal.  Eyes:     General:         Right eye: No discharge.        Left eye: No discharge.     Extraocular Movements: Extraocular movements intact.     Conjunctiva/sclera: Conjunctivae normal.     Pupils: Pupils are equal, round, and reactive to light.  Neck:     Vascular: No carotid bruit.  Cardiovascular:     Rate and Rhythm: Normal rate and regular rhythm.     Pulses: Normal pulses.     Heart sounds: Normal heart sounds. No murmur heard. Pulmonary:     Effort: Pulmonary effort is normal.     Breath sounds: Normal breath sounds.  Chest:     Comments: Deferred per patient preference Abdominal:     General: Abdomen is flat. Bowel sounds are normal. There is no distension.     Palpations: Abdomen is soft. There is no mass.     Tenderness: There is no abdominal tenderness.  Musculoskeletal:        General: No tenderness.     Cervical back: Neck supple. No muscular tenderness.     Right lower leg: No edema.     Left lower leg: No edema.  Lymphadenopathy:     Cervical: No cervical adenopathy.     Upper Body:     Right upper  body: No supraclavicular adenopathy.     Left upper body: No supraclavicular adenopathy.  Skin:    General: Skin is warm and dry.  Neurological:     General: No focal deficit present.     Mental Status: She is alert and oriented to person, place, and time.     Motor: No weakness.     Gait: Gait normal.  Psychiatric:        Mood and Affect: Mood normal.        Behavior: Behavior normal.        Judgment: Judgment normal.      No results found for any visits on 08/01/22.    The ASCVD Risk score (Arnett DK, et al., 2019) failed to calculate for the following reasons:   Cannot find a previous HDL lab   Cannot find a previous total cholesterol lab    Assessment & Plan:   Problem List Items Addressed This Visit       Other   Early satiety    Patient encouraged to follow-up with GI as scheduled and to undergo further testing as recommended by them.  Patient reports understanding.         Encounter for general adult medical examination with abnormal findings - Primary    Patient encouraged to follow healthy diet aimed at eating mainly lean proteins and vegetables for calories.  Also encouraged to participate in 150 minutes of exercise per week as tolerated.       Return in about 6 months (around 01/31/2023) for F/U with Siara Gorder.    Ailene Ards, NP

## 2022-08-22 NOTE — Progress Notes (Signed)
Reviewed and agree with management plans. ? ?Kayston Jodoin L. Harmoney Sienkiewicz, MD, MPH  ?

## 2022-08-26 ENCOUNTER — Other Ambulatory Visit: Payer: Self-pay | Admitting: Nurse Practitioner

## 2022-08-26 NOTE — Telephone Encounter (Signed)
Please advise 

## 2022-08-30 ENCOUNTER — Other Ambulatory Visit: Payer: Self-pay

## 2022-08-30 ENCOUNTER — Encounter: Payer: Self-pay | Admitting: Gastroenterology

## 2022-09-06 ENCOUNTER — Encounter: Payer: Self-pay | Admitting: Gastroenterology

## 2022-09-06 ENCOUNTER — Ambulatory Visit (AMBULATORY_SURGERY_CENTER): Payer: BC Managed Care – PPO | Admitting: Gastroenterology

## 2022-09-06 ENCOUNTER — Telehealth: Payer: Self-pay

## 2022-09-06 VITALS — BP 127/94 | HR 64 | Temp 98.9°F | Resp 14 | Ht <= 58 in | Wt 162.0 lb

## 2022-09-06 DIAGNOSIS — K2970 Gastritis, unspecified, without bleeding: Secondary | ICD-10-CM

## 2022-09-06 DIAGNOSIS — Z1211 Encounter for screening for malignant neoplasm of colon: Secondary | ICD-10-CM | POA: Diagnosis not present

## 2022-09-06 DIAGNOSIS — K219 Gastro-esophageal reflux disease without esophagitis: Secondary | ICD-10-CM

## 2022-09-06 DIAGNOSIS — D123 Benign neoplasm of transverse colon: Secondary | ICD-10-CM

## 2022-09-06 DIAGNOSIS — K5730 Diverticulosis of large intestine without perforation or abscess without bleeding: Secondary | ICD-10-CM | POA: Diagnosis not present

## 2022-09-06 DIAGNOSIS — K2950 Unspecified chronic gastritis without bleeding: Secondary | ICD-10-CM | POA: Diagnosis not present

## 2022-09-06 DIAGNOSIS — K3189 Other diseases of stomach and duodenum: Secondary | ICD-10-CM | POA: Diagnosis not present

## 2022-09-06 DIAGNOSIS — Z800 Family history of malignant neoplasm of digestive organs: Secondary | ICD-10-CM

## 2022-09-06 MED ORDER — SODIUM CHLORIDE 0.9 % IV SOLN: 500.0000 mL | Freq: Once | INTRAVENOUS | Status: DC

## 2022-09-06 MED ORDER — PANTOPRAZOLE SODIUM 40 MG PO TBEC: 40.0000 mg | DELAYED_RELEASE_TABLET | Freq: Two times a day (BID) | ORAL | 3 refills | Status: DC

## 2022-09-06 NOTE — Progress Notes (Signed)
To pacu, VSS. Report to [email or web address] 

## 2022-09-06 NOTE — Op Note (Signed)
[facility] Patient Name: Varie Machamer Procedure Date: 09/06/2022 2:20 PM MRN: [id number] Endoscopist: Thornton Park MD, MD, [id number] Age: 42 Referring MD:  Date of Birth: June 04, 1981 Gender: Female Account #: [id number] Procedure:                Upper GI endoscopy Indications:              Heartburn, Early satiety, Weight loss Medicines:                Monitored Anesthesia Care Procedure:                Pre-Anesthesia Assessment:                           - Prior to the procedure, a History and Physical                            was performed, and patient medications and                            allergies were reviewed. The patient's tolerance of                            previous anesthesia was also reviewed. The risks                            and benefits of the procedure and the sedation                            options and risks were discussed with the patient.                            All questions were answered, and informed consent                            was obtained. Prior Anticoagulants: The patient has                            taken no anticoagulant or antiplatelet agents. ASA                            Grade Assessment: II - A patient with mild systemic                            disease. After reviewing the risks and benefits,                            the patient was deemed in satisfactory condition to                            undergo the procedure.                           After obtaining informed consent, the endoscope was  passed under direct vision. Throughout the                            procedure, the patient's blood pressure, pulse, and                            oxygen saturations were monitored continuously. The                            GIF [id number] #[id number] was introduced through the                            mouth, and advanced to the third part of duodenum.                            The upper GI  endoscopy was accomplished without                            difficulty. The patient tolerated the procedure                            well. Scope In: Scope Out: Findings:                 The examined esophagus was normal. The z-line is                            located 36 cm from the incisors. Biopsies were                            taken with a cold forceps for histology. Estimated                            blood loss was minimal.                           Diffuse mildly erythematous mucosa without bleeding                            was found in the gastric body. Biopsies were taken                            from the antrum, body, and fundus with a cold                            forceps for histology. Estimated blood loss was                            minimal.                           The examined duodenum was normal. Biopsies were                            taken with a cold forceps  for histology. Estimated                            blood loss was minimal.                           The cardia and gastric fundus were normal on                            retroflexion.                           The exam was otherwise without abnormality. Complications:            No immediate complications. Estimated Blood Loss:     Estimated blood loss was minimal. Impression:               - Normal esophagus. Biopsied.                           - Erythematous mucosa in the gastric body. Biopsied.                           - Normal examined duodenum. Biopsied.                           - The examination was otherwise normal. Recommendation:           - Patient has a contact number available for                            emergencies. The signs and symptoms of potential                            delayed complications were discussed with the                            patient. Return to normal activities tomorrow.                            Written discharge instructions were provided to the                             patient.                           - Resume previous diet.                           - Continue present medications.                           - Increase pantoprazole to 40 mg BID.                           - Await pathology results.                           -  No aspirin, ibuprofen, naproxen, or other                            non-steroidal anti-inflammatory drugs. Thornton Park MD, MD 09/06/2022 3:03:30 PM This report has been signed electronically.

## 2022-09-06 NOTE — Progress Notes (Signed)
Pt's states no medical or surgical changes since previsit or office visit. VS assessed by D.T

## 2022-09-06 NOTE — Progress Notes (Signed)
Called to room to assist during endoscopic procedure.  Patient ID and intended procedure confirmed with present staff. Received instructions for my participation in the procedure from the performing physician.  

## 2022-09-06 NOTE — Telephone Encounter (Signed)
-----  Message from Thornton Park, MD sent at 09/06/2022  3:00 PM EST ----- Please schedule follow-up office visit with Faith Evans - next available.  Thank you.  KLB

## 2022-09-06 NOTE — Patient Instructions (Signed)
Please read handouts provided. Continue present medications. Await pathology results. Follow a high fiber diet. Repeat colonoscopy in 5 years for screening. Increase pantoprazole to 40 mg twice daily. No aspirin, naproxen, or other non-steriodal anti-inflammatory drugs.  YOU HAD AN ENDOSCOPIC PROCEDURE TODAY AT [facility]:   Refer to the procedure report that was given to you for any specific questions about what was found during the examination.  If the procedure report does not answer your questions, please call your gastroenterologist to clarify.  If you requested that your care partner not be given the details of your procedure findings, then the procedure report has been included in a sealed envelope for you to review at your convenience later.  YOU SHOULD EXPECT: Some feelings of bloating in the abdomen. Passage of more gas than usual.  Walking can help get rid of the air that was put into your GI tract during the procedure and reduce the bloating. If you had a lower endoscopy (such as a colonoscopy or flexible sigmoidoscopy) you may notice spotting of blood in your stool or on the toilet paper. If you underwent a bowel prep for your procedure, you may not have a normal bowel movement for a few days.  Please Note:  You might notice some irritation and congestion in your nose or some drainage.  This is from the oxygen used during your procedure.  There is no need for concern and it should clear up in a day or so.  SYMPTOMS TO REPORT IMMEDIATELY:  Following lower endoscopy (colonoscopy or flexible sigmoidoscopy):  Excessive amounts of blood in the stool  Significant tenderness or worsening of abdominal pains  Swelling of the abdomen that is new, acute  Fever of 100F or higher  Following upper endoscopy (EGD)  Vomiting of blood or coffee ground material  New chest pain or pain under the shoulder blades  Painful or persistently difficult swallowing  New shortness of  breath  Fever of 100F or higher  Black, tarry-looking stools  For urgent or emergent issues, a gastroenterologist can be reached at any hour by calling [phone]. Do not use MyChart messaging for urgent concerns.    DIET:  We do recommend a small meal at first, but then you may proceed to your regular diet.  Drink plenty of fluids but you should avoid alcoholic beverages for 24 hours.  ACTIVITY:  You should plan to take it easy for the rest of today and you should NOT DRIVE or use heavy machinery until tomorrow (because of the sedation medicines used during the test).    FOLLOW UP: Our staff will call the number listed on your records the next business day following your procedure.  We will call around 7:15- 8:00 am to check on you and address any questions or concerns that you may have regarding the information given to you following your procedure. If we do not reach you, we will leave a message.     If any biopsies were taken you will be contacted by phone or by letter within the next 1-3 weeks.  Please call us at [phone] if you have not heard about the biopsies in 3 weeks.    SIGNATURES/CONFIDENTIALITY: You and/or your care partner have signed paperwork which will be entered into your electronic medical record.  These signatures attest to the fact that that the information above on your After Visit Summary has been reviewed and is understood.  Full responsibility of the confidentiality of this discharge  information lies with you and/or your care-partner.

## 2022-09-06 NOTE — Telephone Encounter (Signed)
OV scheduled for 09/25/22 at 10:00 am with Jaclyn Shaggy, NP. Pt notified via mychart.

## 2022-09-06 NOTE — Op Note (Addendum)
[facility] Patient Name: [place] Procedure Date: 09/06/2022 2:10 PM MRN: [id number] Endoscopist: Thornton Park MD, MD, [id number] Age: 42 Referring MD:  Date of Birth: Feb 14, 1981 Gender: Female Account #: [id number] Procedure:                Colonoscopy Indications:              Screening in patient at increased risk: Family                            history of 1st-degree relative with colorectal                            cancer before age 52 years (Mother in her late 33s                            or early 63s) Medicines:                Monitored Anesthesia Care Procedure:                Pre-Anesthesia Assessment:                           - Prior to the procedure, a History and Physical                            was performed, and patient medications and                            allergies were reviewed. The patient's tolerance of                            previous anesthesia was also reviewed. The risks                            and benefits of the procedure and the sedation                            options and risks were discussed with the patient.                            All questions were answered, and informed consent                            was obtained. Prior Anticoagulants: The patient has                            taken no anticoagulant or antiplatelet agents. ASA                            Grade Assessment: II - A patient with mild systemic                            disease. After reviewing the risks and benefits,  the patient was deemed in satisfactory condition to                            undergo the procedure.                           After obtaining informed consent, the colonoscope                            was passed under direct vision. Throughout the                            procedure, the patient's blood pressure, pulse, and                            oxygen saturations were monitored continuously.  The                            Colonoscope was introduced through the anus and                            advanced to the 3 cm into the ileum. A second                            forward view of the right colon was performed. The                            colonoscopy was performed without difficulty. The                            patient tolerated the procedure well. The quality                            of the bowel preparation was good. The terminal                            ileum, ileocecal valve, appendiceal orifice, and                            rectum were photographed. Scope In: 2:40:12 PM Scope Out: 2:59:22 PM Scope Withdrawal Time: 0 hours 15 minutes 16 seconds  Total Procedure Duration: 0 hours 19 minutes 10 seconds  Findings:                 The perianal and digital rectal examinations were                            normal.                           Two sessile polyps were found in the transverse                            colon. The polyps were 3 to 4 mm in size. These  polyps were removed with a cold snare. Resection                            and retrieval were complete. Estimated blood loss                            was minimal.                           Multiple medium-mouthed and small-mouthed                            diverticula were found in the sigmoid colon and                            descending colon.                           A segmental area of mildly congested and                            erythematous mucosa was found in the sigmoid colon                            in the area of the most dense diverticulosis from                            26-32 cm. Biopsies were taken with a cold forceps                            for histology. Estimated blood loss was minimal.                           The exam was otherwise without abnormality on                            direct and retroflexion views. Complications:            No  immediate complications. Estimated Blood Loss:     Estimated blood loss was minimal. Impression:               - Two 3 to 4 mm polyps in the transverse colon,                            removed with a cold snare. Resected and retrieved.                           - Diverticulosis in the sigmoid colon and in the                            descending colon.                           - Congested and erythematous mucosa in the sigmoid  colon. Biopsied.                           - The examination was otherwise normal on direct                            and retroflexion views. Recommendation:           - Patient has a contact number available for                            emergencies. The signs and symptoms of potential                            delayed complications were discussed with the                            patient. Return to normal activities tomorrow.                            Written discharge instructions were provided to the                            patient.                           - Resume previous diet.                           - Continue present medications.                           - Await pathology results.                           - Repeat colonoscopy in 5 years for surveillance                            regardless of pathology results given the family                            history.                           - Follow a high fiber diet. Drink at least 64                            ounces of water daily. Add a daily stool bulking                            agent such as psyllium (an exampled would be                            Metamucil).                           - Emerging evidence supports eating a  diet of                            fruits, vegetables, grains, calcium, and yogurt                            while reducing red meat and alcohol may reduce the                            risk of colon cancer.                            - Thank you for allowing me to be involved in your                            colon cancer prevention. Thornton Park MD, MD 09/06/2022 3:08:08 PM This report has been signed electronically.

## 2022-09-06 NOTE — Progress Notes (Signed)
Referring Provider: Ailene Ards, NP Primary Care Physician:  Ailene Ards, NP   Indication for EGD:  GERD, early satiety, weight loss Indication for Colonoscopy:  Colon cancer screening   IMPRESSION:  GERD, early satiety, weight loss Need for colon cancer screening Appropriate candidate for monitored anesthesia care  PLAN: EGD and Colonoscopy in the [facility] today   HPI: Faith Evans is a 42 y.o. female presents for diagnostic endoscopy and screening colonoscopy.  She describes having heartburn for a long time, at least since 2017. She takes Pantoprazole '40mg'$  QD at bed time since 2017.  She occasionally takes a second Pantoprazole as needed.  She has increased burping and pain in her throat at times.  She has intermittent mild pill dysphagia.  No difficulty swallowing liquids or solids.  She complains of having nausea, feels like she might throw up but does not.  She has early satiety, she feels full after a few bites.  She has lost 10 pounds over the past few months.  He was seen by her primary care provider Faith Ruths, NP ordered an abdominal sonogram which was completed 06/07/2022 which identified cholelithiasis without evidence of acute cystitis and hepatic steatosis was noted.  Her bowel pattern varies, she can pass a soft and loose stool on the same day.  No black stools or rectal bleeding.  No recent labs found in epic or Care Everywhere.   Mother was diagnosed with colon cancer in her late 51's or age 66.   Past Medical History:  Diagnosis Date   GERD (gastroesophageal reflux disease)     Past Surgical History:  Procedure Laterality Date   CESAREAN SECTION      Current Outpatient Medications  Medication Sig Dispense Refill   albuterol (VENTOLIN HFA) 108 (90 Base) MCG/ACT inhaler      Cholecalciferol 125 MCG (5000 UT) capsule Take 5,000 Units by mouth daily.     famotidine (PEPCID) 20 MG tablet TAKE 1 TABLET BY MOUTH EVERYDAY AT BEDTIME 90 tablet 1   levocetirizine  (XYZAL) 5 MG tablet Take 5 mg by mouth daily.     levonorgestrel (KYLEENA) 19.5 MG IUD Take by intrauterine route.     pantoprazole (PROTONIX) 40 MG tablet TAKE 1 TABLET BY MOUTH EVERY DAY 90 tablet 1   triamcinolone cream (KENALOG) 0.1 % Apply 1 application. topically 2 (two) times daily. To affected area till better 80 g 0   No current facility-administered medications for this visit.    Allergies as of 09/06/2022 - Review Complete 08/01/2022  Allergen Reaction Noted   Amoxicillin Rash 01/19/2017   Cinnamon Rash 05/30/2022    Family History  Problem Relation Age of Onset   Cancer Mother    Seizures Father    Cancer Maternal Grandfather      Physical Exam: General:   Alert,  well-nourished, pleasant and cooperative in NAD Head:  Normocephalic and atraumatic. Eyes:  Sclera clear, no icterus.   Conjunctiva pink. Mouth:  No deformity or lesions.   Neck:  Supple; no masses or thyromegaly. Lungs:  Clear throughout to auscultation.   No wheezes. Heart:  Regular rate and rhythm; no murmurs. Abdomen:  Soft, non-tender, nondistended, normal bowel sounds, no rebound or guarding.  Msk:  Symmetrical. No boney deformities LAD: No inguinal or umbilical LAD Extremities:  No clubbing or edema. Neurologic:  Alert and  oriented x4;  grossly nonfocal Skin:  No obvious rash or bruise. Psych:  Alert and cooperative. Normal mood and affect.  Studies/Results: No results found.    [place] Ramone L. Tarri Glenn, MD, MPH 09/06/2022, 1:21 PM

## 2022-09-09 ENCOUNTER — Telehealth: Payer: Self-pay

## 2022-09-09 NOTE — Telephone Encounter (Signed)
  Follow up Call-     09/06/2022    1:32 PM  Call back number  Post procedure Call Back phone  # [phone]  Permission to leave phone message Yes     Patient questions:  Do you have a fever, pain , or abdominal swelling? No. Pain Score  0 *  Have you tolerated food without any problems? Yes.    Have you been able to return to your normal activities? Yes.    Do you have any questions about your discharge instructions: Diet   No. Medications  No. Follow up visit  No.  Do you have questions or concerns about your Care? No.  Actions: * If pain score is 4 or above: No action needed, pain <4.

## 2022-09-17 ENCOUNTER — Encounter: Payer: Self-pay | Admitting: Gastroenterology

## 2022-09-17 NOTE — Progress Notes (Signed)
Opened in error

## 2022-09-25 ENCOUNTER — Ambulatory Visit: Payer: BC Managed Care – PPO | Admitting: Nurse Practitioner

## 2022-09-30 NOTE — Progress Notes (Unsigned)
09/30/2022 Faith Evans [id number] 04-Jan-1981   Chief Complaint:  History of Present Illness: Faith Evans is a 42 year old female with a past medical history of GERD, kidney stones, preeclampsia, HELLP syndrome, hepatic steatosis and GERD. S/P C-section 07/2006.    Mother was diagnosed with colon cancer in her late 57's or age 25.   She has occasional nausea several days weekly. No vomiting. Nausea after she eats lunch or dinner.   Once every 3 weeks, thought MI ER, indigestion.  Mid chest to  upper back impailed burping, reflux  Tears in eyes.    Middle night worse aftes she lays down, sits up in a recliner.       Latest Ref Rng & Units 07/26/2022    9:39 AM 01/19/2017    3:07 AM  CBC  WBC 4.0 - 10.5 K/uL 9.8  12.2   Hemoglobin 12.0 - 15.0 g/dL 15.1  14.2   Hematocrit 36.0 - 46.0 % 45.4  42.3   Platelets 150.0 - 400.0 K/uL 268.0  250        Latest Ref Rng & Units 07/26/2022    9:39 AM 01/19/2017    3:07 AM  CMP  Glucose 70 - 99 mg/dL 97  122   BUN 6 - 23 mg/dL 10  11   Creatinine 0.40 - 1.20 mg/dL 0.73  0.66   Sodium 135 - 145 mEq/L 140  137   Potassium 3.5 - 5.1 mEq/L 3.7  3.1   Chloride 96 - 112 mEq/L 101  100   CO2 19 - 32 mEq/L 31  28   Calcium 8.4 - 10.5 mg/dL 9.6  9.3   Total Protein 6.0 - 8.3 g/dL 7.8  7.9   Total Bilirubin 0.2 - 1.2 mg/dL 0.5  0.5   Alkaline Phos 39 - 117 U/L 72  63   AST 0 - 37 U/L 17  24   ALT 0 - 35 U/L 27  28      Complete abdominal sono 06/07/2022: FINDINGS: Gallbladder: Gallstones are noted with the largest measuring 1.6 cm in diameter. No gallbladder wall thickening visualized. No sonographic Murphy sign noted by sonographer.   Common bile duct: Diameter: 3 mm   Liver: No focal lesion identified. Increased parenchymal echogenicity. Portal vein is patent on color Doppler imaging with normal direction of blood flow towards the liver.   IVC: No abnormality visualized.   Pancreas: Visualized portion unremarkable.    Spleen: Size and appearance within normal limits.   Right Kidney: Length: 11.3 cm. Echogenicity within normal limits. No mass or hydronephrosis visualized.   Left Kidney: Length: 11.1 cm. Echogenicity within normal limits. No mass or hydronephrosis visualized.   Abdominal aorta: No aneurysm visualized.   Other findings: None.   IMPRESSION: 1. Cholelithiasis without evidence of acute cholecystitis. 2. Increased liver echogenicity is consistent with hepatic steatosis.  EGD 09/06/2022 by Dr. Tarri Glenn:  Colonoscopy 09/06/2022:  - 5 Year colonoscopy recall  1. Surgical [P], duodenal bx REACTIVE DUODENAL MUCOSA WITH FOCAL GASTRIC METAPLASIA COMPATIBLE WITH PEPTIC DUODENITIS 2. Surgical [P], fundus, gastric antrum, and gastric body REACTIVE GASTROPATHY WITH MILD CHRONIC GASTRITIS NEGATIVE FOR H. PYLORI, INTESTINAL METAPLASIA, DYSPLASIA AND CARCINOMA 3. Surgical [P], distal esophagus REACTIVE SQUAMOUS MUCOSA NEGATIVE FOR GLANDULAR EPITHELIUM, EOSINOPHILS, DYSPLASIA AND CARCINOMA 4. Surgical [P], colon, transverse, polyp (2) TUBULAR ADENOMA LYMPHOID AGGREGATE NEGATIVE FOR HIGH-GRADE DYSPLASIA AND CARCINOMA 5. Surgical [P], colon, sigmoid BENIGN COLONIC MUCOSA WITH NO DIAGNOSTIC ABNORMALITY  The biopsies of  your esophagus were normal. The biopsies that were taken of your stomach showed mild gastritis, an inflammation of the stomach lining. The biopsies from your small intestines, the duodenum show mild inflammation, as well. The inflammation is best treated by avoiding nonsteroidal anti-inflammatory medications (NSAIDs) such as ibuprofen, Aleve, Advil, and Naprosyn and using the medications pantoprazole and famotidine.  A polyp was removed during your colonoscopy. It was a benign, but precancerous polyp known as a tubular adenoma. There was no cancer. Given these results and your family history you should have another colonoscopy in 5 years to make sure that you do not develop additional  polyps.   Past Medical History:  Diagnosis Date   Allergy    Blood transfusion without reported diagnosis    GERD (gastroesophageal reflux disease)    Past Surgical History:  Procedure Laterality Date   CESAREAN SECTION         Current Medications, Allergies, Past Medical History, Past Surgical History, Family History and Social History were reviewed in [employer] record.   Review of Systems:   Constitutional: Negative for fever, sweats, chills or weight loss.  Respiratory: Negative for shortness of breath.   Cardiovascular: Negative for chest pain, palpitations and leg swelling.  Gastrointestinal: See HPI.  Musculoskeletal: Negative for back pain or muscle aches.  Neurological: Negative for dizziness, headaches or paresthesias.    Physical Exam: There were no vitals taken for this visit. General: in no acute distress. Head: Normocephalic and atraumatic. Eyes: No scleral icterus. Conjunctiva pink . Ears: Normal auditory acuity. Mouth: Dentition intact. No ulcers or lesions.  Lungs: Clear throughout to auscultation. Heart: Regular rate and rhythm, no murmur. Abdomen: Soft, nontender and nondistended. No masses or hepatomegaly. Normal bowel sounds x 4 quadrants.  Rectal: *** Musculoskeletal: Symmetrical with no gross deformities. Extremities: No edema. Neurological: Alert oriented x 4. No focal deficits.  Psychological: Alert and cooperative. Normal mood and affect  Assessment and Recommendations:  1) GERD  Gallstones   Hepatic steatosis

## 2022-10-01 ENCOUNTER — Ambulatory Visit: Payer: BC Managed Care – PPO | Admitting: Nurse Practitioner

## 2022-10-01 ENCOUNTER — Encounter: Payer: Self-pay | Admitting: Nurse Practitioner

## 2022-10-01 VITALS — BP 122/80 | HR 84 | Ht <= 58 in | Wt 161.2 lb

## 2022-10-01 DIAGNOSIS — R110 Nausea: Secondary | ICD-10-CM | POA: Diagnosis not present

## 2022-10-01 DIAGNOSIS — K219 Gastro-esophageal reflux disease without esophagitis: Secondary | ICD-10-CM

## 2022-10-01 DIAGNOSIS — K8020 Calculus of gallbladder without cholecystitis without obstruction: Secondary | ICD-10-CM | POA: Diagnosis not present

## 2022-10-01 MED ORDER — HYOSCYAMINE SULFATE 0.125 MG SL SUBL: 0.1250 mg | SUBLINGUAL_TABLET | Freq: Four times a day (QID) | SUBLINGUAL | 0 refills | Status: DC | PRN

## 2022-10-01 MED ORDER — FAMOTIDINE 40 MG PO TABS: 40.0000 mg | ORAL_TABLET | Freq: Every day | ORAL | 1 refills | Status: DC

## 2022-10-01 NOTE — Patient Instructions (Addendum)
Continue Pantoprazole 1 by mouth daily.  We have sent the following medications to your pharmacy for you to pick up at your convenience: Hyoscyamine & Famotidine  We have sent over a referral to General Surgery.  Thank you for trusting me with your gastrointestinal care!   Carl Best, CRNP

## 2022-10-02 NOTE — Progress Notes (Signed)
Reviewed and agree with management plans. ? ?Cristine Daw L. Jacobe Study, MD, MPH  ?

## 2022-10-30 ENCOUNTER — Ambulatory Visit: Payer: Self-pay | Admitting: Surgery

## 2022-10-30 NOTE — H&P (Signed)
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 Tarri Glenn and Carl Best, NP at [facility] for recent workup of abdominal pain and nausea.  She states that her symptoms began in approximately 2018.  She has intermittent episodes, several times per year, Of severe subxiphoid and lower chest pain, radiating to her back associated with nausea.  These last anywhere from 30 minutes to hours.  She has nausea most days, most noticeable after eating lunch or dinner.  No emesis.  No known fevers or jaundice.  She does note that the symptoms are exacerbated by high-fat meals such as [employer].  She also has diarrhea with fatty foods.  She is on PPI and Pepcid.  She works at a bank.  Right upper quadrant ultrasound 06/06/2022: Multiple gallstones up to 1.6 cm, no sonographic evidence of cholecystitis, common bile duct 3 mm, hepatic steatosis.  Labs 07/26/2022 including CMP, CBC unremarkable.  IgA borderline elevated at 317.  Screening colonoscopy by Dr. Tarri Glenn 09/06/2022: 2 sessile transverse colon polyps, sigmoid descending diverticulosis with an area of congested and erythematous mucosa in the sigmoid.  Path tubular adenomas without dysplasia or carcinoma, benign colonic mucosa from the erythematous region.   EGD by Dr. Tarri Glenn 09/06/2022: Esophagus normal, diffuse mildly erythematous mucosa in the gastric body, duodenum normal, cardia and fundus normal on retroflexion.  Path with peptic duodenitis, reactive gastropathy negative  for H. pylori or intestinal metaplasia/dysplasia, reactive squamous mucosa in the distal esophagus  Review of Systems: A complete review of systems was obtained from the patient.  I have reviewed this information and discussed as appropriate with the patient.  See HPI as well for other ROS.   Medical History: Past Medical History:  Diagnosis Date   GERD (gastroesophageal reflux disease)    Hyperlipidemia     There is no problem list on file for this patient.   Past Surgical History:  Procedure Laterality Date   CESAREAN SECTION       Allergies  Allergen Reactions   Amoxicillin Rash   Cinnamon Rash    Current Outpatient Medications on File Prior to Visit  Medication Sig Dispense Refill   famotidine (PEPCID) 40 MG tablet Take 40 mg by mouth at bedtime     hyoscyamine (LEVSIN/SL) 0.125 mg SL tablet Place under the tongue     pantoprazole (PROTONIX) 40 MG DR tablet Take 1 tablet by mouth 2 (two) times daily     No current facility-administered medications on file prior to visit.    Family History  Problem Relation Age of Onset   Cancer Mother    Seizures Father      Social History   Tobacco Use  Smoking Status Never  Smokeless Tobacco Never     Social History   Socioeconomic History   Marital status: Married  Tobacco Use   Smoking status: Never   Smokeless tobacco: Never  Vaping Use   Vaping Use: Never used  Substance and Sexual Activity   Alcohol use: Yes   Drug use: Never    Objective:  Vitals:   10/30/22 1324  BP: (!) 164/98  Pulse: 76  Temp: 36.7 C (98 F)  SpO2: 98%  Weight: 72.1 kg (159 lb)  Height: 147.3 cm ('4\' 10"'$ )    Body mass index is 33.23 kg/m.  Gen: A&Ox3, no distress  Chest: respiratory effort is normal.   Cardiovascular: RRR  Gastrointestinal: soft, nondistended, mildly tender along the right hemiabdomen. No mass, hepatomegaly or splenomegaly. No hernia. Psych: appropriate mood and affect, normal insight/judgment intact   Skin: warm and dry    Assessment and Plan:  Diagnoses and all orders for this visit:  Biliary colic  Chronic cholecystitis     I recommend proceeding with laparoscopic or robotic cholecystectomy with possible cholangiogram.  Discussed the relevant anatomy using a diagram to demonstrate, and went over surgical technique.  Discussed risks of surgery including bleeding, pain, scarring, intraabdominal injury specifically to the common bile duct and sequelae, subtotal cholecystectomy, bile leak, conversion to open surgery, failure to resolve symptoms, general cardiovascular/thromboembolic/ pulmonary complications. Questions welcomed and answered to patient's satisfaction.  Patient wishes to proceed with surgery.   Genice Kimberlin Raquel James, MD

## 2022-12-12 ENCOUNTER — Telehealth: Payer: BC Managed Care – PPO | Admitting: Physician Assistant

## 2022-12-12 DIAGNOSIS — L255 Unspecified contact dermatitis due to plants, except food: Secondary | ICD-10-CM | POA: Diagnosis not present

## 2022-12-12 MED ORDER — PREDNISONE 10 MG PO TABS: ORAL_TABLET | ORAL | 0 refills | Status: AC

## 2022-12-12 NOTE — Progress Notes (Signed)
I have spent 5 minutes in review of e-visit questionnaire, review and updating patient chart, medical decision making and response to patient.   Shamar Kracke Cody Ford Peddie, PA-C    

## 2022-12-12 NOTE — Progress Notes (Signed)

## 2023-01-13 NOTE — Progress Notes (Signed)
COVID Vaccine received:  [x]  No []  Yes Date of any COVID positive Test in last 90 days:  none  PCP -Jiles Prows, NP  Cardiologist - none  Chest x-ray - 2018    2v  Epic EKG - 01-20-2017 Epic    Will repeat at PST  Stress Test -  ECHO -  Cardiac Cath -   PCR screen: []  Ordered & Completed           []   No Order but Needs PROFEND           [x]   N/A for this surgery  Surgery Plan:  [x]  Ambulatory                            []  Outpatient in bed                            []  Admit  Anesthesia:    [x]  General  []  Spinal                           []   Choice []   MAC  Bowel Prep - [x]  No  []   Yes ______  Pacemaker / ICD device [x]  No []  Yes   Spinal Cord Stimulator:[x]  No []  Yes       History of Sleep Apnea? [x]  No []  Yes   CPAP used?- [x]  No []  Yes    Does the patient monitor blood sugar?          []  No []  Yes  [x]  N/A  Patient has: [x]  NO Hx DM   []  Pre-DM                 []  DM1  []   DM2  Blood Thinner / Instructions: none Aspirin Instructions: none  ERAS Protocol Ordered: [x]  No  []  Yes Patient is to be NPO after: Midnight prior   Activity level: Patient is able climb a flight of stairs without difficulty; [x]  No CP   but would have SOB d/t congenital right lung anomaly.  Patient can  perform ADLs without assistance.   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 GERD, HTN, Fatty Liver, Has Kyleena IUD  Patient denies shortness of breath, fever, cough and chest pain at PAT appointment.  Patient verbalized understanding and agreement to the Pre-Surgical Instructions that were given to them at this PAT appointment. Patient was also educated of the need to review these PAT instructions again prior to her surgery.I reviewed the appropriate phone numbers to call if they have any and questions or concerns.

## 2023-01-13 NOTE — Patient Instructions (Signed)
SURGICAL WAITING ROOM VISITATION Patients having surgery or a procedure may have no more than 2 support people in the waiting area - these visitors may rotate in the visitor waiting room.   Due to an increase in RSV and influenza rates and associated hospitalizations, children ages 64 and under may not visit patients in [facility] hospitals. If the patient needs to stay at the hospital during part of their recovery, the visitor guidelines for inpatient rooms apply.  PRE-OP VISITATION  Pre-op nurse will coordinate an appropriate time for 1 support person to accompany the patient in pre-op.  This support person may not rotate.  This visitor will be contacted when the time is appropriate for the visitor to come back in the pre-op area.  Please refer to the [facility] website for the visitor guidelines for Inpatients (after your surgery is over and you are in a regular room).  You are not required to quarantine at this time prior to your surgery. However, you must do this: Hand Hygiene often Do NOT share personal items Notify your provider if you are in close contact with someone who has COVID or you develop fever 100.4 or greater, new onset of sneezing, cough, sore throat, shortness of breath or body aches.  If you test positive for Covid or have been in contact with anyone that has tested positive in the last 10 days please notify you surgeon.    Your procedure is scheduled on:  Wednesday  January 22, 2023  Report to [facility] Main Entrance: [address] [facility] entrance where the [employer] is available.   Report to admitting at: 06:15  AM  +++++Call this number if you have any questions or problems the morning of surgery [phone]  DO NOT EAT OR DRINK ANYTHING AFTER MIDNIGHT THE NIGHT PRIOR TO YOUR SURGERY / PROCEDURE.   FOLLOW BOWEL PREP AND ANY ADDITIONAL PRE OP INSTRUCTIONS YOU RECEIVED FROM YOUR SURGEON'S OFFICE!!!   Oral Hygiene is also important to reduce your risk of infection.         Remember - BRUSH YOUR TEETH THE MORNING OF SURGERY WITH YOUR REGULAR TOOTHPASTE  Do NOT smoke after Midnight the night before surgery.  Take ONLY these medicines the morning of surgery with A SIP OF WATER: Pantoprazole (Protonix) if needed.                     You may not have any metal on your body including hair pins, jewelry, and body piercing  Do not wear make-up, lotions, powders, perfumes  or deodorant  Do not wear nail polish including gel and S&S, artificial / acrylic nails, or any other type of covering on natural nails including finger and toenails. If you have artificial nails, gel coating, etc., that needs to be removed by a nail salon, Please have this removed prior to surgery. Not doing so may mean that your surgery could be cancelled or delayed if the Surgeon or anesthesia staff feels like they are unable to monitor you safely.   Do not shave 48 hours prior to surgery to avoid nicks in your skin which may contribute to postoperative infections.   Patients discharged on the day of surgery will not be allowed to drive home.  Someone NEEDS to stay with you for the first 24 hours after anesthesia.  Do not bring your home medications to the hospital. The Pharmacy will dispense medications listed on your medication list to you during your admission in the Hospital.  Please read over the following fact sheets you were given: IF YOU HAVE QUESTIONS ABOUT YOUR PRE-OP INSTRUCTIONS, PLEASE CALL [phone].   [facility] - Preparing for Surgery Before surgery, you can play an important role.  Because skin is not sterile, your skin needs to be as free of germs as possible.  You can reduce the number of germs on your skin by washing with CHG (chlorahexidine gluconate) soap before surgery.  CHG is an antiseptic cleaner which kills germs and bonds with the skin to continue killing germs even after washing. Please DO NOT use if you have an allergy to CHG or antibacterial soaps.  If  your skin becomes reddened/irritated stop using the CHG and inform your nurse when you arrive at Short Stay. Do not shave (including legs and underarms) for at least 48 hours prior to the first CHG shower.  You may shave your face/neck.  Please follow these instructions carefully:  1.  Shower with CHG Soap the night before surgery and the  morning of surgery.  2.  If you choose to wash your hair, wash your hair first as usual with your normal  shampoo.  3.  After you shampoo, rinse your hair and body thoroughly to remove the shampoo.                             4.  Use CHG as you would any other liquid soap.  You can apply chg directly to the skin and wash.  Gently with a scrungie or clean washcloth.  5.  Apply the CHG Soap to your body ONLY FROM THE NECK DOWN.   Do not use on face/ open                           Wound or open sores. Avoid contact with eyes, ears mouth and genitals (private parts).                       Wash face,  Genitals (private parts) with your normal soap.             6.  Wash thoroughly, paying special attention to the area where your  surgery  will be performed.  7.  Thoroughly rinse your body with warm water from the neck down.  8.  DO NOT shower/wash with your normal soap after using and rinsing off the CHG Soap.            9.  Pat yourself dry with a clean towel.            10.  Wear clean pajamas.            11.  Place clean sheets on your bed the night of your first shower and do not  sleep with pets.  ON THE DAY OF SURGERY : Do not apply any lotions/deodorants the morning of surgery.  Please wear clean clothes to the hospital/surgery center.    FAILURE TO FOLLOW THESE INSTRUCTIONS MAY RESULT IN THE CANCELLATION OF YOUR SURGERY  PATIENT SIGNATURE_________________________________  NURSE SIGNATURE__________________________________  ________________________________________________________________________

## 2023-01-14 ENCOUNTER — Other Ambulatory Visit: Payer: Self-pay

## 2023-01-14 ENCOUNTER — Encounter (HOSPITAL_COMMUNITY)
Admission: RE | Admit: 2023-01-14 | Discharge: 2023-01-14 | Disposition: A | Discharge status: Home / Self Care | Payer: BC Managed Care – PPO | Source: Ambulatory Visit | Attending: Surgery | Admitting: Surgery

## 2023-01-14 ENCOUNTER — Encounter (HOSPITAL_COMMUNITY): Payer: Self-pay

## 2023-01-14 VITALS — BP 140/98 | HR 78 | Temp 99.0°F | Resp 18 | Ht <= 58 in | Wt 157.0 lb

## 2023-01-14 DIAGNOSIS — Z01818 Encounter for other preprocedural examination: Secondary | ICD-10-CM | POA: Diagnosis present

## 2023-01-14 DIAGNOSIS — I10 Essential (primary) hypertension: Secondary | ICD-10-CM | POA: Insufficient documentation

## 2023-01-14 DIAGNOSIS — Z8759 Personal history of other complications of pregnancy, childbirth and the puerperium: Secondary | ICD-10-CM | POA: Insufficient documentation

## 2023-01-14 HISTORY — DX: Fatty (change of) liver, not elsewhere classified: K76.0

## 2023-01-14 HISTORY — DX: Congenital malformation of lung, unspecified: Q33.9

## 2023-01-14 HISTORY — DX: Essential (primary) hypertension: I10

## 2023-01-14 HISTORY — DX: Pneumonia, unspecified organism: J18.9

## 2023-01-14 HISTORY — DX: Anxiety disorder, unspecified: F41.9

## 2023-01-14 LAB — COMPREHENSIVE METABOLIC PANEL
ALT: 21 U/L (ref 0–44)
AST: 17 U/L (ref 15–41)
Albumin: 4.3 g/dL (ref 3.5–5.0)
Alkaline Phosphatase: 58 U/L (ref 38–126)
Anion gap: 8 (ref 5–15)
BUN: 13 mg/dL (ref 6–20)
CO2: 26 mmol/L (ref 22–32)
Calcium: 8.7 mg/dL — ABNORMAL LOW (ref 8.9–10.3)
Chloride: 105 mmol/L (ref 98–111)
Creatinine, Ser: 0.72 mg/dL (ref 0.44–1.00)
GFR, Estimated: >60 mL/min (ref >60)
Glucose, Bld: 97 mg/dL (ref 70–99)
Potassium: 3.7 mmol/L (ref 3.5–5.1)
Sodium: 139 mmol/L (ref 135–145)
Total Bilirubin: 0.6 mg/dL (ref 0.3–1.2)
Total Protein: 7.6 g/dL (ref 6.5–8.1)

## 2023-01-14 LAB — CBC
HCT: 43.5 % (ref 36.0–46.0)
Hemoglobin: 14.3 g/dL (ref 12.0–15.0)
MCH: 30.6 pg (ref 26.0–34.0)
MCHC: 32.9 g/dL (ref 30.0–36.0)
MCV: 93.1 fL (ref 80.0–100.0)
Platelets: 286 10*3/uL (ref 150–400)
RBC: 4.67 MIL/uL (ref 3.87–5.11)
RDW: 13 % (ref 11.5–15.5)
WBC: 8.2 10*3/uL (ref 4.0–10.5)
nRBC: 0 % (ref 0.0–0.2)

## 2023-01-22 ENCOUNTER — Ambulatory Visit (HOSPITAL_COMMUNITY): Payer: BC Managed Care – PPO | Admitting: Registered Nurse

## 2023-01-22 ENCOUNTER — Other Ambulatory Visit: Payer: Self-pay

## 2023-01-22 ENCOUNTER — Ambulatory Visit (HOSPITAL_COMMUNITY): Payer: BC Managed Care – PPO | Admitting: Physician Assistant

## 2023-01-22 ENCOUNTER — Encounter (HOSPITAL_COMMUNITY)
Admission: RE | Disposition: A | Discharge status: Home / Self Care | Payer: Self-pay | Source: Ambulatory Visit | Attending: Surgery

## 2023-01-22 ENCOUNTER — Encounter (HOSPITAL_COMMUNITY): Payer: Self-pay | Admitting: Surgery

## 2023-01-22 ENCOUNTER — Ambulatory Visit (HOSPITAL_COMMUNITY)
Admission: RE | Admit: 2023-01-22 | Discharge: 2023-01-22 | Disposition: A | Discharge status: Home / Self Care | Payer: BC Managed Care – PPO | Source: Ambulatory Visit | Attending: Surgery | Admitting: Surgery

## 2023-01-22 DIAGNOSIS — Z01818 Encounter for other preprocedural examination: Secondary | ICD-10-CM

## 2023-01-22 DIAGNOSIS — K8044 Calculus of bile duct with chronic cholecystitis without obstruction: Secondary | ICD-10-CM | POA: Diagnosis present

## 2023-01-22 DIAGNOSIS — K8060 Calculus of gallbladder and bile duct with cholecystitis, unspecified, without obstruction: Secondary | ICD-10-CM | POA: Insufficient documentation

## 2023-01-22 HISTORY — PX: CHOLECYSTECTOMY: SHX55

## 2023-01-22 LAB — POCT PREGNANCY, URINE: Preg Test, Ur: NEGATIVE

## 2023-01-22 SURGERY — LAPAROSCOPIC CHOLECYSTECTOMY
Anesthesia: General

## 2023-01-22 MED ORDER — BUPIVACAINE-EPINEPHRINE 0.25% -1:200000 IJ SOLN
INTRAMUSCULAR | Status: DC | PRN
  Administered 2023-01-22: 40 mL

## 2023-01-22 MED ORDER — INDOCYANINE GREEN 25 MG IV SOLR
7.5000 mg | Freq: Once | INTRAVENOUS | Status: AC
  Administered 2023-01-22: 7.5 mg via INTRAVENOUS
  Filled 2023-01-22: qty 10

## 2023-01-22 MED ORDER — CHLORHEXIDINE GLUCONATE 0.12 % MT SOLN
15.0000 mL | Freq: Once | OROMUCOSAL | Status: AC
  Administered 2023-01-22: 15 mL via OROMUCOSAL

## 2023-01-22 MED ORDER — GABAPENTIN 300 MG PO CAPS
300.0000 mg | ORAL_CAPSULE | ORAL | Status: AC
  Administered 2023-01-22: 300 mg via ORAL
  Filled 2023-01-22: qty 1

## 2023-01-22 MED ORDER — MIDAZOLAM HCL 5 MG/5ML IJ SOLN
INTRAMUSCULAR | Status: DC | PRN
  Administered 2023-01-22: 2 mg via INTRAVENOUS

## 2023-01-22 MED ORDER — FENTANYL CITRATE PF 50 MCG/ML IJ SOSY
PREFILLED_SYRINGE | INTRAMUSCULAR | Status: AC
  Administered 2023-01-22: 25 ug via INTRAVENOUS
  Filled 2023-01-22: qty 2

## 2023-01-22 MED ORDER — TRAMADOL HCL 50 MG PO TABS: 50.0000 mg | ORAL_TABLET | Freq: Four times a day (QID) | ORAL | 0 refills | Status: AC | PRN

## 2023-01-22 MED ORDER — LACTATED RINGERS IV SOLN: INTRAVENOUS | Status: DC

## 2023-01-22 MED ORDER — BUPIVACAINE-EPINEPHRINE 0.25% -1:200000 IJ SOLN
INTRAMUSCULAR | Status: AC
  Filled 2023-01-22: qty 1

## 2023-01-22 MED ORDER — MIDAZOLAM HCL 2 MG/2ML IJ SOLN
INTRAMUSCULAR | Status: AC
  Filled 2023-01-22: qty 2

## 2023-01-22 MED ORDER — ONDANSETRON HCL 4 MG/2ML IJ SOLN
INTRAMUSCULAR | Status: AC
  Filled 2023-01-22: qty 2

## 2023-01-22 MED ORDER — BUPIVACAINE LIPOSOME 1.3 % IJ SUSP: 20.0000 mL | Freq: Once | INTRAMUSCULAR | Status: DC

## 2023-01-22 MED ORDER — ACETAMINOPHEN 325 MG PO TABS: 650.0000 mg | ORAL_TABLET | ORAL | Status: DC | PRN

## 2023-01-22 MED ORDER — ORAL CARE MOUTH RINSE: 15.0000 mL | Freq: Once | OROMUCOSAL | Status: AC

## 2023-01-22 MED ORDER — FENTANYL CITRATE (PF) 100 MCG/2ML IJ SOLN
INTRAMUSCULAR | Status: AC
  Filled 2023-01-22: qty 2

## 2023-01-22 MED ORDER — EPHEDRINE 5 MG/ML INJ
INTRAVENOUS | Status: AC
  Filled 2023-01-22: qty 5

## 2023-01-22 MED ORDER — ONDANSETRON 4 MG PO TBDP: 4.0000 mg | ORAL_TABLET | Freq: Three times a day (TID) | ORAL | 0 refills | Status: DC | PRN

## 2023-01-22 MED ORDER — CHLORHEXIDINE GLUCONATE 4 % EX LIQD: 60.0000 mL | Freq: Once | CUTANEOUS | Status: DC

## 2023-01-22 MED ORDER — POVIDONE-IODINE 10 % EX SOLN
CUTANEOUS | Status: DC | PRN
  Administered 2023-01-22: 1 via TOPICAL

## 2023-01-22 MED ORDER — CEFAZOLIN SODIUM-DEXTROSE 2-4 GM/100ML-% IV SOLN
2.0000 g | INTRAVENOUS | Status: AC
  Administered 2023-01-22: 2 g via INTRAVENOUS
  Filled 2023-01-22: qty 100

## 2023-01-22 MED ORDER — DEXAMETHASONE SODIUM PHOSPHATE 10 MG/ML IJ SOLN
INTRAMUSCULAR | Status: AC
  Filled 2023-01-22: qty 1

## 2023-01-22 MED ORDER — DEXAMETHASONE SODIUM PHOSPHATE 10 MG/ML IJ SOLN
INTRAMUSCULAR | Status: DC | PRN
  Administered 2023-01-22: 8 mg via INTRAVENOUS

## 2023-01-22 MED ORDER — FENTANYL CITRATE PF 50 MCG/ML IJ SOSY
25.0000 ug | PREFILLED_SYRINGE | INTRAMUSCULAR | Status: DC | PRN
  Administered 2023-01-22: 50 ug via INTRAVENOUS
  Administered 2023-01-22: 25 ug via INTRAVENOUS

## 2023-01-22 MED ORDER — PROPOFOL 10 MG/ML IV BOLUS
INTRAVENOUS | Status: DC | PRN
  Administered 2023-01-22: 150 mg via INTRAVENOUS

## 2023-01-22 MED ORDER — CHLORHEXIDINE GLUCONATE 4 % EX LIQD
60.0000 mL | Freq: Once | CUTANEOUS | Status: DC
  Filled 2023-01-22: qty 60

## 2023-01-22 MED ORDER — ROCURONIUM BROMIDE 10 MG/ML (PF) SYRINGE
PREFILLED_SYRINGE | INTRAVENOUS | Status: AC
  Filled 2023-01-22: qty 10

## 2023-01-22 MED ORDER — OXYCODONE HCL 5 MG PO TABS
ORAL_TABLET | ORAL | Status: AC
  Filled 2023-01-22: qty 1

## 2023-01-22 MED ORDER — SUGAMMADEX SODIUM 200 MG/2ML IV SOLN
INTRAVENOUS | Status: DC | PRN
  Administered 2023-01-22: 160 mg via INTRAVENOUS

## 2023-01-22 MED ORDER — FENTANYL CITRATE (PF) 100 MCG/2ML IJ SOLN
INTRAMUSCULAR | Status: DC | PRN
  Administered 2023-01-22 (×4): 25 ug via INTRAVENOUS
  Administered 2023-01-22 (×2): 50 ug via INTRAVENOUS

## 2023-01-22 MED ORDER — ACETAMINOPHEN 650 MG RE SUPP
650.0000 mg | RECTAL | Status: DC | PRN
  Filled 2023-01-22: qty 1

## 2023-01-22 MED ORDER — FENTANYL CITRATE PF 50 MCG/ML IJ SOSY: 25.0000 ug | PREFILLED_SYRINGE | INTRAMUSCULAR | Status: DC | PRN

## 2023-01-22 MED ORDER — OXYCODONE HCL 5 MG PO TABS
5.0000 mg | ORAL_TABLET | ORAL | Status: DC | PRN
  Administered 2023-01-22: 5 mg via ORAL

## 2023-01-22 MED ORDER — ONDANSETRON HCL 4 MG/2ML IJ SOLN
INTRAMUSCULAR | Status: DC | PRN
  Administered 2023-01-22: 4 mg via INTRAVENOUS

## 2023-01-22 MED ORDER — PROPOFOL 10 MG/ML IV BOLUS
INTRAVENOUS | Status: AC
  Filled 2023-01-22: qty 20

## 2023-01-22 MED ORDER — ROCURONIUM BROMIDE 10 MG/ML (PF) SYRINGE
PREFILLED_SYRINGE | INTRAVENOUS | Status: DC | PRN
  Administered 2023-01-22: 50 mg via INTRAVENOUS

## 2023-01-22 MED ORDER — LIDOCAINE 2% (20 MG/ML) 5 ML SYRINGE
INTRAMUSCULAR | Status: DC | PRN
  Administered 2023-01-22: 60 mg via INTRAVENOUS

## 2023-01-22 MED ORDER — EPHEDRINE SULFATE-NACL 50-0.9 MG/10ML-% IV SOSY
PREFILLED_SYRINGE | INTRAVENOUS | Status: DC | PRN
  Administered 2023-01-22: 5 mg via INTRAVENOUS

## 2023-01-22 MED ORDER — DOCUSATE SODIUM 100 MG PO CAPS: 100.0000 mg | ORAL_CAPSULE | Freq: Two times a day (BID) | ORAL | 0 refills | Status: AC

## 2023-01-22 MED ORDER — LACTATED RINGERS IR SOLN
Status: DC | PRN
  Administered 2023-01-22: 1000 mL

## 2023-01-22 MED ORDER — ACETAMINOPHEN 500 MG PO TABS
1000.0000 mg | ORAL_TABLET | ORAL | Status: AC
  Administered 2023-01-22: 1000 mg via ORAL
  Filled 2023-01-22: qty 2

## 2023-01-22 SURGICAL SUPPLY — 47 items
ADH SKN CLS APL DERMABOND .7 (GAUZE/BANDAGES/DRESSINGS) ×1
APL PRP STRL LF DISP 70% ISPRP (MISCELLANEOUS) ×1
APPLIER CLIP ROT 10 11.4 M/L (STAPLE) ×1
APR CLP MED LRG 11.4X10 (STAPLE) ×1
BAG COUNTER SPONGE SURGICOUNT (BAG) IMPLANT
BAG SPEC RTRVL 10 TROC 200 (ENDOMECHANICALS) ×1
BAG SPNG CNTER NS LX DISP (BAG)
CABLE HIGH FREQUENCY MONO STRZ (ELECTRODE) ×1 IMPLANT
CHLORAPREP W/TINT 26 (MISCELLANEOUS) ×1 IMPLANT
CLIP APPLIE ROT 10 11.4 M/L (STAPLE) ×1 IMPLANT
COVER MAYO STAND STRL (DRAPES) IMPLANT
COVER SURGICAL LIGHT HANDLE (MISCELLANEOUS) ×1 IMPLANT
DERMABOND ADVANCED .7 DNX12 (GAUZE/BANDAGES/DRESSINGS) ×1 IMPLANT
DRAPE C-ARM 42X120 X-RAY (DRAPES) IMPLANT
ELECT REM PT RETURN 15FT ADLT (MISCELLANEOUS) ×1 IMPLANT
GLOVE BIO SURGEON STRL SZ 6 (GLOVE) ×1 IMPLANT
GLOVE INDICATOR 6.5 STRL GRN (GLOVE) ×1 IMPLANT
GLOVE SS BIOGEL STRL SZ 6 (GLOVE) ×1 IMPLANT
GOWN STRL REUS W/ TWL LRG LVL3 (GOWN DISPOSABLE) ×1 IMPLANT
GOWN STRL REUS W/ TWL XL LVL3 (GOWN DISPOSABLE) IMPLANT
GOWN STRL REUS W/TWL LRG LVL3 (GOWN DISPOSABLE) ×1
GOWN STRL REUS W/TWL XL LVL3 (GOWN DISPOSABLE)
GRASPER SUT TROCAR 14GX15 (MISCELLANEOUS) ×1 IMPLANT
HEMOSTAT SNOW SURGICEL 2X4 (HEMOSTASIS) IMPLANT
IRRIG SUCT STRYKERFLOW 2 WTIP (MISCELLANEOUS) ×1
IRRIGATION SUCT STRKRFLW 2 WTP (MISCELLANEOUS) ×1 IMPLANT
KIT BASIN OR (CUSTOM PROCEDURE TRAY) ×1 IMPLANT
KIT TURNOVER KIT A (KITS) IMPLANT
NDL INSUFFLATION 14GA 120MM (NEEDLE) ×1 IMPLANT
NEEDLE INSUFFLATION 14GA 120MM (NEEDLE) ×1 IMPLANT
PENCIL SMOKE EVACUATOR (MISCELLANEOUS) IMPLANT
POUCH RETRIEVAL ECOSAC 10 (ENDOMECHANICALS) IMPLANT
SCISSORS LAP 5X35 DISP (ENDOMECHANICALS) ×1 IMPLANT
SET CHOLANGIOGRAPH MIX (MISCELLANEOUS) IMPLANT
SET TUBE SMOKE EVAC HIGH FLOW (TUBING) ×1 IMPLANT
SLEEVE Z-THREAD 5X100MM (TROCAR) ×1 IMPLANT
SPIKE FLUID TRANSFER (MISCELLANEOUS) ×1 IMPLANT
SUT MNCRL AB 4-0 PS2 18 (SUTURE) ×1 IMPLANT
SUT VICRYL 0 TIES 12 18 (SUTURE) IMPLANT
SYS BAG RETRIEVAL 10MM (BASKET) ×1
SYSTEM BAG RETRIEVAL 10MM (BASKET) ×1 IMPLANT
TOWEL OR 17X26 10 PK STRL BLUE (TOWEL DISPOSABLE) ×1 IMPLANT
TOWEL OR NON WOVEN STRL DISP B (DISPOSABLE) IMPLANT
TRAY LAPAROSCOPIC (CUSTOM PROCEDURE TRAY) ×1 IMPLANT
TROCAR ADV FIXATION 12X100MM (TROCAR) ×1 IMPLANT
TROCAR XCEL NON-BLD 5MMX100MML (ENDOMECHANICALS) IMPLANT
TROCAR Z-THREAD OPTICAL 5X100M (TROCAR) ×1 IMPLANT

## 2023-01-22 NOTE — Discharge Instructions (Signed)
LAPAROSCOPIC SURGERY: POST OP INSTRUCTIONS   EAT Gradually transition to a high fiber diet with a fiber supplement over the next few weeks after discharge.  WALK Walk an hour a day (cumulative- not all at once).  Control your pain to do that.    CONTROL PAIN Control pain so that you can walk, sleep, tolerate sneezing/coughing, go up/down stairs.  HAVE A BOWEL MOVEMENT DAILY Keep your bowels regular to avoid problems.  OK to try a laxative to override constipation.  OK to use an antidiarrheal to slow down diarrhea.  Call if not better after 2 tries  CALL IF YOU HAVE PROBLEMS/CONCERNS Call if you are still struggling despite following these instructions. Call if you have concerns not answered by these instructions    DIET: Follow a light bland diet & liquids the first 24 hours after arrival home, such as soup, liquids, starches, etc.  Be sure to drink plenty of fluids.  Quickly advance to a usual solid diet within a few days.  Avoid fast food or heavy meals initially as you are more likely to get nauseated or have irregular bowels.  A low-sugar, high-fiber diet for the rest of your life is ideal.  Take your usually prescribed home medications unless otherwise directed.  PAIN CONTROL: Pain is best controlled by a usual combination of three different methods TOGETHER: Ice/Heat Over the counter pain medication Prescription pain medication Most patients will experience some swelling and bruising around the incisions.  Ice packs or heating pads (30-60 minutes up to 6 times a day) will help. Use ice for the first few days to help decrease swelling and bruising, then switch to heat to help relax tight/sore spots and speed recovery.  Some people prefer to use ice alone, heat alone, alternating between ice & heat.  Experiment to what works for you.  Swelling and bruising can take several weeks to resolve.   It is helpful to take an over-the-counter pain medication regularly for the first few  days: Naproxen (Aleve, etc)  Two 220mg tabs twice a day OR Ibuprofen (Advil, etc) Three 200mg tabs four times a day (every meal & bedtime) AND Acetaminophen (Tylenol, etc) 500-650mg four times a day (every meal & bedtime) A  prescription for pain medication (such as oxycodone, hydrocodone, tramadol, gabapentin, methocarbamol, etc) should be given to you upon discharge.  Take your pain medication as prescribed, IF NEEDED.  If you are having problems/concerns with the prescription medicine (does not control pain, nausea, vomiting, rash, itching, etc), please call us [phone] to see if we need to switch you to a different pain medicine that will work better for you and/or control your side effect better. If you need a refill on your pain medication, please give us 48 hour notice.  contact your pharmacy.  They will contact our office to request authorization. Prescriptions will not be filled after 5 pm or on week-ends  Avoid getting constipated.   Between the surgery and the pain medications, it is common to experience some constipation.   Increasing fluid intake and taking a fiber supplement (such as Metamucil, Citrucel, FiberCon, MiraLax, etc) 1-2 times a day regularly will usually help prevent this problem from occurring.   A mild laxative (prune juice, Milk of Magnesia, MiraLax, etc) should be taken according to package directions if there are no bowel movements after 48 hours.   Watch out for diarrhea.   If you have many loose bowel movements, simplify your diet to bland foods & liquids   for a few days.   Stop any stool softeners and decrease your fiber supplement.   Switching to mild anti-diarrheal medications (Kayopectate, Pepto Bismol) can help.   If this worsens or does not improve, please call us.  Wash / shower every day.  You may shower over the skin glue which is waterproof.  Do not soak or submerge incisions.  No rubbing, scrubbing, lotions or ointments to incisions.  Glue will  flake off after about 2 weeks.  You may leave the incision open to air.  You may replace a dressing/Band-Aid to cover the incision for comfort if you wish.   ACTIVITIES as tolerated:   You may resume regular (light) daily activities beginning the next day--such as daily self-care, walking, climbing stairs--gradually increasing activities as tolerated.  If you can walk 30 minutes without difficulty, it is safe to try more intense activity such as jogging, treadmill, bicycling, low-impact aerobics, swimming, etc. Save the most intensive and strenuous activity for last such as sit-ups, heavy lifting, contact sports, etc  Refrain from any heavy lifting or straining until you are off narcotics for pain control.   DO NOT PUSH THROUGH PAIN.  Let pain be your guide: If it hurts to do something, don't do it.  Pain is your body warning you to avoid that activity for another week until the pain goes down. You may drive when you are no longer taking prescription pain medication, you can comfortably wear a seatbelt, and you can safely maneuver your car and apply brakes. You may have sexual intercourse when it is comfortable.  FOLLOW UP in our office Please call [facility] at [phone] to set up an appointment to see your surgeon in the office for a follow-up appointment approximately 2-3 weeks after your surgery. Make sure that you call for this appointment the day you arrive home to insure a convenient appointment time.  10. IF YOU HAVE DISABILITY OR FAMILY LEAVE FORMS, BRING THEM TO THE OFFICE FOR PROCESSING.  DO NOT GIVE THEM TO YOUR DOCTOR.   WHEN TO CALL US [phone]: Poor pain control Reactions / problems with new medications (rash/itching, nausea, etc)  Fever over 101.5 F (38.5 C) Inability to urinate Nausea and/or vomiting Worsening swelling or bruising Continued bleeding from incision. Increased pain, redness, or drainage from the incision   The clinic staff is available to answer your  questions during regular business hours (8:30am-5pm).  Please don't hesitate to call and ask to speak to one of our nurses for clinical concerns.   If you have a medical emergency, go to the nearest emergency room or call 911.  A surgeon from [facility] is always on call at the hospitals   [facility] [address], [address], [place], [place]  [address] ? MAIN: [phone] ? TOLL FREE: [phone] ?  FAX [phone] [email or web address]  

## 2023-01-22 NOTE — Op Note (Signed)
Operative Note  [name] 42 y.o. female [id number]  01/22/2023  Surgeon: Berna Bue MD FACS I was personally present during the key and critical portions of this procedure and immediately available throughout the entire procedure, as documented in my operative note.  Assistant: Margarito Courser MD ([id number])    Procedure performed: Laparoscopic Cholecystectomy near infrared fluorescent cholangiography  Preop diagnosis: Biliary colic, chronic cholecystitis Post-op diagnosis/intraop findings: same  Specimens: gallbladder  Retained items: none  EBL: minimal  Complications: none  Description of procedure: After obtaining informed consent the patient was brought to the operating room. Antibiotics were administered. SCD's were applied. General endotracheal anesthesia was initiated and a formal time-out was performed. The abdomen was prepped and draped in the usual sterile fashion and the abdomen was entered using an infraumbilical veress needle after instilling the site with local. Insufflation to was obtained, 5mm trocar and camera inserted, and gross inspection revealed no evidence of injury from our entry or other intraabdominal abnormalities. Two 5mm trocars were introduced in the right midclavicular and right anterior axillary lines under direct visualization and following infiltration with local. An 11mm trocar was placed in the epigastrium. The gallbladder fundus was retracted cephalad and the infundibulum was retracted laterally. A combination of hook electrocautery and blunt dissection was utilized to clear the peritoneum from the neck and cystic duct, circumferentially isolating the cystic artery and cystic duct and lifting the gallbladder from the cystic plate. The critical view of safety was achieved with the cystic artery, cystic duct, and liver bed visualized between them with no other structures.  Near infrared fluorescent cholangiography was enacted and confirmed appropriate  anatomy.  This also illuminated the common bile duct which was well away from the area of dissection.  The cystic artery was clipped with a single clip proximally and distally and divided as was the cystic duct with three clips on the proximal end. The gallbladder was dissected from the liver plate using electrocautery. Once freed the gallbladder was placed in an endocatch bag and removed through the epigastric trocar site. A small amount of bleeding on the liver bed was controlled with cautery. Some bile had been spilled from the gallbladder during its dissection from the liver bed. This was aspirated and the right upper quadrant was irrigated with 1 L of warm sterile saline; the effluent was clear. Hemostasis was once again confirmed, and reinspection of the abdomen revealed no injuries. The clips were well opposed without any bile leak from the duct or the liver bed. The 11mm trocar site in the epigastrium, which did have to be extended in order to extract the large stones within the patient's gallbladder, was closed with 3 simple interrupted 0 vicryls in the fascia under direct visualization using a PMI device. The abdomen was desufflated and all trocars removed. The skin incisions were closed with running subcuticular monocryl and Dermabond. The patient was awakened, extubated and transported to the recovery room in stable condition.    All counts were correct at the completion of the case.

## 2023-01-22 NOTE — H&P (Signed)
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 Beavers and Colleen Kennedy Smith, NP at  GI for recent workup of abdominal pain and nausea.  She states that her symptoms began in approximately 2018.  She has intermittent episodes, several times per year, Of severe subxiphoid and lower chest pain, radiating to her back associated with nausea.  These last anywhere from 30 minutes to hours.  She has nausea most days, most noticeable after eating lunch or dinner.  No emesis.  No known fevers or jaundice.  She does note that the symptoms are exacerbated by high-fat meals such as [employer].  She also has diarrhea with fatty foods.  She is on PPI and Pepcid.  She works at a bank.  Right upper quadrant ultrasound 06/06/2022: Multiple gallstones up to 1.6 cm, no sonographic evidence of cholecystitis, common bile duct 3 mm, hepatic steatosis.  Labs 07/26/2022 including CMP, CBC unremarkable.  IgA borderline elevated at 317.  Screening colonoscopy by Dr. Beavers 09/06/2022: 2 sessile transverse colon polyps, sigmoid descending diverticulosis with an area of congested and erythematous mucosa in the sigmoid.  Path tubular adenomas without dysplasia or carcinoma, benign colonic mucosa from the erythematous region.   EGD by Dr. Beavers 09/06/2022: Esophagus normal, diffuse mildly erythematous mucosa in the gastric body, duodenum normal, cardia and fundus normal on retroflexion.  Path with peptic duodenitis, reactive gastropathy negative  for H. pylori or intestinal metaplasia/dysplasia, reactive squamous mucosa in the distal esophagus  Review of Systems: A complete review of systems was obtained from the patient.  I have reviewed this information and discussed as appropriate with the patient.  See HPI as well for other ROS.   Medical History: Past Medical History:  Diagnosis Date   GERD (gastroesophageal reflux disease)    Hyperlipidemia     There is no problem list on file for this patient.   Past Surgical History:  Procedure Laterality Date   CESAREAN SECTION       Allergies  Allergen Reactions   Amoxicillin Rash   Cinnamon Rash    Current Outpatient Medications on File Prior to Visit  Medication Sig Dispense Refill   famotidine (PEPCID) 40 MG tablet Take 40 mg by mouth at bedtime     hyoscyamine (LEVSIN/SL) 0.125 mg SL tablet Place under the tongue     pantoprazole (PROTONIX) 40 MG DR tablet Take 1 tablet by mouth 2 (two) times daily     No current facility-administered medications on file prior to visit.    Family History  Problem Relation Age of Onset   Cancer Mother    Seizures Father      Social History   Tobacco Use  Smoking Status Never  Smokeless Tobacco Never     Social History   Socioeconomic History   Marital status: Married  Tobacco Use   Smoking status: Never   Smokeless tobacco: Never  Vaping Use   Vaping Use: Never used  Substance and Sexual Activity   Alcohol use: Yes   Drug use: Never    Objective:      Vitals:   10/30/22 1324  BP: (!) 164/98  Pulse: 76  Temp: 36.7 C (98 F)  SpO2: 98%  Weight: 72.1 kg (159 lb)  Height: 147.3 cm (4' 10")    Body mass index is 33.23 kg/m.  Gen: A&Ox3, no distress  Chest: respiratory effort is normal.   Cardiovascular: RRR  Gastrointestinal: soft, nondistended, mildly tender along the right hemiabdomen. No mass, hepatomegaly or splenomegaly. No hernia. Psych: appropriate mood and affect, normal insight/judgment intact   Skin: warm and dry    Assessment and Plan:  Diagnoses and all orders for this visit:  Biliary colic  Chronic cholecystitis     I recommend proceeding with laparoscopic or robotic cholecystectomy with possible cholangiogram.  Discussed the relevant anatomy using a diagram to demonstrate, and went over surgical technique.  Discussed risks of surgery including bleeding, pain, scarring, intraabdominal injury specifically to the common bile duct and sequelae, subtotal cholecystectomy, bile leak, conversion to open surgery, failure to resolve symptoms, general cardiovascular/thromboembolic/ pulmonary complications. Questions welcomed and answered to patient's satisfaction.  Patient wishes to proceed with surgery.   Mccoy Testa AMANDA Jamisen Duerson, MD   

## 2023-01-22 NOTE — Transfer of Care (Signed)
Immediate Anesthesia Transfer of Care Note  Patient: Faith Evans  Procedure(s) Performed: LAPAROSCOPIC CHOLECYSTECTOMY WITH ICG DYE  Patient Location: PACU  Anesthesia Type:General  Level of Consciousness: awake, alert , oriented, and patient cooperative  Airway & Oxygen Therapy: Patient Spontanous Breathing and Patient connected to face mask oxygen  Post-op Assessment: Report given to RN, Post -op Vital signs reviewed and stable, and Patient moving all extremities X 4  Post vital signs: Reviewed and stable  Last Vitals:  Vitals Value Taken Time  BP 145/85 01/22/23 1016  Temp    Pulse 96 01/22/23 1018  Resp 27 01/22/23 1018  SpO2 100 % 01/22/23 1018  Vitals shown include unvalidated device data.  Last Pain:  Vitals:   01/22/23 0646  TempSrc: Oral         Complications: No notable events documented.

## 2023-01-22 NOTE — Anesthesia Procedure Notes (Signed)
Procedure Name: Intubation Date/Time: 01/22/2023 8:34 AM  Performed by: Elisabeth Cara, CRNAPre-anesthesia Checklist: Patient identified, Emergency Drugs available, Suction available, Patient being monitored and Timeout performed Patient Re-evaluated:Patient Re-evaluated prior to induction Oxygen Delivery Method: Circle system utilized Preoxygenation: Pre-oxygenation with 100% oxygen Induction Type: IV induction Ventilation: Mask ventilation without difficulty Laryngoscope Size: Mac and 4 Grade View: Grade I Tube type: Oral Tube size: 7.0 mm Number of attempts: 1 Airway Equipment and Method: Stylet Placement Confirmation: ETT inserted through vocal cords under direct vision, positive ETCO2 and breath sounds checked- equal and bilateral Secured at: 21 cm Tube secured with: Tape Dental Injury: Teeth and Oropharynx as per pre-operative assessment

## 2023-01-22 NOTE — Anesthesia Preprocedure Evaluation (Addendum)
Anesthesia Evaluation  Patient identified by MRN, date of birth, ID band Patient awake    Reviewed: Allergy & Precautions, NPO status , Patient's Chart, lab work & pertinent test results  Airway Mallampati: III  TM Distance: >3 FB Neck ROM: Full    Dental no notable dental hx. (+) Teeth Intact, Dental Advisory Given   Pulmonary neg pulmonary ROS   Pulmonary exam normal breath sounds clear to auscultation       Cardiovascular hypertension, Normal cardiovascular exam Rhythm:Regular Rate:Normal     Neuro/Psych  PSYCHIATRIC DISORDERS Anxiety     negative neurological ROS     GI/Hepatic Neg liver ROS,GERD  ,,  Endo/Other  negative endocrine ROS    Renal/GU negative Renal ROS  negative genitourinary   Musculoskeletal negative musculoskeletal ROS (+)    Abdominal   Peds  Hematology negative hematology ROS (+)   Anesthesia Other Findings   Reproductive/Obstetrics                             Anesthesia Physical Anesthesia Plan  ASA: 2  Anesthesia Plan: General   Post-op Pain Management: Tylenol PO (pre-op)*   Induction: Intravenous  PONV Risk Score and Plan: Midazolam, Dexamethasone and Ondansetron  Airway Management Planned: Oral ETT  Additional Equipment:   Intra-op Plan:   Post-operative Plan: Extubation in OR  Informed Consent: I have reviewed the patients History and Physical, chart, labs and discussed the procedure including the risks, benefits and alternatives for the proposed anesthesia with the patient or authorized representative who has indicated his/her understanding and acceptance.     Dental advisory given  Plan Discussed with: CRNA  Anesthesia Plan Comments:        Anesthesia Quick Evaluation

## 2023-01-23 ENCOUNTER — Encounter (HOSPITAL_COMMUNITY): Payer: Self-pay | Admitting: Surgery

## 2023-01-23 LAB — SURGICAL PATHOLOGY

## 2023-01-26 NOTE — Anesthesia Postprocedure Evaluation (Signed)
Anesthesia Post Note  Patient: Faith Evans  Procedure(s) Performed: LAPAROSCOPIC CHOLECYSTECTOMY WITH ICG DYE     Patient location during evaluation: PACU Anesthesia Type: General Level of consciousness: awake and alert Pain management: pain level controlled Vital Signs Assessment: post-procedure vital signs reviewed and stable Respiratory status: spontaneous breathing, nonlabored ventilation, respiratory function stable and patient connected to nasal cannula oxygen Cardiovascular status: blood pressure returned to baseline and stable Postop Assessment: no apparent nausea or vomiting Anesthetic complications: no  No notable events documented.  Last Vitals:  Vitals:   01/22/23 1100 01/22/23 1115  BP: 121/81 119/78  Pulse: 76 89  Resp: 14 17  Temp:    SpO2: 94% 95%    Last Pain:  Vitals:   01/22/23 1115  TempSrc:   PainSc: 6                  Jolea Dolle L Melana Hingle

## 2023-01-30 ENCOUNTER — Ambulatory Visit: Payer: BC Managed Care – PPO | Admitting: Nurse Practitioner

## 2023-03-06 ENCOUNTER — Ambulatory Visit (HOSPITAL_BASED_OUTPATIENT_CLINIC_OR_DEPARTMENT_OTHER)
Admission: RE | Admit: 2023-03-06 | Discharge: 2023-03-06 | Disposition: A | Discharge status: Home / Self Care | Payer: BC Managed Care – PPO | Source: Ambulatory Visit | Attending: Family Medicine | Admitting: Family Medicine

## 2023-03-06 ENCOUNTER — Ambulatory Visit: Payer: BC Managed Care – PPO | Admitting: Family Medicine

## 2023-03-06 VITALS — BP 153/100 | HR 104 | Temp 98.3°F | Ht <= 58 in | Wt 163.0 lb

## 2023-03-06 DIAGNOSIS — R350 Frequency of micturition: Secondary | ICD-10-CM | POA: Diagnosis not present

## 2023-03-06 DIAGNOSIS — K5792 Diverticulitis of intestine, part unspecified, without perforation or abscess without bleeding: Secondary | ICD-10-CM | POA: Diagnosis not present

## 2023-03-06 DIAGNOSIS — R1030 Lower abdominal pain, unspecified: Secondary | ICD-10-CM

## 2023-03-06 LAB — POC URINALSYSI DIPSTICK (AUTOMATED)
Blood, UA: NEGATIVE
Glucose, UA: NEGATIVE
Ketones, UA: NEGATIVE
Leukocytes, UA: NEGATIVE
Nitrite, UA: NEGATIVE
Protein, UA: POSITIVE — AB
Spec Grav, UA: 1.02 (ref 1.010–1.025)
Urobilinogen, UA: 1 E.U./dL
pH, UA: 6 (ref 5.0–8.0)

## 2023-03-06 MED ORDER — IOHEXOL 300 MG/ML  SOLN
100.0000 mL | Freq: Once | INTRAMUSCULAR | Status: AC | PRN
  Administered 2023-03-06: 100 mL via INTRAVENOUS

## 2023-03-06 NOTE — Progress Notes (Signed)
Acute Office Visit  Subjective:     Patient ID: Faith Evans, female    DOB: Jul 07, 1981, 42 y.o.   MRN: [id number]  Chief Complaint  Patient presents with   Abdominal Pain    Patient is in today for lower abd pain.   Discussed the use of AI scribe software for clinical note transcription with the patient, who gave verbal consent to proceed.  History of Present Illness   The patient, with a history of gallbladder removal approximately six weeks ago, presents with severe lower abdominal pain, predominantly on the left side. The pain, initially tolerable, began in the lower left quadrant but has since become unbearable, spreading across the entire lower abdomen. The pain is so severe that even the pressure from clothing or light touch exacerbates it.  The patient reports increased urinary frequency, particularly noticeable overnight, with four to five episodes between midnight and 7 am. They describe the sensation as if they have been holding their urine for an extended period, even though they had urinated just an hour prior. The patient notes that the volume of urine has decreased over time, but they do not feel like they are retaining urine. There is no associated dysuria.  The patient denies any upper abdominal pain, constipation, vomiting, diarrhea, or fever. However, they report feeling nauseous when the pain becomes severe. The patient also denies any vaginal pain, bleeding, or spotting. They currently have an intrauterine device (IUD) in place and are sexually active.  The patient's pain is so severe that it affects their mobility, making it difficult to walk. The use of a heating pad provides minimal relief. The patient has not experienced any vomiting, diarrhea, or fever, but reports feeling nauseous when the pain intensifies.          All review of systems negative except what is listed in the HPI      Objective:    BP (!) 153/100   Pulse (!) 104   Temp 98.3 F  (36.8 C) (Oral)   Ht 4\' 10"  (1.473 m)   Wt 163 lb (73.9 kg)   SpO2 100%   BMI 34.07 kg/m    Physical Exam Vitals reviewed.  Constitutional:      Appearance: Normal appearance.  Cardiovascular:     Rate and Rhythm: Normal rate and regular rhythm.     Pulses: Normal pulses.     Heart sounds: Normal heart sounds.  Pulmonary:     Effort: Pulmonary effort is normal.     Breath sounds: Normal breath sounds.  Abdominal:     Tenderness: There is abdominal tenderness in the right lower quadrant, suprapubic area and left lower quadrant. There is no right CVA tenderness or left CVA tenderness.  Skin:    General: Skin is warm and dry.  Neurological:     Mental Status: She is alert and oriented to person, place, and time.  Psychiatric:        Mood and Affect: Mood normal.        Behavior: Behavior normal.        Thought Content: Thought content normal.        Judgment: Judgment normal.     Results for orders placed or performed in visit on 03/06/23  POCT Urinalysis Dipstick (Automated)  Result Value Ref Range   Color, UA yellow    Clarity, UA clear    Glucose, UA Negative Negative   Bilirubin, UA moderate    Ketones, UA negative  Spec Grav, UA 1.020 1.010 - 1.025   Blood, UA negative    pH, UA 6.0 5.0 - 8.0   Protein, UA Positive (A) Negative   Urobilinogen, UA 1.0 0.2 or 1.0 E.U./dL   Nitrite, UA negative    Leukocytes, UA Negative Negative        Assessment & Plan:   Problem List Items Addressed This Visit   None Visit Diagnoses     Lower abdominal pain    -  Primary   Relevant Orders   POCT Urinalysis Dipstick (Automated) (Completed)   Urine Culture   CT ABDOMEN PELVIS W CONTRAST   CBC with Differential/Platelet   Comprehensive metabolic panel   Frequent urination       Relevant Orders   POCT Urinalysis Dipstick (Automated) (Completed)   Urine Culture     Urine with bili and protein - ordering blood work today. Will need to follow-up on the  urine. Given severity of lower abd pain, ordering STAT CT Abd/pelvis. Strict ED precautions discussed if pain worsens or new symptoms develop while we are waiting for workup and results. Patient verbalized understanding.   No orders of the defined types were placed in this encounter.   Return if symptoms worsen or fail to improve.  Clayborne Dana, NP

## 2023-03-07 ENCOUNTER — Ambulatory Visit: Payer: BC Managed Care – PPO | Admitting: Family Medicine

## 2023-03-07 LAB — COMPREHENSIVE METABOLIC PANEL
ALT: 78 U/L — ABNORMAL HIGH (ref 0–35)
AST: 60 U/L — ABNORMAL HIGH (ref 0–37)
Albumin: 4.4 g/dL (ref 3.5–5.2)
Alkaline Phosphatase: 92 U/L (ref 39–117)
BUN: 10 mg/dL (ref 6–23)
CO2: 29 mEq/L (ref 19–32)
Calcium: 9.3 mg/dL (ref 8.4–10.5)
Chloride: 99 mEq/L (ref 96–112)
Creatinine, Ser: 0.67 mg/dL (ref 0.40–1.20)
GFR: 108.84 mL/min (ref >60.00)
Glucose, Bld: 97 mg/dL (ref 70–99)
Potassium: 3.5 mEq/L (ref 3.5–5.1)
Sodium: 137 mEq/L (ref 135–145)
Total Bilirubin: 0.7 mg/dL (ref 0.2–1.2)
Total Protein: 7 g/dL (ref 6.0–8.3)

## 2023-03-07 LAB — CBC WITH DIFFERENTIAL/PLATELET
Basophils Absolute: 0.1 10*3/uL (ref 0.0–0.1)
Basophils Relative: 0.7 % (ref 0.0–3.0)
Eosinophils Absolute: 0.1 10*3/uL (ref 0.0–0.7)
Eosinophils Relative: 0.3 % (ref 0.0–5.0)
HCT: 41.8 % (ref 36.0–46.0)
Hemoglobin: 13.7 g/dL (ref 12.0–15.0)
Lymphocytes Relative: 16.2 % (ref 12.0–46.0)
Lymphs Abs: 2.6 10*3/uL (ref 0.7–4.0)
MCHC: 32.8 g/dL (ref 30.0–36.0)
MCV: 94 fl (ref 78.0–100.0)
Monocytes Absolute: 0.9 10*3/uL (ref 0.1–1.0)
Monocytes Relative: 5.7 % (ref 3.0–12.0)
Neutro Abs: 12.5 10*3/uL — ABNORMAL HIGH (ref 1.4–7.7)
Neutrophils Relative %: 77.1 % — ABNORMAL HIGH (ref 43.0–77.0)
Platelets: 257 10*3/uL (ref 150.0–400.0)
RBC: 4.45 Mil/uL (ref 3.87–5.11)
RDW: 13.6 % (ref 11.5–15.5)
WBC: 16.3 10*3/uL — ABNORMAL HIGH (ref 4.0–10.5)

## 2023-03-07 LAB — URINE CULTURE
MICRO NUMBER:: 15217704
SPECIMEN QUALITY:: ADEQUATE

## 2023-03-07 MED ORDER — METRONIDAZOLE 500 MG PO TABS
500.0000 mg | ORAL_TABLET | Freq: Three times a day (TID) | ORAL | 0 refills | Status: AC
Start: 2023-03-07 — End: 2023-03-14

## 2023-03-07 MED ORDER — SULFAMETHOXAZOLE-TRIMETHOPRIM 800-160 MG PO TABS
1.0000 | ORAL_TABLET | Freq: Two times a day (BID) | ORAL | 0 refills | Status: AC
Start: 2023-03-07 — End: 2023-03-14

## 2023-03-07 NOTE — Addendum Note (Signed)
Addended by: Hyman Hopes B on: 03/07/2023 08:06 AM   Modules accepted: Orders

## 2023-05-22 ENCOUNTER — Other Ambulatory Visit: Payer: Self-pay | Admitting: Nurse Practitioner

## 2023-05-22 ENCOUNTER — Ambulatory Visit: Payer: BC Managed Care – PPO | Admitting: Nurse Practitioner

## 2023-05-22 VITALS — BP 134/78 | HR 76 | Temp 98.0°F | Ht <= 58 in | Wt 157.2 lb

## 2023-05-22 DIAGNOSIS — Z6832 Body mass index (BMI) 32.0-32.9, adult: Secondary | ICD-10-CM

## 2023-05-22 DIAGNOSIS — E876 Hypokalemia: Secondary | ICD-10-CM

## 2023-05-22 DIAGNOSIS — R748 Abnormal levels of other serum enzymes: Secondary | ICD-10-CM

## 2023-05-22 DIAGNOSIS — E785 Hyperlipidemia, unspecified: Secondary | ICD-10-CM

## 2023-05-22 DIAGNOSIS — I10 Essential (primary) hypertension: Secondary | ICD-10-CM | POA: Insufficient documentation

## 2023-05-22 DIAGNOSIS — F419 Anxiety disorder, unspecified: Secondary | ICD-10-CM

## 2023-05-22 DIAGNOSIS — E66811 Obesity, class 1: Secondary | ICD-10-CM

## 2023-05-22 LAB — COMPREHENSIVE METABOLIC PANEL
ALT: 19 U/L (ref 0–35)
AST: 19 U/L (ref 0–37)
Albumin: 4.5 g/dL (ref 3.5–5.2)
Alkaline Phosphatase: 56 U/L (ref 39–117)
BUN: 7 mg/dL (ref 6–23)
CO2: 26 meq/L (ref 19–32)
Calcium: 9.3 mg/dL (ref 8.4–10.5)
Chloride: 104 meq/L (ref 96–112)
Creatinine, Ser: 0.64 mg/dL (ref 0.40–1.20)
GFR: 109.89 mL/min (ref >60.00)
Glucose, Bld: 83 mg/dL (ref 70–99)
Potassium: 3.3 meq/L — ABNORMAL LOW (ref 3.5–5.1)
Sodium: 139 meq/L (ref 135–145)
Total Bilirubin: 0.5 mg/dL (ref 0.2–1.2)
Total Protein: 7.6 g/dL (ref 6.0–8.3)

## 2023-05-22 LAB — LIPID PANEL
Cholesterol: 208 mg/dL — ABNORMAL HIGH (ref 0–200)
HDL: 46.2 mg/dL (ref >39.00)
LDL Cholesterol: 139 mg/dL — ABNORMAL HIGH (ref 0–99)
NonHDL: 162.25
Total CHOL/HDL Ratio: 5
Triglycerides: 114 mg/dL (ref 0.0–149.0)
VLDL: 22.8 mg/dL (ref 0.0–40.0)

## 2023-05-22 LAB — HEMOGLOBIN A1C: Hgb A1c MFr Bld: 5.4 % (ref 4.6–6.5)

## 2023-05-22 MED ORDER — DIAZEPAM 2 MG PO TABS
ORAL_TABLET | ORAL | 1 refills | Status: DC
Start: 2023-05-22 — End: 2023-11-23

## 2023-05-22 MED ORDER — POTASSIUM CHLORIDE CRYS ER 20 MEQ PO TBCR
20.0000 meq | EXTENDED_RELEASE_TABLET | Freq: Every day | ORAL | 0 refills | Status: DC
Start: 2023-05-22 — End: 2023-11-23

## 2023-05-22 NOTE — Assessment & Plan Note (Signed)
Will prescribe Valium 2 mg tablet that she can take 30 to 60 minutes before procedure, may repeat once if needed.  [place] controlled substance database reviewed.  Patient also educated to not drive or operate heavy machinery after taking the medication.  She reports understanding.

## 2023-05-22 NOTE — Progress Notes (Signed)
Established Patient Office Visit  Subjective   Patient ID: Faith Evans, female    DOB: 1980-10-20  Age: 42 y.o. MRN: [id number]  Chief Complaint  Patient presents with   Hyperlipidemia   Hyperlipidemia/obesity/elevated liver enzyme: Due for labs rechecked today.  Liver enzymes elevated last time blood was drawn this was also during an acute episode of diverticulitis.  Not on cholesterol-lowering therapy at this time.  She is fasting today.  Procedural anxiety: Patient reports she has an upcoming dental procedure.  This gives her extreme anxiety.  Has had to use "laughing gas" for procedures in the past.  Reports this current dentist does not offer this medication for procedures and she would like to discuss prescription of something to be taken as needed for sedation.  Overall she is feeling well today.     ROS: see HPI    Objective:     BP 134/78   Pulse 76   Temp 98 F (36.7 C) (Temporal)   Ht 4\' 10"  (1.473 m)   Wt 157 lb 4 oz (71.3 kg)   SpO2 96%   BMI 32.87 kg/m  BP Readings from Last 3 Encounters:  05/22/23 134/78  03/06/23 (!) 153/100  01/22/23 119/78   Wt Readings from Last 3 Encounters:  05/22/23 157 lb 4 oz (71.3 kg)  03/06/23 163 lb (73.9 kg)  01/22/23 157 lb (71.2 kg)      Physical Exam Vitals reviewed.  Constitutional:      General: She is not in acute distress.    Appearance: Normal appearance.  HENT:     Head: Normocephalic and atraumatic.  Neck:     Vascular: No carotid bruit.  Cardiovascular:     Rate and Rhythm: Normal rate and regular rhythm.     Pulses: Normal pulses.     Heart sounds: Normal heart sounds.  Pulmonary:     Effort: Pulmonary effort is normal.     Breath sounds: Normal breath sounds.  Skin:    General: Skin is warm and dry.  Neurological:     General: No focal deficit present.     Mental Status: She is alert and oriented to person, place, and time.  Psychiatric:        Mood and Affect: Mood normal.         Behavior: Behavior normal.        Judgment: Judgment normal.      No results found for any visits on 05/22/23.    The ASCVD Risk score (Arnett DK, et al., 2019) failed to calculate for the following reasons:   Cannot find a previous HDL lab   Cannot find a previous total cholesterol lab    Assessment & Plan:   Problem List Items Addressed This Visit       Other   Hyperlipidemia    Chronic Check fasting lipid panel today, further recommendations may be made based upon his results      Relevant Orders   Comprehensive metabolic panel   Lipid panel   Elevated liver enzymes - Primary    Incidental finding on last labs Repeat CMP, further recommendations may be made based upon these results      Relevant Orders   Comprehensive metabolic panel   Lipid panel   Class 1 obesity without serious comorbidity with body mass index (BMI) of 32.0 to 32.9 in adult    Labs ordered, further recommendations may be made based upon these results  Relevant Orders   Hemoglobin A1c   Anxiety due to invasive procedure    Will prescribe Valium 2 mg tablet that she can take 30 to 60 minutes before procedure, may repeat once if needed.  [place] controlled substance database reviewed.  Patient also educated to not drive or operate heavy machinery after taking the medication.  She reports understanding.      Relevant Medications   diazepam (VALIUM) 2 MG tablet    Return in about 6 months (around 11/20/2023) for CPE with Maralyn Sago.    Elenore Paddy, NP

## 2023-05-22 NOTE — Assessment & Plan Note (Signed)
Chronic Check fasting lipid panel today, further recommendations may be made based upon his results

## 2023-05-22 NOTE — Assessment & Plan Note (Signed)
Labs ordered, further recommendations may be made based upon these results. 

## 2023-05-22 NOTE — Assessment & Plan Note (Signed)
Incidental finding on last labs Repeat CMP, further recommendations may be made based upon these results

## 2023-05-29 ENCOUNTER — Ambulatory Visit: Payer: BC Managed Care – PPO | Admitting: Nurse Practitioner

## 2023-10-06 ENCOUNTER — Encounter: Payer: Self-pay | Admitting: Emergency Medicine

## 2023-10-06 ENCOUNTER — Ambulatory Visit: Payer: BC Managed Care – PPO | Admitting: Emergency Medicine

## 2023-10-06 ENCOUNTER — Ambulatory Visit: Payer: Self-pay | Admitting: Nurse Practitioner

## 2023-10-06 ENCOUNTER — Telehealth: Payer: Self-pay

## 2023-10-06 VITALS — BP 128/88 | HR 86 | Temp 98.9°F | Ht <= 58 in | Wt 158.0 lb

## 2023-10-06 DIAGNOSIS — R1032 Left lower quadrant pain: Secondary | ICD-10-CM | POA: Insufficient documentation

## 2023-10-06 DIAGNOSIS — K5792 Diverticulitis of intestine, part unspecified, without perforation or abscess without bleeding: Secondary | ICD-10-CM | POA: Insufficient documentation

## 2023-10-06 LAB — COMPREHENSIVE METABOLIC PANEL
ALT: 97 U/L — ABNORMAL HIGH (ref 0–35)
AST: 66 U/L — ABNORMAL HIGH (ref 0–37)
Albumin: 4.2 g/dL (ref 3.5–5.2)
Alkaline Phosphatase: 100 U/L (ref 39–117)
BUN: 8 mg/dL (ref 6–23)
CO2: 28 meq/L (ref 19–32)
Calcium: 9 mg/dL (ref 8.4–10.5)
Chloride: 101 meq/L (ref 96–112)
Creatinine, Ser: 0.68 mg/dL (ref 0.40–1.20)
GFR: 108.01 mL/min (ref >60.00)
Glucose, Bld: 101 mg/dL — ABNORMAL HIGH (ref 70–99)
Potassium: 3.5 meq/L (ref 3.5–5.1)
Sodium: 138 meq/L (ref 135–145)
Total Bilirubin: 0.7 mg/dL (ref 0.2–1.2)
Total Protein: 7.5 g/dL (ref 6.0–8.3)

## 2023-10-06 LAB — URINALYSIS
Hgb urine dipstick: NEGATIVE
Ketones, ur: NEGATIVE
Leukocytes,Ua: NEGATIVE
Nitrite: NEGATIVE
Specific Gravity, Urine: >=1.03 — AB (ref 1.000–1.030)
Urine Glucose: NEGATIVE
Urobilinogen, UA: 1 (ref 0.0–1.0)
pH: 6 (ref 5.0–8.0)

## 2023-10-06 LAB — CBC WITH DIFFERENTIAL/PLATELET
Basophils Absolute: 0 10*3/uL (ref 0.0–0.1)
Basophils Relative: 0.2 % (ref 0.0–3.0)
Eosinophils Absolute: 0 10*3/uL (ref 0.0–0.7)
Eosinophils Relative: 0.1 % (ref 0.0–5.0)
HCT: 42.7 % (ref 36.0–46.0)
Hemoglobin: 14.2 g/dL (ref 12.0–15.0)
Lymphocytes Relative: 11.4 % — ABNORMAL LOW (ref 12.0–46.0)
Lymphs Abs: 2.5 10*3/uL (ref 0.7–4.0)
MCHC: 33.3 g/dL (ref 30.0–36.0)
MCV: 93.4 fL (ref 78.0–100.0)
Monocytes Absolute: 1.3 10*3/uL — ABNORMAL HIGH (ref 0.1–1.0)
Monocytes Relative: 6.1 % (ref 3.0–12.0)
Neutro Abs: 17.7 10*3/uL — ABNORMAL HIGH (ref 1.4–7.7)
Neutrophils Relative %: 82.2 % — ABNORMAL HIGH (ref 43.0–77.0)
Platelets: 260 10*3/uL (ref 150.0–400.0)
RBC: 4.57 Mil/uL (ref 3.87–5.11)
RDW: 14.3 % (ref 11.5–15.5)
WBC: 21.5 10*3/uL (ref 4.0–10.5)

## 2023-10-06 MED ORDER — CIPROFLOXACIN HCL 500 MG PO TABS
500.0000 mg | ORAL_TABLET | Freq: Two times a day (BID) | ORAL | 0 refills | Status: DC
Start: 2023-10-06 — End: 2023-10-13

## 2023-10-06 MED ORDER — METRONIDAZOLE 500 MG PO TABS
500.0000 mg | ORAL_TABLET | Freq: Two times a day (BID) | ORAL | 0 refills | Status: DC
Start: 2023-10-06 — End: 2023-10-13

## 2023-10-06 NOTE — Telephone Encounter (Signed)
Chart reviewed, ok to follow for now

## 2023-10-06 NOTE — Telephone Encounter (Signed)
Chief Complaint: abdominal pain Symptoms: left lower abdominal pain, heartburn/acid feeling in my throat, nausea, left lower back pain, sweating Frequency: since Friday Pertinent Negatives: Patient denies vomiting, painful urination, urinary frequency, blood in urine, diarrhea, fever Disposition: [] ED /[] Urgent Care (no appt availability in office) / [x] Appointment(In office/virtual)/ []  [facility]/ [] Home Care/ [] Refused Recommended Disposition /[] [facility]/ []  Follow-up with PCP Additional Notes: Patient states she has been taking Tylenol and Ibuprofen for pain. Patient states her last bowel movement was Saturday and states that is unusual for her, usually goes 2-4 times daily since having her gallbladder removed. Patient states this feels similar to when she had diverticulitis. Patient also reports having light spotting/period last week which is rare due to having an IUD.  Copied from CRM [id number]. Topic: Clinical - Red Word Triage >> Oct 06, 2023  7:57 AM Marica Otter wrote: Red Word that prompted transfer to Nurse Triage: Severe abdominal pain since Friday. Reason for Disposition  [1] MILD-MODERATE pain AND [2] constant AND [3] present > 2 hours  Answer Assessment - Initial Assessment Questions 1. LOCATION: "Where does it hurt?"      Left lower abdominal pain.  2. RADIATION: "Does the pain shoot anywhere else?" (e.g., chest, back)     Left lower back.  3. ONSET: "When did the pain begin?" (e.g., minutes, hours or days ago)      Friday.  4. SUDDEN: "Gradual or sudden onset?"     "It started hurting pretty bad on Friday, I wouldn't say it was gradual".  5. PATTERN "Does the pain come and go, or is it constant?"    - If it comes and goes: "How long does it last?" "Do you have pain now?"     (Note: Comes and goes means the pain is intermittent. It goes away completely between bouts.)    - If constant: "Is it getting better, staying the same, or getting worse?"       (Note: Constant means the pain never goes away completely; most serious pain is constant and gets worse.)      Comes and goes, she states eventually the OTC pain meds do work. Pain worsens with movement. Pain lasts hours she states when it wakes her up.  6. SEVERITY: "How bad is the pain?"  (e.g., Scale 1-10; mild, moderate, or severe)    - MILD (1-3): Doesn't interfere with normal activities, abdomen soft and not tender to touch.     - MODERATE (4-7): Interferes with normal activities or awakens from sleep, abdomen tender to touch.     - SEVERE (8-10): Excruciating pain, doubled over, unable to do any normal activities.       7/10; took OTC pain meds around 0500 this morning. Pain wakes her up from her sleep.  7. RECURRENT SYMPTOM: "Have you ever had this type of stomach pain before?" If Yes, ask: "When was the last time?" and "What happened that time?"      Yes, states she went to the doctor and was seen in [facility] by Dr Reola Calkins. She states she had a CT and was told it was diverticulitis back in July.  8. CAUSE: "What do you think is causing the stomach pain?"     She states it feels like it could be the diverticulitis.  9. RELIEVING/AGGRAVATING FACTORS: "What makes it better or worse?" (e.g., antacids, bending or twisting motion, bowel movement)     Improves with Tylenol and Ibuprofen, and lying/sitting still and worse  with movements.  10. OTHER SYMPTOMS: "Do you have any other symptoms?" (e.g., back pain, diarrhea, fever, urination pain, vomiting)       Lower left back pain, constipation  11. PREGNANCY: "Is there any chance you are pregnant?" "When was your last menstrual period?"       Patient states she has an IUD, last Sunday started having vaginal bleeding thru Thursday  (light spotting).  Protocols used: Abdominal Pain - Female-A-AH

## 2023-10-06 NOTE — Assessment & Plan Note (Signed)
Has history of diverticulosis Had episode of diverticulitis last summer History of cholecystectomy Tender left lower quadrant abdomen Afebrile.  Able to eat and drink. Allergic to amoxicillin Recommend to start Cipro and Flagyl 500 mg twice a day for 7 days Diet and nutrition discussed ED precautions given Advised to contact the office if no better or worse during the next several days

## 2023-10-06 NOTE — Patient Instructions (Signed)
 Diverticulitis  Diverticulitis is when small pouches in your colon get infected or swollen. This causes pain in your belly (abdomen) and watery poop (diarrhea). The small pouches are called diverticula. They may form if you have a condition called diverticulosis. What are the causes? You may get this condition if poop (stool) gets trapped in the pouches in your colon. The poop lets germs (bacteria) grow. This causes an infection. What increases the risk? You are more likely to get this condition if you have small pouches in your colon. You are also more likely to get it if: You are overweight or very overweight (obese). You do not exercise enough. You drink alcohol. You smoke. You eat a lot of red meat, like beef, pork, or lamb. You do not eat enough fiber. You are older than 42 years of age. What are the signs or symptoms? Pain in your belly. Pain is often on the left side, but it may be felt in other spots too. Fever and chills. Feeling like you may vomit. Vomiting. Having cramps. Feeling full. Changes in how often you poop. Blood in your poop. How is this treated? Most cases are treated at home. You may be told to: Take over-the-counter pain medicines. Only eat and drink clear liquids. Take antibiotics. Rest. Very bad cases may need to be treated at a hospital. Treatment may include: Not eating or drinking. Taking pain medicines. Getting antibiotics through an IV tube. Getting fluid and food through an IV tube. Having surgery. When you are feeling better, you may need to have a test to look at your colon (colonoscopy). Follow these instructions at home: Medicines Take over-the-counter and prescription medicines only as told by your doctor. These include: Fiber pills. Probiotics. Medicines to make your poop soft (stool softeners). If you were prescribed antibiotics, take them as told by your doctor. Do not stop taking them even if you start to feel better. Ask your  doctor if you should avoid driving or using machines while you are taking your medicine. Eating and drinking  Follow the diet told by your doctor. You may need to only eat and drink liquids. When you feel better, you may be able to eat more foods. You may also be told to eat a lot of fiber. Fiber helps you poop. Foods with fiber include berries, beans, lentils, and green vegetables. Try not to eat red meat. General instructions Do not smoke or use any products that contain nicotine or tobacco. If you need help quitting, ask your doctor. Exercise 3 or more times a week. Try to go for 30 minutes each time. Exercise enough to sweat and make your heart beat faster. Contact a doctor if: Your pain gets worse. You are not pooping like normal. Your symptoms do not get better. Your symptoms get worse very fast. You have a fever. You vomit more than one time. You have poop that is: Bloody. Black. Tarry. This information is not intended to replace advice given to you by your health care provider. Make sure you discuss any questions you have with your health care provider. Document Revised: 05/02/2022 Document Reviewed: 05/02/2022 Elsevier Patient Education  2024 [employer].

## 2023-10-06 NOTE — Assessment & Plan Note (Signed)
Secondary to acute diverticulitis Diet and nutrition discussed Pain management discussed Advised to take Tylenol and avoid NSAIDs Avoid opiate narcotics

## 2023-10-06 NOTE — Telephone Encounter (Signed)
CRITICAL VALUE STICKER  CRITICAL VALUE: WBC at 21.5  RECEIVER (on-site recipient of call): Delorise Shiner  DATE & TIME NOTIFIED: 10/06/23 @ 12:39 pm  MESSENGER (representative from lab): [email or web address]   MD NOTIFIED: Yes PCP of the day, mychart   TIME OF NOTIFICATION: 12:42

## 2023-10-06 NOTE — Progress Notes (Signed)
Faith Evans 43 y.o.   Chief Complaint  Patient presents with   Abdominal Pain    Patient states the pain started on Friday around 3-4pm. She is having nausea/ constipation. She states she had the feeling before back in July  and they told her it was diverticulitis. Did mention she had her Gallbladder removed 01/2023. She is taking tylenol for the pain. Does state her back is starting to hurt, she hasn't been eating much mostly water and fluid     HISTORY OF PRESENT ILLNESS: Acute problem visit today. This is a 43 y.o. female complaining of left lower abdominal pain since last Friday afternoon, 3 days ago. Steady pain without any other associated symptoms.  Was able to have regular bowel movement on Saturday.  None yesterday or today Able to eat and drink.  Denies nausea or vomiting.  Denies fever or chills. Colonoscopy report January 2024 showed 2 polyps and diverticulosis Last summer had similar episode and was seen by nurse practitioner who ordered CT scan of abdomen CT scan report shows acute diverticulitis.  Was not treated with antibiotics.  Got better on her own. No other complaints or medical concerns today.  Abdominal Pain Pertinent negatives include no constipation, diarrhea, dysuria, fever, headaches, hematuria, melena, nausea or vomiting.     Prior to Admission medications   Medication Sig Start Date End Date Taking? Authorizing Provider  diazepam (VALIUM) 2 MG tablet Take 1 tablet by mouth 60-90 minutes before procedure, if anxiety is not improved repeat dose in 60 minutes. Do not drive or operate heavy machinery after taking medication. Patient not taking: Reported on 10/06/2023 05/22/23   Elenore Paddy, NP  JUNEL FE 1/20 1-20 MG-MCG tablet Take 1 tablet by mouth daily. Patient not taking: Reported on 10/06/2023 05/15/23   [provider]  potassium chloride SA (KLOR-CON M) 20 MEQ tablet Take 1 tablet (20 mEq total) by mouth daily. Patient not taking: Reported on  10/06/2023 05/22/23   Elenore Paddy, NP  triamcinolone cream (KENALOG) 0.1 % Apply 1 application. topically 2 (two) times daily. To affected area till better Patient not taking: Reported on 10/06/2023 11/21/21   Zenia Resides, MD    Allergies  Allergen Reactions   Amoxicillin Rash   Cinnamon Rash    Patient Active Problem List   Diagnosis Date Noted   Elevated liver enzymes 05/22/2023   Class 1 obesity without serious comorbidity with body mass index (BMI) of 32.0 to 32.9 in adult 05/22/2023   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   Encounter for general adult medical examination with abnormal findings 08/01/2022   Early satiety 05/30/2022   Class 2 obesity with body mass index (BMI) of 35.0 to 35.9 in adult 04/26/2022   Vitamin D deficiency 04/26/2022   Elevated blood pressure reading 04/26/2022   Rash 04/26/2022   Hyperlipidemia 04/26/2022    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   Fatty liver    GERD (gastroesophageal reflux disease)    History of hemolysis, elevated liver enzymes, and low platelet (HELLP) syndrome 2007   had pre-eclampsia and Toxemia   Hypertension    Pneumonia     Past Surgical History:  Procedure Laterality Date   CESAREAN SECTION  2007   emergent d/t HELLP, Toxemia   CHOLECYSTECTOMY N/A 01/22/2023  Procedure: LAPAROSCOPIC CHOLECYSTECTOMY WITH ICG DYE;  Surgeon: Berna Bue, MD;  Location: [facility];  Service: General;  Laterality: N/A;   NO PAST SURGERIES     WISDOM TOOTH EXTRACTION      Social History   Socioeconomic History   Marital status: Married    Spouse name: Not on file   Number of children: Not on file   Years of education: Not on file   Highest education level: 12th grade  Occupational History   Not on file  Tobacco Use   Smoking status: Never   Smokeless tobacco: Never   Vaping Use   Vaping status: Never Used  Substance and Sexual Activity   Alcohol use: Yes    Comment: occasionally   Drug use: Never   Sexual activity: Yes  Other Topics Concern   Not on file  Social History Narrative   Not on file   Social Drivers of Health   Financial Resource Strain: Low Risk  (03/06/2023)   Overall Financial Resource Strain (CARDIA)    Difficulty of Paying Living Expenses: Not hard at all  Food Insecurity: No Food Insecurity (03/06/2023)   Hunger Vital Sign    Worried About Running Out of Food in the Last Year: Never true    Ran Out of Food in the Last Year: Never true  Transportation Needs: No Transportation Needs (03/06/2023)   PRAPARE - Administrator, Civil Service (Medical): No    Lack of Transportation (Non-Medical): No  Physical Activity: Insufficiently Active (03/06/2023)   Exercise Vital Sign    Days of Exercise per Week: 3 days    Minutes of Exercise per Session: 10 min  Stress: No Stress Concern Present (03/06/2023)   [employer] of Occupational Health - Occupational Stress Questionnaire    Feeling of Stress : Not at all  Social Connections: Moderately Isolated (03/06/2023)   Social Connection and Isolation Panel [NHANES]    Frequency of Communication with Friends and Family: More than three times a week    Frequency of Social Gatherings with Friends and Family: Once a week    Attends Religious Services: Never    Database administrator or Organizations: No    Attends Engineer, structural: Not on file    Marital Status: Married  Catering manager Violence: Not on file    Family History  Problem Relation Age of Onset   Cancer Mother    Seizures Father    Cancer Maternal Grandfather    Colon cancer Neg Hx    Rectal cancer Neg Hx    Stomach cancer Neg Hx    Esophageal cancer Neg Hx      Review of Systems  Constitutional: Negative.  Negative for chills and fever.  HENT: Negative.  Negative for congestion and sore  throat.   Respiratory: Negative.  Negative for cough and shortness of breath.   Cardiovascular: Negative.  Negative for chest pain and palpitations.  Gastrointestinal:  Positive for abdominal pain. Negative for blood in stool, constipation, diarrhea, melena, nausea and vomiting.  Genitourinary: Negative.  Negative for dysuria and hematuria.  Skin: Negative.  Negative for rash.  Neurological: Negative.  Negative for dizziness and headaches.  All other systems reviewed and are negative.   Vitals:   10/06/23 0931  BP: 128/88  Pulse: 86  Temp: 98.9 F (37.2 C)  SpO2: 95%    Physical Exam Vitals reviewed.  Constitutional:      Appearance: She is well-developed.  HENT:  Head: Normocephalic.     Mouth/Throat:     Mouth: Mucous membranes are moist.     Pharynx: Oropharynx is clear.  Eyes:     Extraocular Movements: Extraocular movements intact.     Pupils: Pupils are equal, round, and reactive to light.  Cardiovascular:     Rate and Rhythm: Normal rate and regular rhythm.     Pulses: Normal pulses.     Heart sounds: Normal heart sounds.  Pulmonary:     Effort: Pulmonary effort is normal.     Breath sounds: Normal breath sounds.  Abdominal:     Palpations: Abdomen is soft.     Tenderness: There is abdominal tenderness (Left lower quadrant tenderness). There is no right CVA tenderness or left CVA tenderness.  Musculoskeletal:     Cervical back: No tenderness.  Lymphadenopathy:     Cervical: No cervical adenopathy.  Skin:    General: Skin is warm and dry.     Capillary Refill: Capillary refill takes less than 2 seconds.  Neurological:     General: No focal deficit present.     Mental Status: She is alert and oriented to person, place, and time.      ASSESSMENT & PLAN: A total of 42 minutes was spent with the patient and counseling/coordination of care regarding preparing for this visit, review of most recent office visit notes, review of chronic medical conditions  under management, review of colonoscopy report from January 2024, review of CT scan abdomen pelvis from July 2024, review of all medications, diagnosis of acute diverticulitis and need for antibiotics, need for blood work today, education on nutrition, prognosis, ED precautions, documentation and need for follow-up if no better or worse during the next several days  Problem List Items Addressed This Visit       Digestive   Acute diverticulitis - Primary   Has history of diverticulosis Had episode of diverticulitis last summer History of cholecystectomy Tender left lower quadrant abdomen Afebrile.  Able to eat and drink. Allergic to amoxicillin Recommend to start Cipro and Flagyl 500 mg twice a day for 7 days Diet and nutrition discussed ED precautions given Advised to contact the office if no better or worse during the next several days      Relevant Medications   ciprofloxacin (CIPRO) 500 MG tablet   metroNIDAZOLE (FLAGYL) 500 MG tablet   Other Relevant Orders   Comprehensive metabolic panel   CBC with Differential/Platelet   Urinalysis     Other   Left lower quadrant abdominal pain   Secondary to acute diverticulitis Diet and nutrition discussed Pain management discussed Advised to take Tylenol and avoid NSAIDs Avoid opiate narcotics      Patient Instructions  Diverticulitis  Diverticulitis is when small pouches in your colon get infected or swollen. This causes pain in your belly (abdomen) and watery poop (diarrhea). The small pouches are called diverticula. They may form if you have a condition called diverticulosis. What are the causes? You may get this condition if poop (stool) gets trapped in the pouches in your colon. The poop lets germs (bacteria) grow. This causes an infection. What increases the risk? You are more likely to get this condition if you have small pouches in your colon. You are also more likely to get it if: You are overweight or very overweight  (obese). You do not exercise enough. You drink alcohol. You smoke. You eat a lot of red meat, like beef, pork, or lamb. You do not eat  enough fiber. You are older than 42 years of age. What are the signs or symptoms? Pain in your belly. Pain is often on the left side, but it may be felt in other spots too. Fever and chills. Feeling like you may vomit. Vomiting. Having cramps. Feeling full. Changes in how often you poop. Blood in your poop. How is this treated? Most cases are treated at home. You may be told to: Take over-the-counter pain medicines. Only eat and drink clear liquids. Take antibiotics. Rest. Very bad cases may need to be treated at a hospital. Treatment may include: Not eating or drinking. Taking pain medicines. Getting antibiotics through an IV tube. Getting fluid and food through an IV tube. Having surgery. When you are feeling better, you may need to have a test to look at your colon (colonoscopy). Follow these instructions at home: Medicines Take over-the-counter and prescription medicines only as told by your doctor. These include: Fiber pills. Probiotics. Medicines to make your poop soft (stool softeners). If you were prescribed antibiotics, take them as told by your doctor. Do not stop taking them even if you start to feel better. Ask your doctor if you should avoid driving or using machines while you are taking your medicine. Eating and drinking  Follow the diet told by your doctor. You may need to only eat and drink liquids. When you feel better, you may be able to eat more foods. You may also be told to eat a lot of fiber. Fiber helps you poop. Foods with fiber include berries, beans, lentils, and green vegetables. Try not to eat red meat. General instructions Do not smoke or use any products that contain nicotine or tobacco. If you need help quitting, ask your doctor. Exercise 3 or more times a week. Try to go for 30 minutes each time. Exercise  enough to sweat and make your heart beat faster. Contact a doctor if: Your pain gets worse. You are not pooping like normal. Your symptoms do not get better. Your symptoms get worse very fast. You have a fever. You vomit more than one time. You have poop that is: Bloody. Black. Tarry. This information is not intended to replace advice given to you by your health care provider. Make sure you discuss any questions you have with your health care provider. Document Revised: 05/02/2022 Document Reviewed: 05/02/2022 Elsevier Patient Education  2024 Elsevier Inc[employer]    Edwina Barth, MD [facility]

## 2023-10-08 NOTE — Telephone Encounter (Signed)
I was able to get pt scheduled for Friday 10/09/2022 @ 8:00 am.  Pt states she is feeling somewhat better and not as bad as she was.

## 2023-10-10 ENCOUNTER — Encounter: Payer: Self-pay | Admitting: Nurse Practitioner

## 2023-10-10 ENCOUNTER — Ambulatory Visit: Payer: BC Managed Care – PPO | Admitting: Nurse Practitioner

## 2023-10-10 DIAGNOSIS — K5792 Diverticulitis of intestine, part unspecified, without perforation or abscess without bleeding: Secondary | ICD-10-CM | POA: Diagnosis not present

## 2023-10-10 DIAGNOSIS — E876 Hypokalemia: Secondary | ICD-10-CM | POA: Diagnosis not present

## 2023-10-10 LAB — CBC WITH DIFFERENTIAL/PLATELET
Basophils Absolute: 0.1 10*3/uL (ref 0.0–0.1)
Basophils Relative: 0.6 % (ref 0.0–3.0)
Eosinophils Absolute: 0.1 10*3/uL (ref 0.0–0.7)
Eosinophils Relative: 0.4 % (ref 0.0–5.0)
HCT: 43.3 % (ref 36.0–46.0)
Hemoglobin: 14.7 g/dL (ref 12.0–15.0)
Lymphocytes Relative: 23 % (ref 12.0–46.0)
Lymphs Abs: 2.7 10*3/uL (ref 0.7–4.0)
MCHC: 33.9 g/dL (ref 30.0–36.0)
MCV: 93.3 fL (ref 78.0–100.0)
Monocytes Absolute: 0.9 10*3/uL (ref 0.1–1.0)
Monocytes Relative: 7.5 % (ref 3.0–12.0)
Neutro Abs: 8.2 10*3/uL — ABNORMAL HIGH (ref 1.4–7.7)
Neutrophils Relative %: 68.5 % (ref 43.0–77.0)
Platelets: 383 10*3/uL (ref 150.0–400.0)
RBC: 4.64 Mil/uL (ref 3.87–5.11)
RDW: 14.1 % (ref 11.5–15.5)
WBC: 12 10*3/uL — ABNORMAL HIGH (ref 4.0–10.5)

## 2023-10-10 LAB — BASIC METABOLIC PANEL
BUN: 7 mg/dL (ref 6–23)
CO2: 29 meq/L (ref 19–32)
Calcium: 8.9 mg/dL (ref 8.4–10.5)
Chloride: 101 meq/L (ref 96–112)
Creatinine, Ser: 0.69 mg/dL (ref 0.40–1.20)
GFR: 107.62 mL/min (ref >60.00)
Glucose, Bld: 97 mg/dL (ref 70–99)
Potassium: 3.5 meq/L (ref 3.5–5.1)
Sodium: 140 meq/L (ref 135–145)

## 2023-10-10 LAB — MAGNESIUM: Magnesium: 2.1 mg/dL (ref 1.5–2.5)

## 2023-10-10 MED ORDER — METRONIDAZOLE 500 MG PO TABS: 500.0000 mg | ORAL_TABLET | Freq: Two times a day (BID) | ORAL | 0 refills | Status: AC

## 2023-10-10 MED ORDER — CIPROFLOXACIN HCL 500 MG PO TABS
500.0000 mg | ORAL_TABLET | Freq: Two times a day (BID) | ORAL | 0 refills | Status: AC
Start: 2023-10-10 — End: 2023-10-15

## 2023-10-10 NOTE — Assessment & Plan Note (Addendum)
Acute Vital signs stable, patient continues to be able to eat and drink. Symptoms overall improving, however still had some significant tenderness on exam.  Will prescribe additional 5 days of antibiotics. Will repeat CBC, also discussed possibly checking CT scan but because patient is improving vital signs stable will hold off on imaging. Continue soft diet. Strict ER precautions discussed, patient reports understanding.

## 2023-10-10 NOTE — Progress Notes (Signed)
Established Patient Office Visit  Subjective   Patient ID: Faith Evans, female    DOB: Jul 02, 1981  Age: 43 y.o. MRN: [id number]  Chief Complaint  Patient presents with   Medical Management of Chronic Issues    Follow up on labs    Estimated Creatinine Clearance: 77.6 mL/min (by C-G formula based on SCr of 0.68 mg/dL).   Abdominal pain/acute diverticulitis: Patient is here for follow-up.  Was seen by colleague about 4 days ago, started on metronidazole and ciprofloxacin.  She is tolerating the medications well.  She is following a soft diet.  She reports that overall she is much improved but still has some lower abdominal pain.  She reports 1 episode of vomiting since she was seen last, feels this is related to not tolerating an olive.  Denies recurrent nausea, vomiting, worsening abdominal pain, fever, blood in stool, blood in emesis.  Last labs identified white blood cell count of 21.6.    ROS: see HPI    Objective:     BP 136/84   Pulse 98   Temp 97.7 F (36.5 C) (Temporal)   Ht 4\' 10"  (1.473 m)   Wt 157 lb 8 oz (71.4 kg)   SpO2 93%   BMI 32.92 kg/m    Physical Exam Vitals reviewed.  Constitutional:      General: She is not in acute distress.    Appearance: Normal appearance.  HENT:     Head: Normocephalic and atraumatic.  Neck:     Vascular: No carotid bruit.  Cardiovascular:     Rate and Rhythm: Normal rate and regular rhythm.     Pulses: Normal pulses.     Heart sounds: Normal heart sounds.  Pulmonary:     Effort: Pulmonary effort is normal.     Breath sounds: Normal breath sounds.  Abdominal:     General: Abdomen is flat. Bowel sounds are decreased. There is no distension.     Palpations: Abdomen is soft.     Tenderness: There is abdominal tenderness in the right lower quadrant and left lower quadrant.  Skin:    General: Skin is warm and dry.  Neurological:     General: No focal deficit present.     Mental Status: She is alert and oriented to  person, place, and time.  Psychiatric:        Mood and Affect: Mood normal.        Behavior: Behavior normal.        Judgment: Judgment normal.      No results found for any visits on 10/10/23.    The 10-year ASCVD risk score (Arnett DK, et al., 2019) is: 1%    Assessment & Plan:   Problem List Items Addressed This Visit       Digestive   Acute diverticulitis   Acute Vital signs stable, patient continues to be able to eat and drink. Symptoms overall improving, however still had some significant tenderness on exam.  Will prescribe additional 5 days of antibiotics. Will repeat CBC, also discussed possibly checking CT scan but because patient is improving vital signs stable will hold off on imaging. Continue soft diet. Strict ER precautions discussed, patient reports understanding.      Relevant Medications   ciprofloxacin (CIPRO) 500 MG tablet   metroNIDAZOLE (FLAGYL) 500 MG tablet   Other Relevant Orders   CBC with Differential/Platelet   Other Visit Diagnoses       Hypokalemia  Return for as scheduled.    Elenore Paddy, NP

## 2023-11-17 ENCOUNTER — Ambulatory Visit: Payer: Self-pay

## 2023-11-17 NOTE — Telephone Encounter (Signed)
 Chief Complaint: Left lower abdominal pain, nausea Symptoms: see above Frequency: since yesterday Pertinent Negatives: Patient denies vomiting, blood in stool, constipation Disposition: [] ED /[] Urgent Care (no appt availability in office) / [x] Appointment(In office/virtual)/ []  [facility]/ [] Home Care/ [x] Refused Recommended Disposition /[] [facility]/ [x]  Follow-up with PCP Additional Notes: Patient called and stated she has been experiencing left lower abdominal pain and nausea since yesterday. Patient compares it to same exact feeling she had in February when she came in and was diagnosed with Diverticulitis. Patient is not experiencing constipation at this time. Patient denies blood in stool or vomiting. Patient is asking if lab orders can be sent over today to get blood work to determine if she needs another round of Abx or treatment. This RN spoke with Delice Bison in the office and stated would send a HP message to clinical pool to be routed to PCP for further approval or direction. Patient would like call back at main # ending in 61.    Copied from CRM [id number]. Topic: Clinical - Red Word Triage >> Nov 17, 2023  9:18 AM Fonda Kinder J wrote: Red Word that prompted transfer to Nurse Triage: left lower abdominal pain, nausea, constipation: Pt was seen on 02/17 for these symptoms and they have returned Reason for Disposition  [1] MILD-MODERATE pain AND [2] constant AND [3] present > 2 hours    Patient recent diagnosis of diverticulitis - see notes  Answer Assessment - Initial Assessment Questions 1. LOCATION: "Where does it hurt?"      Left lower abdominal pain 2. RADIATION: "Does the pain shoot anywhere else?" (e.g., chest, back)     Radiating to back  3. ONSET: "When did the pain begin?" (e.g., minutes, hours or days ago)      Yesterday morning 4. SUDDEN: "Gradual or sudden onset?"     Gradual  5. PATTERN "Does the pain come and go, or is it constant?"    - If it comes  and goes: "How long does it last?" "Do you have pain now?"     (Note: Comes and goes means the pain is intermittent. It goes away completely between bouts.)    - If constant: "Is it getting better, staying the same, or getting worse?"      (Note: Constant means the pain never goes away completely; most serious pain is constant and gets worse.)      Constant  6. SEVERITY: "How bad is the pain?"  (e.g., Scale 1-10; mild, moderate, or severe)    - MILD (1-3): Doesn't interfere with normal activities, abdomen soft and not tender to touch.     - MODERATE (4-7): Interferes with normal activities or awakens from sleep, abdomen tender to touch.     - SEVERE (8-10): Excruciating pain, doubled over, unable to do any normal activities.       4 but without medicine it heightens 7. RECURRENT SYMPTOM: "Have you ever had this type of stomach pain before?" If Yes, ask: "When was the last time?" and "What happened that time?"      Yes back in February with Acute Diverticulitis  8. CAUSE: "What do you think is causing the stomach pain?"     Diverticulitis  9. RELIEVING/AGGRAVATING FACTORS: "What makes it better or worse?" (e.g., antacids, bending or twisting motion, bowel movement)     Ibuprofen has helped a little 10. OTHER SYMPTOMS: "Do you have any other symptoms?" (e.g., back pain, diarrhea, fever, urination pain, vomiting)  nausea  Protocols used: Abdominal Pain - Female-A-AH

## 2023-11-19 ENCOUNTER — Other Ambulatory Visit: Payer: Self-pay | Admitting: Nurse Practitioner

## 2023-11-19 ENCOUNTER — Ambulatory Visit: Admitting: Nurse Practitioner

## 2023-11-19 ENCOUNTER — Telehealth: Payer: Self-pay

## 2023-11-19 VITALS — BP 134/86 | HR 83 | Temp 98.1°F | Ht <= 58 in | Wt 153.0 lb

## 2023-11-19 DIAGNOSIS — R319 Hematuria, unspecified: Secondary | ICD-10-CM | POA: Insufficient documentation

## 2023-11-19 DIAGNOSIS — K5792 Diverticulitis of intestine, part unspecified, without perforation or abscess without bleeding: Secondary | ICD-10-CM | POA: Diagnosis not present

## 2023-11-19 DIAGNOSIS — R109 Unspecified abdominal pain: Secondary | ICD-10-CM | POA: Insufficient documentation

## 2023-11-19 DIAGNOSIS — R1030 Lower abdominal pain, unspecified: Secondary | ICD-10-CM

## 2023-11-19 DIAGNOSIS — D72829 Elevated white blood cell count, unspecified: Secondary | ICD-10-CM

## 2023-11-19 LAB — CBC WITH DIFFERENTIAL/PLATELET
Basophils Absolute: 0.1 10*3/uL (ref 0.0–0.1)
Basophils Relative: 0.3 % (ref 0.0–3.0)
Eosinophils Absolute: 0 10*3/uL (ref 0.0–0.7)
Eosinophils Relative: 0 % (ref 0.0–5.0)
HCT: 39.9 % (ref 36.0–46.0)
Hemoglobin: 13.4 g/dL (ref 12.0–15.0)
Lymphocytes Relative: 10.7 % — ABNORMAL LOW (ref 12.0–46.0)
Lymphs Abs: 2.6 10*3/uL (ref 0.7–4.0)
MCHC: 33.5 g/dL (ref 30.0–36.0)
MCV: 93.2 fl (ref 78.0–100.0)
Monocytes Absolute: 1.5 10*3/uL — ABNORMAL HIGH (ref 0.1–1.0)
Monocytes Relative: 6.4 % (ref 3.0–12.0)
Neutro Abs: 19.8 10*3/uL — ABNORMAL HIGH (ref 1.4–7.7)
Neutrophils Relative %: 82.6 % — ABNORMAL HIGH (ref 43.0–77.0)
Platelets: 278 10*3/uL (ref 150.0–400.0)
RBC: 4.28 Mil/uL (ref 3.87–5.11)
RDW: 14.1 % (ref 11.5–15.5)
WBC: 24 10*3/uL (ref 4.0–10.5)

## 2023-11-19 LAB — BASIC METABOLIC PANEL WITH GFR
BUN: 13 mg/dL (ref 6–23)
CO2: 31 meq/L (ref 19–32)
Calcium: 9.3 mg/dL (ref 8.4–10.5)
Chloride: 97 meq/L (ref 96–112)
Creatinine, Ser: 0.6 mg/dL (ref 0.40–1.20)
GFR: 111.22 mL/min (ref >60.00)
Glucose, Bld: 105 mg/dL — ABNORMAL HIGH (ref 70–99)
Potassium: 3.2 meq/L — ABNORMAL LOW (ref 3.5–5.1)
Sodium: 135 meq/L (ref 135–145)

## 2023-11-19 LAB — MICROALBUMIN / CREATININE URINE RATIO
Creatinine,U: 203.2 mg/dL
Microalb Creat Ratio: 37.3 mg/g — ABNORMAL HIGH (ref 0.0–30.0)
Microalb, Ur: 7.6 mg/dL — ABNORMAL HIGH (ref 0.0–1.9)

## 2023-11-19 MED ORDER — HYDROCODONE-ACETAMINOPHEN 7.5-325 MG PO TABS
1.0000 | ORAL_TABLET | Freq: Four times a day (QID) | ORAL | 0 refills | Status: DC | PRN
Start: 2023-11-19 — End: 2023-12-29

## 2023-11-19 MED ORDER — METRONIDAZOLE 500 MG PO TABS: 500.0000 mg | ORAL_TABLET | Freq: Two times a day (BID) | ORAL | 0 refills | Status: DC

## 2023-11-19 MED ORDER — CIPROFLOXACIN HCL 500 MG PO TABS: 500.0000 mg | ORAL_TABLET | Freq: Two times a day (BID) | ORAL | 0 refills | Status: DC

## 2023-11-19 NOTE — Assessment & Plan Note (Signed)
 Acute Presumptive cause of pain at this time. Treat with course of ciprofloxacin and metronidazole. Due to severity of pain will treat with hydrocodone with Tylenol 7.5-325mg  every 6 hours as needed.  Patient cautioned to avoid over 3000 mg of Tylenol in 24-hour period from all sources.  She reports understanding. Patient told if symptoms worsen in any way to proceed to the emergency department.  She reports her understanding.

## 2023-11-19 NOTE — Assessment & Plan Note (Signed)
 Additional workup today ordered including transvaginal ultrasound of abdomen and pelvis as well as urine testing for possible UTI.  Further recommendations may be made based upon the results.

## 2023-11-19 NOTE — Progress Notes (Unsigned)
 Established Patient Office Visit  Subjective   Patient ID: Faith Evans, female    DOB: October 27, 1980  Age: 43 y.o. MRN: [id number]  Chief Complaint  Patient presents with   Abdominal Pain    Symptom onset 2-3 days ago.  Reports it feels similar to last time she had acute diverticulitis.  At that time she was treated with metronidazole and ciprofloxacin and ultimately recovered.  She also mention she has noticed bleeding with urination with this flareup and the last time she experienced more symptoms.  She has IUD in place but she is concerned it may have migrated or could be contributing to the bleeding.  She is not positive whether bleeding is vaginal or urinary in source.  She is experiencing 4-5/10 pain with pain being worse at night.  No blood in stool.  She has had nausea but no vomiting.  No obvious triggers such as specific foods.  Had IUD placed in January and has had irregular spotting/menstrual cycle since then.  To manage pain has tried Tylenol, ibuprofen, and at home tramadol without improvement in symptoms.  {History (Optional):23778}  ROS: see HPI    Objective:     BP 134/86   Pulse 83   Temp 98.1 F (36.7 C) (Temporal)   Ht 4\' 10"  (1.473 m)   Wt 153 lb (69.4 kg)   SpO2 99%   BMI 31.98 kg/m  BP Readings from Last 3 Encounters:  11/19/23 134/86  10/10/23 136/84  10/06/23 128/88   Wt Readings from Last 3 Encounters:  11/19/23 153 lb (69.4 kg)  10/10/23 157 lb 8 oz (71.4 kg)  10/06/23 158 lb (71.7 kg)      Physical Exam Vitals reviewed.  Constitutional:      General: She is not in acute distress.    Appearance: Normal appearance.  HENT:     Head: Normocephalic and atraumatic.  Neck:     Vascular: No carotid bruit.  Cardiovascular:     Rate and Rhythm: Normal rate and regular rhythm.     Pulses: Normal pulses.     Heart sounds: Normal heart sounds.  Pulmonary:     Effort: Pulmonary effort is normal.     Breath sounds: Normal breath sounds.   Abdominal:     General: Abdomen is flat. Bowel sounds are normal. There is no distension.     Palpations: Abdomen is soft.     Tenderness: There is abdominal tenderness in the left lower quadrant. There is no guarding or rebound.     Comments: Mild tenderness without guarding to light palpation  Skin:    General: Skin is warm and dry.  Neurological:     General: No focal deficit present.     Mental Status: She is alert and oriented to person, place, and time.  Psychiatric:        Mood and Affect: Mood normal.        Behavior: Behavior normal.        Judgment: Judgment normal.      No results found for any visits on 11/19/23.  {Labs (Optional):23779}  The 10-year ASCVD risk score (Arnett DK, et al., 2019) is: 1%    Assessment & Plan:   Problem List Items Addressed This Visit       Digestive   Acute diverticulitis   Acute Presumptive cause of pain at this time. Treat with course of ciprofloxacin and metronidazole. Due to severity of pain will treat with hydrocodone with Tylenol 7.5-325mg  every 6  hours as needed.  Patient cautioned to avoid over 3000 mg of Tylenol in 24-hour period from all sources.  She reports understanding. Patient told if symptoms worsen in any way to proceed to the emergency department.  She reports her understanding.      Relevant Medications   HYDROcodone-acetaminophen (NORCO) 7.5-325 MG tablet   ciprofloxacin (CIPRO) 500 MG tablet   metroNIDAZOLE (FLAGYL) 500 MG tablet     Other   Abdominal pain - Primary   Acute Presumptive cause of pain at this time. Treat with course of ciprofloxacin and metronidazole. Due to severity of pain will treat with hydrocodone with Tylenol 7.5-325mg  every 6 hours as needed.  Patient cautioned to avoid over 3000 mg of Tylenol in 24-hour period from all sources.  She reports understanding. Because she is also having associated either vaginal bleeding or hematuria, will order ultrasound stat of pelvis and abdomen.   Will also check urine testing for UTI.  Further recommendations may be made based upon these results.      Relevant Medications   HYDROcodone-acetaminophen (NORCO) 7.5-325 MG tablet   ciprofloxacin (CIPRO) 500 MG tablet   metroNIDAZOLE (FLAGYL) 500 MG tablet   Other Relevant Orders   Urinalysis, Routine w reflex microscopic   Urine Culture   POCT urine pregnancy   POCT urinalysis dipstick   US PELVIC COMPLETE WITH TRANSVAGINAL   CBC with Differential/Platelet   Basic metabolic panel with GFR   Hematuria   Additional workup today ordered including transvaginal ultrasound of abdomen and pelvis as well as urine testing for possible UTI.  Further recommendations may be made based upon the results.      Relevant Orders   Microalbumin / creatinine urine ratio    Return in about 6 months (around 05/20/2024) for CPE with Maralyn Sago.    Elenore Paddy, NP

## 2023-11-19 NOTE — Telephone Encounter (Signed)
 CRITICAL VALUE STICKER  CRITICAL VALUE: WBC 24.0  RECEIVER (on-site recipient of call): Faith Evans  DATE & TIME NOTIFIED: 11/19/2023 at 04:58 pm  MESSENGER (representative from lab): Clydie Braun  MD NOTIFIED: Jiles Prows NP  TIME OF NOTIFICATION: 05:01  RESPONSE:  Will follow up with patient informing them of their results, advising them to follow up with ED if symptoms worsen

## 2023-11-19 NOTE — Telephone Encounter (Signed)
Pt was seen for this issue

## 2023-11-19 NOTE — Assessment & Plan Note (Signed)
 Acute Presumptive cause of pain at this time. Treat with course of ciprofloxacin and metronidazole. Due to severity of pain will treat with hydrocodone with Tylenol 7.5-325mg  every 6 hours as needed.  Patient cautioned to avoid over 3000 mg of Tylenol in 24-hour period from all sources.  She reports understanding. Because she is also having associated either vaginal bleeding or hematuria, will order ultrasound stat of pelvis and abdomen.  Will also check urine testing for UTI.  Further recommendations may be made based upon these results.

## 2023-11-20 ENCOUNTER — Encounter: Payer: Self-pay | Admitting: Nurse Practitioner

## 2023-11-20 ENCOUNTER — Ambulatory Visit: Payer: BC Managed Care – PPO | Admitting: Nurse Practitioner

## 2023-11-20 LAB — POCT URINALYSIS DIPSTICK
Bilirubin, UA: NEGATIVE
Blood, UA: POSITIVE
Glucose, UA: NEGATIVE
Ketones, UA: NEGATIVE
Leukocytes, UA: NEGATIVE
Nitrite, UA: NEGATIVE
Protein, UA: POSITIVE — AB
Spec Grav, UA: 1.02 (ref 1.010–1.025)
Urobilinogen, UA: 1 U/dL
pH, UA: 6 (ref 5.0–8.0)

## 2023-11-20 LAB — URINALYSIS, ROUTINE W REFLEX MICROSCOPIC
Ketones, ur: NEGATIVE
Leukocytes,Ua: NEGATIVE
Nitrite: NEGATIVE
Specific Gravity, Urine: 1.02 (ref 1.000–1.030)
Total Protein, Urine: 30 — AB
Urine Glucose: NEGATIVE
Urobilinogen, UA: 2 — AB (ref 0.0–1.0)
pH: 6 (ref 5.0–8.0)

## 2023-11-20 LAB — URINE CULTURE: Result:: NO GROWTH

## 2023-11-20 LAB — POCT URINE PREGNANCY: Preg Test, Ur: NEGATIVE

## 2023-11-20 NOTE — Telephone Encounter (Signed)
 Followed up with patient and made them aware of results as well as ED recommendations following worsening symptoms. Patient expressed understanding and will follow up as needed.

## 2023-11-21 ENCOUNTER — Other Ambulatory Visit: Payer: Self-pay | Admitting: Nurse Practitioner

## 2023-11-21 DIAGNOSIS — R319 Hematuria, unspecified: Secondary | ICD-10-CM

## 2023-11-23 ENCOUNTER — Encounter (HOSPITAL_BASED_OUTPATIENT_CLINIC_OR_DEPARTMENT_OTHER): Payer: Self-pay | Admitting: Emergency Medicine

## 2023-11-23 ENCOUNTER — Emergency Department (HOSPITAL_BASED_OUTPATIENT_CLINIC_OR_DEPARTMENT_OTHER)

## 2023-11-23 ENCOUNTER — Other Ambulatory Visit: Payer: Self-pay

## 2023-11-23 ENCOUNTER — Inpatient Hospital Stay (HOSPITAL_BASED_OUTPATIENT_CLINIC_OR_DEPARTMENT_OTHER)
Admission: EM | Admit: 2023-11-23 | Discharge: 2023-11-26 | DRG: 391 | Disposition: A | Discharge status: Home / Self Care | Attending: Family Medicine | Admitting: Family Medicine

## 2023-11-23 DIAGNOSIS — B3731 Acute candidiasis of vulva and vagina: Secondary | ICD-10-CM | POA: Diagnosis present

## 2023-11-23 DIAGNOSIS — Z809 Family history of malignant neoplasm, unspecified: Secondary | ICD-10-CM

## 2023-11-23 DIAGNOSIS — Z98891 History of uterine scar from previous surgery: Secondary | ICD-10-CM | POA: Diagnosis not present

## 2023-11-23 DIAGNOSIS — E66812 Obesity, class 2: Secondary | ICD-10-CM | POA: Diagnosis present

## 2023-11-23 DIAGNOSIS — K5720 Diverticulitis of large intestine with perforation and abscess without bleeding: Principal | ICD-10-CM | POA: Diagnosis present

## 2023-11-23 DIAGNOSIS — K651 Peritoneal abscess: Secondary | ICD-10-CM | POA: Diagnosis present

## 2023-11-23 DIAGNOSIS — K219 Gastro-esophageal reflux disease without esophagitis: Secondary | ICD-10-CM | POA: Diagnosis present

## 2023-11-23 DIAGNOSIS — E876 Hypokalemia: Secondary | ICD-10-CM | POA: Diagnosis not present

## 2023-11-23 DIAGNOSIS — A419 Sepsis, unspecified organism: Secondary | ICD-10-CM | POA: Diagnosis not present

## 2023-11-23 DIAGNOSIS — F419 Anxiety disorder, unspecified: Secondary | ICD-10-CM | POA: Diagnosis present

## 2023-11-23 DIAGNOSIS — E785 Hyperlipidemia, unspecified: Secondary | ICD-10-CM | POA: Diagnosis present

## 2023-11-23 DIAGNOSIS — I10 Essential (primary) hypertension: Secondary | ICD-10-CM | POA: Diagnosis present

## 2023-11-23 DIAGNOSIS — K760 Fatty (change of) liver, not elsewhere classified: Secondary | ICD-10-CM | POA: Diagnosis present

## 2023-11-23 DIAGNOSIS — Z9102 Food additives allergy status: Secondary | ICD-10-CM | POA: Diagnosis not present

## 2023-11-23 DIAGNOSIS — Z880 Allergy status to penicillin: Secondary | ICD-10-CM | POA: Diagnosis not present

## 2023-11-23 DIAGNOSIS — Z6835 Body mass index (BMI) 35.0-35.9, adult: Secondary | ICD-10-CM

## 2023-11-23 DIAGNOSIS — E860 Dehydration: Secondary | ICD-10-CM | POA: Diagnosis present

## 2023-11-23 DIAGNOSIS — Z79899 Other long term (current) drug therapy: Secondary | ICD-10-CM

## 2023-11-23 DIAGNOSIS — Z604 Social exclusion and rejection: Secondary | ICD-10-CM | POA: Diagnosis present

## 2023-11-23 DIAGNOSIS — Z820 Family history of epilepsy and other diseases of the nervous system: Secondary | ICD-10-CM

## 2023-11-23 DIAGNOSIS — L0291 Cutaneous abscess, unspecified: Secondary | ICD-10-CM | POA: Diagnosis present

## 2023-11-23 DIAGNOSIS — Z800 Family history of malignant neoplasm of digestive organs: Secondary | ICD-10-CM | POA: Diagnosis not present

## 2023-11-23 LAB — URINALYSIS, W/ REFLEX TO CULTURE (INFECTION SUSPECTED)
Bilirubin Urine: NEGATIVE
Glucose, UA: NEGATIVE mg/dL
Ketones, ur: 40 mg/dL — AB
Nitrite: NEGATIVE
Protein, ur: 30 mg/dL — AB
Specific Gravity, Urine: 1.029 (ref 1.005–1.030)
pH: 5.5 (ref 5.0–8.0)

## 2023-11-23 LAB — HEPATIC FUNCTION PANEL
ALT: 34 U/L (ref 0–44)
AST: 22 U/L (ref 15–41)
Albumin: 3.7 g/dL (ref 3.5–5.0)
Alkaline Phosphatase: 94 U/L (ref 38–126)
Bilirubin, Direct: 0.1 mg/dL (ref 0.0–0.2)
Indirect Bilirubin: 0.3 mg/dL (ref 0.3–0.9)
Total Bilirubin: 0.4 mg/dL (ref 0.0–1.2)
Total Protein: 7.3 g/dL (ref 6.5–8.1)

## 2023-11-23 LAB — BASIC METABOLIC PANEL WITH GFR
Anion gap: 9 (ref 5–15)
BUN: 8 mg/dL (ref 6–20)
CO2: 28 mmol/L (ref 22–32)
Calcium: 8.8 mg/dL — ABNORMAL LOW (ref 8.9–10.3)
Chloride: 100 mmol/L (ref 98–111)
Creatinine, Ser: 0.52 mg/dL (ref 0.44–1.00)
GFR, Estimated: >60 mL/min (ref >60)
Glucose, Bld: 98 mg/dL (ref 70–99)
Potassium: 3.6 mmol/L (ref 3.5–5.1)
Sodium: 137 mmol/L (ref 135–145)

## 2023-11-23 LAB — LACTIC ACID, PLASMA
Lactic Acid, Venous: 1 mmol/L (ref 0.5–1.9)
Lactic Acid, Venous: 0.9 mmol/L (ref 0.5–1.9)

## 2023-11-23 LAB — WET PREP, GENITAL
Clue Cells Wet Prep HPF POC: NONE SEEN
Sperm: NONE SEEN
Trich, Wet Prep: NONE SEEN
WBC, Wet Prep HPF POC: >=10 — AB (ref <10)

## 2023-11-23 LAB — CBC
HCT: 38.2 % (ref 36.0–46.0)
Hemoglobin: 12.9 g/dL (ref 12.0–15.0)
MCH: 31.2 pg (ref 26.0–34.0)
MCHC: 33.8 g/dL (ref 30.0–36.0)
MCV: 92.3 fL (ref 80.0–100.0)
Platelets: 418 10*3/uL — ABNORMAL HIGH (ref 150–400)
RBC: 4.14 MIL/uL (ref 3.87–5.11)
RDW: 14.1 % (ref 11.5–15.5)
WBC: 25.2 10*3/uL — ABNORMAL HIGH (ref 4.0–10.5)
nRBC: 0 % (ref 0.0–0.2)

## 2023-11-23 LAB — HEMOGLOBIN A1C
Hgb A1c MFr Bld: 5 % (ref 4.8–5.6)
Mean Plasma Glucose: 96.8 mg/dL

## 2023-11-23 LAB — HIV ANTIBODY (ROUTINE TESTING W REFLEX): HIV Screen 4th Generation wRfx: NONREACTIVE

## 2023-11-23 LAB — PREGNANCY, URINE: Preg Test, Ur: NEGATIVE

## 2023-11-23 MED ORDER — MORPHINE SULFATE (PF) 2 MG/ML IV SOLN
2.0000 mg | INTRAVENOUS | Status: DC | PRN
  Administered 2023-11-24: 2 mg via INTRAVENOUS
  Filled 2023-11-23: qty 1

## 2023-11-23 MED ORDER — SODIUM CHLORIDE 0.9 % IV BOLUS
1000.0000 mL | Freq: Once | INTRAVENOUS | Status: AC
  Administered 2023-11-23: 1000 mL via INTRAVENOUS

## 2023-11-23 MED ORDER — BOOST / RESOURCE BREEZE PO LIQD CUSTOM
1.0000 | Freq: Three times a day (TID) | ORAL | Status: DC
  Administered 2023-11-24: 1 via ORAL

## 2023-11-23 MED ORDER — METRONIDAZOLE 500 MG/100ML IV SOLN
500.0000 mg | Freq: Once | INTRAVENOUS | Status: AC
  Administered 2023-11-23: 500 mg via INTRAVENOUS
  Filled 2023-11-23: qty 100

## 2023-11-23 MED ORDER — MORPHINE SULFATE (PF) 4 MG/ML IV SOLN
4.0000 mg | Freq: Once | INTRAVENOUS | Status: AC
  Administered 2023-11-23: 4 mg via INTRAVENOUS
  Filled 2023-11-23: qty 1

## 2023-11-23 MED ORDER — SODIUM CHLORIDE 0.9% FLUSH
3.0000 mL | Freq: Two times a day (BID) | INTRAVENOUS | Status: DC
  Administered 2023-11-23 – 2023-11-26 (×6): 3 mL via INTRAVENOUS

## 2023-11-23 MED ORDER — HYDROCODONE-ACETAMINOPHEN 5-325 MG PO TABS
1.0000 | ORAL_TABLET | ORAL | Status: DC | PRN
  Administered 2023-11-23 – 2023-11-25 (×9): 1 via ORAL
  Filled 2023-11-23 (×9): qty 1

## 2023-11-23 MED ORDER — HYDROMORPHONE HCL 1 MG/ML IJ SOLN
1.0000 mg | Freq: Once | INTRAMUSCULAR | Status: AC
  Administered 2023-11-23: 1 mg via INTRAVENOUS
  Filled 2023-11-23: qty 1

## 2023-11-23 MED ORDER — HEPARIN SODIUM (PORCINE) 5000 UNIT/ML IJ SOLN
5000.0000 [IU] | Freq: Three times a day (TID) | INTRAMUSCULAR | Status: DC
  Administered 2023-11-24: 5000 [IU] via SUBCUTANEOUS
  Filled 2023-11-23 (×5): qty 1

## 2023-11-23 MED ORDER — FLUCONAZOLE 150 MG PO TABS
150.0000 mg | ORAL_TABLET | Freq: Once | ORAL | Status: AC
  Administered 2023-11-23: 150 mg via ORAL
  Filled 2023-11-23: qty 1

## 2023-11-23 MED ORDER — IOHEXOL 300 MG/ML  SOLN
80.0000 mL | Freq: Once | INTRAMUSCULAR | Status: AC | PRN
  Administered 2023-11-23: 80 mL via INTRAVENOUS

## 2023-11-23 MED ORDER — SODIUM CHLORIDE 0.9 % IV SOLN
2.0000 g | Freq: Once | INTRAVENOUS | Status: AC
  Administered 2023-11-23: 2 g via INTRAVENOUS
  Filled 2023-11-23: qty 12.5

## 2023-11-23 MED ORDER — ONDANSETRON HCL 4 MG/2ML IJ SOLN
4.0000 mg | Freq: Once | INTRAMUSCULAR | Status: AC
  Administered 2023-11-23: 4 mg via INTRAVENOUS
  Filled 2023-11-23: qty 2

## 2023-11-23 MED ORDER — METRONIDAZOLE 500 MG/100ML IV SOLN
500.0000 mg | Freq: Two times a day (BID) | INTRAVENOUS | Status: DC
  Administered 2023-11-23 – 2023-11-26 (×6): 500 mg via INTRAVENOUS
  Filled 2023-11-23 (×6): qty 100

## 2023-11-23 MED ORDER — ACETAMINOPHEN 325 MG PO TABS
650.0000 mg | ORAL_TABLET | Freq: Four times a day (QID) | ORAL | Status: DC | PRN
  Administered 2023-11-23 – 2023-11-25 (×3): 650 mg via ORAL
  Filled 2023-11-23 (×3): qty 2

## 2023-11-23 MED ORDER — ACETAMINOPHEN 650 MG RE SUPP: 650.0000 mg | Freq: Four times a day (QID) | RECTAL | Status: DC | PRN

## 2023-11-23 MED ORDER — SODIUM CHLORIDE 0.9 % IV SOLN: INTRAVENOUS | Status: AC

## 2023-11-23 MED ORDER — SODIUM CHLORIDE 0.9 % IV SOLN
2.0000 g | Freq: Three times a day (TID) | INTRAVENOUS | Status: DC
  Administered 2023-11-23 – 2023-11-26 (×9): 2 g via INTRAVENOUS
  Filled 2023-11-23 (×9): qty 12.5

## 2023-11-23 NOTE — Progress Notes (Addendum)
 Pharmacy Antibiotic Note  Faith Evans is a 43 y.o. female admitted on 11/23/2023 with  intra-abdominal infection . Pharmacy has been consulted for cefepime dosing.  Pt with abdominal pain for few days and pelvic ultrasound with abscess. Surgery to assess. Received  cefepime and metronidazole x 1 at [facility] ED. WBC elevated, afebrile.   Plan: Start cefepime 2g IV every 8 hours Metronidazole 500 mg q12h per MD Monitor renal function, cultures, and overall clinical picture  De-escalate as able  Height: 4\' 10"  (147.3 cm) Weight: 69.4 kg (153 lb) IBW/kg (Calculated) : 40.9  Temp (24hrs), Avg:99.3 F (37.4 C), Min:99.2 F (37.3 C), Max:99.3 F (37.4 C)  Recent Labs  Lab 11/19/23 1525 11/23/23 0751  WBC 24.0 Repeated and verified X2.* 25.2*  CREATININE 0.60 0.52    Estimated Creatinine Clearance: 76.4 mL/min (by C-G formula based on SCr of 0.52 mg/dL).    Allergies  Allergen Reactions   Amoxicillin Rash   Cinnamon Rash    Antimicrobials this admission: Cefepime 4/6 >>  Metronidazole 4/6 >>    Microbiology results:  Thank you for allowing pharmacy to be a part of this patient's care.  Griffin Dakin 11/23/2023 1:45 PM

## 2023-11-23 NOTE — ED Notes (Signed)
Post-void bladder scan = 48mL.

## 2023-11-23 NOTE — ED Notes (Signed)
 Ultrasound at bedside

## 2023-11-23 NOTE — ED Provider Notes (Signed)
 [place] EMERGENCY DEPARTMENT AT [facility] Provider Note   CSN: [id number] Arrival date & time: 11/23/23  0732     History  Chief Complaint  Patient presents with   Urinary Retention    Faith Evans is a 43 y.o. female.  Pt is a 43 yo female with pmhx significant for GERD, HTN, anemia, congenital abn of lung, and anxiety.  Pt has been having abd pain for the past few days.  She did go to her pcp on 4/2 and was treated for acute diverticulitis.  She had a urine cx which did not grow any bacteria.  She did have some blood in her urine and was referred to urology.  Pt comes in today because she feels like her bladder is full and she can't urinate.  She is still having abd pain.  She has been taking her abx.       Home Medications Prior to Admission medications   Medication Sig Start Date End Date Taking? Authorizing Provider  ciprofloxacin (CIPRO) 500 MG tablet Take 1 tablet (500 mg total) by mouth 2 (two) times daily for 7 days. 11/19/23 11/26/23  Elenore Paddy, NP  diazepam (VALIUM) 2 MG tablet Take 1 tablet by mouth 60-90 minutes before procedure, if anxiety is not improved repeat dose in 60 minutes. Do not drive or operate heavy machinery after taking medication. 05/22/23   Elenore Paddy, NP  HYDROcodone-acetaminophen (NORCO) 7.5-325 MG tablet Take 1 tablet by mouth every 6 (six) hours as needed for moderate pain (pain score 4-6). 11/19/23   Elenore Paddy, NP  metroNIDAZOLE (FLAGYL) 500 MG tablet Take 1 tablet (500 mg total) by mouth 2 (two) times daily for 7 days. 11/19/23 11/26/23  Elenore Paddy, NP  potassium chloride SA (KLOR-CON M) 20 MEQ tablet Take 1 tablet (20 mEq total) by mouth daily. 05/22/23   Elenore Paddy, NP  triamcinolone cream (KENALOG) 0.1 % Apply 1 application. topically 2 (two) times daily. To affected area till better 11/21/21   Zenia Resides, MD      Allergies    Amoxicillin and Cinnamon    Review of Systems   Review of Systems  Gastrointestinal:   Positive for abdominal pain.  Genitourinary:  Positive for difficulty urinating.  All other systems reviewed and are negative.   Physical Exam Updated Vital Signs BP 128/77   Pulse 88   Temp 99.2 F (37.3 C) (Oral)   Resp 20   Ht 4\' 10"  (1.473 m)   Wt 69.4 kg   SpO2 100%   BMI 31.98 kg/m  Physical Exam Vitals and nursing note reviewed. Exam conducted with a chaperone present.  Constitutional:      Appearance: Normal appearance. She is obese.  HENT:     Head: Normocephalic and atraumatic.     Right Ear: External ear normal.     Left Ear: External ear normal.     Nose: Nose normal.     Mouth/Throat:     Mouth: Mucous membranes are dry.  Eyes:     Extraocular Movements: Extraocular movements intact.     Conjunctiva/sclera: Conjunctivae normal.     Pupils: Pupils are equal, round, and reactive to light.  Cardiovascular:     Rate and Rhythm: Normal rate and regular rhythm.     Pulses: Normal pulses.     Heart sounds: Normal heart sounds.  Pulmonary:     Effort: Pulmonary effort is normal.     Breath sounds: Normal  breath sounds.  Abdominal:     General: Abdomen is flat. Bowel sounds are normal.     Palpations: Abdomen is soft.     Tenderness: There is abdominal tenderness in the left lower quadrant.  Genitourinary:    Exam position: Lithotomy position.     Cervix: Cervical bleeding present.     Uterus: Tender.      Adnexa:        Right: Tenderness present.        Left: Tenderness present.      Comments: [place] d/c Musculoskeletal:        General: Normal range of motion.     Cervical back: Normal range of motion and neck supple.  Skin:    General: Skin is warm.     Capillary Refill: Capillary refill takes less than 2 seconds.  Neurological:     General: No focal deficit present.     Mental Status: She is alert and oriented to person, place, and time.  Psychiatric:        Mood and Affect: Mood normal.        Behavior: Behavior normal.     ED Results /  Procedures / Treatments   Labs (all labs ordered are listed, but only abnormal results are displayed) Labs Reviewed  WET PREP, GENITAL - Abnormal; Notable for the following components:      Result Value   Yeast Wet Prep HPF POC PRESENT (*)    WBC, Wet Prep HPF POC >=10 (*)    All other components within normal limits  BASIC METABOLIC PANEL WITH GFR - Abnormal; Notable for the following components:   Calcium 8.8 (*)    All other components within normal limits  CBC - Abnormal; Notable for the following components:   WBC 25.2 (*)    Platelets 418 (*)    All other components within normal limits  URINALYSIS, W/ REFLEX TO CULTURE (INFECTION SUSPECTED) - Abnormal; Notable for the following components:   Hgb urine dipstick SMALL (*)    Ketones, ur 40 (*)    Protein, ur 30 (*)    Leukocytes,Ua MODERATE (*)    Bacteria, UA RARE (*)    All other components within normal limits  PREGNANCY, URINE  HEPATIC FUNCTION PANEL  HIV ANTIBODY (ROUTINE TESTING W REFLEX)  GC/CHLAMYDIA PROBE AMP ([place]) NOT AT [facility]    EKG None  Radiology US PELVIC COMPLETE W TRANSVAGINAL AND TORSION R/O Result Date: 11/23/2023 CLINICAL DATA:  Left lower quadrant pain and nausea. Abnormal CT. Possible abscess. EXAM: TRANSABDOMINAL AND TRANSVAGINAL ULTRASOUND OF PELVIS DOPPLER ULTRASOUND OF OVARIES TECHNIQUE: Both transabdominal and transvaginal ultrasound examinations of the pelvis were performed. Transabdominal technique was performed for global imaging of the pelvis including uterus, ovaries, adnexal regions, and pelvic cul-de-sac. It was necessary to proceed with endovaginal exam following the transabdominal exam to visualize the adnexa. Color and duplex Doppler ultrasound was utilized to evaluate blood flow to the ovaries. COMPARISON:  CT of the abdomen and pelvis 11/23/2023. FINDINGS: Uterus Measurements: 10.8 x 4.0 x 4.2 cm = volume: 94.4 mL. No fibroids or other mass visualized. Endometrium Thickness: 6.0 mm.   IUD is in place. Right ovary Measurements: 3.9 x 2.6 x 1.9 cm = volume: 10.2 mL. Normal appearance/no adnexal mass. Left ovary The left ovary is not visualized. Pulsed Doppler evaluation of the right ovary demonstrates normal low-resistance arterial and venous waveforms. Other findings A heterogeneous collection in the left adnexa measures 5.0 x 4.4 x 4.9 cm. The  wall is somewhat thickened mild hyperemia surrounds the collection. This is consistent with an abscess as described on the CT scan. IMPRESSION: 1. 5.0 x 4.4 x 4.9 cm heterogeneous collection in the left adnexa is consistent with an abscess as described on the CT scan. 2. The left ovary is not visualized separate from the abscess. Ovarian etiology is not excluded. This still most likely represents a diverticular abscess. 3. Normal appearance of the uterus and right ovary. 4. IUD is in place. Electronically Signed   By: Marin Roberts M.D.   On: 11/23/2023 10:55   CT ABDOMEN PELVIS W CONTRAST Result Date: 11/23/2023 CLINICAL DATA:  Left lower quadrant abdominal pain. EXAM: CT ABDOMEN AND PELVIS WITH CONTRAST TECHNIQUE: Multidetector CT imaging of the abdomen and pelvis was performed using the standard protocol following bolus administration of intravenous contrast. RADIATION DOSE REDUCTION: This exam was performed according to the departmental dose-optimization program which includes automated exposure control, adjustment of the mA and/or kV according to patient size and/or use of iterative reconstruction technique. CONTRAST:  80mL OMNIPAQUE IOHEXOL 300 MG/ML  SOLN COMPARISON:  03/06/2023 FINDINGS: Lower chest: Asymmetric elevation right hemidiaphragm with rightward heart displacement is similar to prior. Hepatobiliary: No suspicious focal abnormality within the liver parenchyma. Small area of low attenuation in the anterior liver, adjacent to the falciform ligament, is in a characteristic location for focal fatty deposition. Cholecystectomy. No  intrahepatic or extrahepatic biliary dilation. Pancreas: No focal mass lesion. No dilatation of the main duct. No intraparenchymal cyst. No peripancreatic edema. Spleen: No splenomegaly. No suspicious focal mass lesion. Adrenals/Urinary Tract: No adrenal nodule or mass. Right kidney unremarkable. 10 mm hypodensity lower pole left kidney is similar to prior, compatible with a simple cyst. No followup imaging is recommended. No evidence for hydroureter. The urinary bladder appears normal for the degree of distention. Stomach/Bowel: Stomach is unremarkable. No gastric wall thickening. No evidence of outlet obstruction. Duodenum is normally positioned as is the ligament of Treitz. No small bowel wall thickening. No small bowel dilatation. The terminal ileum is normal. The appendix is normal. Right and transverse colon unremarkable. Diverticular disease is seen in the left colon. There is marked wall thickening in the sigmoid segment with pericolonic edema/inflammation. A 5.2 x 4.6 x 4.7 cm thick walled rim enhancing collection of gas and fluid is identified in the left pelvis. This is contiguous with the left posterolateral wall of the uterus, the inferior wall of the sigmoid colon, and the left gonadal vein passes into some amorphous soft tissue along the anterolateral margin of the lesion. Tiny gas bubbles to the left of the rectosigmoid junction on image 67/2 are probably within diverticuli but additional tiny gas locules along the low rectum (74/2) may be extraluminal. Vascular/Lymphatic: No abdominal aortic aneurysm. No abdominal aortic atherosclerotic calcification. No gastrohepatic or hepatoduodenal ligament lymphadenopathy. Small to upper normal retroperitoneal lymph nodes are seen in the region of the aortic bifurcation. Upper normal left pelvic sidewall lymph nodes evident. Reproductive: IUD is visualized in the uterus although position may be low, towards the internal cervical os. Dominant follicle noted  right ovary. Left ovary is not discretely visible given the left lower quadrant inflammatory change described above. Other: No substantial intraperitoneal free fluid. Musculoskeletal: No worrisome lytic or sclerotic osseous abnormality. IMPRESSION: 1. 5.2 x 4.6 x 4.7 cm thick walled rim enhancing collection of gas and fluid in the left pelvis is compatible with an abscess. This is contiguous with the left posterolateral wall of the uterus, the inferior  wall of the sigmoid colon, and the left gonadal vein. 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 Pelvic ultrasound may prove helpful if the left ovary can be identified as separate from this inflammatory process. 2. Tiny gas locules to the left of the rectosigmoid junction are probably within diverticuli but additional tiny gas locules along the low rectum appear to be extraluminal. 3. IUD is visualized in the uterus although position may be low, towards the internal cervical os. 4. Asymmetric elevation right hemidiaphragm with rightward heart displacement is similar to prior. Electronically Signed   By: Kennith Center M.D.   On: 11/23/2023 09:02    Procedures Procedures    Medications Ordered in ED Medications  ceFEPIme (MAXIPIME) 2 g in sodium chloride 0.9 % 100 mL IVPB (2 g Intravenous New Bag/Given 11/23/23 0921)    And  metroNIDAZOLE (FLAGYL) IVPB 500 mg (500 mg Intravenous New Bag/Given 11/23/23 1032)  sodium chloride 0.9 % bolus 1,000 mL (1,000 mLs Intravenous New Bag/Given 11/23/23 0827)  morphine (PF) 4 MG/ML injection 4 mg (4 mg Intravenous Given 11/23/23 0825)  ondansetron (ZOFRAN) injection 4 mg (4 mg Intravenous Given 11/23/23 0825)  iohexol (OMNIPAQUE) 300 MG/ML solution 80 mL (80 mLs Intravenous Contrast  Given 11/23/23 0841)  morphine (PF) 4 MG/ML injection 4 mg (4 mg Intravenous Given 11/23/23 0916)  fluconazole (DIFLUCAN) tablet 150 mg (150 mg Oral Given 11/23/23 1031)  HYDROmorphone (DILAUDID) injection 1 mg (1 mg Intravenous Given 11/23/23 1039)    ED Course/ Medical Decision Making/ A&P                                 Medical Decision Making Amount and/or Complexity of Data Reviewed Labs: ordered. Radiology: ordered.  Risk Prescription drug management. Decision regarding hospitalization.   This patient presents to the ED for concern of abd pain, this involves an extensive number of treatment options, and is a complaint that carries with it a high risk of complications and morbidity.  The differential diagnosis includes uti, kidney stone, diverticulitis, pregnancy, retention   Co morbidities that complicate the patient evaluation  GERD, HTN, anemia, congenital abn of lung, and anxiety   Additional history obtained:  Additional history obtained from epic chart review External records from outside source obtained and reviewed including husband   Lab Tests:  I Ordered, and personally interpreted labs.  The pertinent results include:  wbc elevated at 25.3, preg neg, ua + ketones and protein, wet prep + yeast   Imaging Studies ordered:  I ordered imaging studies including ct abd/pelvis and [place] I independently visualized and interpreted imaging which showed  CT abd/pelvis 1. 5.2 x 4.6 x 4.7 cm thick walled rim enhancing collection of gas  and fluid in the left pelvis is compatible with an abscess. This is  contiguous with the left posterolateral wall of the uterus, the  inferior wall of the sigmoid colon, and the left gonadal vein. Given  the advanced diverticular disease in the sigmoid colon this is  probably a diverticular abscess although left ovary is not  discretely visible and the left gonadal vein tracks into soft tissue  along the anterolateral margin of the abscess  making tubo-ovarian  abscess another possibility. Pelvic ultrasound may  prove helpful if  the left ovary can be identified as separate from this inflammatory  process.  2. Tiny gas locules to the left of the rectosigmoid junction are  probably within diverticuli but additional tiny gas locules along  the low rectum appear to be extraluminal.  3. IUD is visualized in the uterus although position may be low,  towards the internal cervical os.  4. Asymmetric elevation right hemidiaphragm with rightward heart  displacement is similar to prior.  [place]: 1. 5.0 x 4.4 x 4.9 cm heterogeneous collection in the left adnexa is  consistent with an abscess as described on the CT scan.  2. The left ovary is not visualized separate from the abscess.  Ovarian etiology is not excluded. This still most likely represents  a diverticular abscess.  3. Normal appearance of the uterus and right ovary.  4. IUD is in place.   I agree with the radiologist interpretation   Medicines ordered and prescription drug management:  I ordered medication including ivfs/morphine/zoran  for sx  Reevaluation of the patient after these medicines showed that the patient improved I have reviewed the patients home medicines and have made adjustments as needed   Test Considered:  ct   Critical Interventions:  Abx, pain meds   Consultations Obtained:  I requested consultation with the surgeon (Dr. Bedelia Person),  and discussed lab and imaging findings as well as pertinent plan - she will see pt in consult Pt d/w Dr. Maryjean Ka ([facility]) who will admit   Problem List / ED Course:  Decreased urination:  likely dehydration.  Urine with ketones.  Minimal urine on post void residual scan. Abd pain:  likely diverticular abscess.  Pt has been on cipro/flagyl for 4 days.  She is allergic to pcn, so is given maxipime and flagyl IV Vaginal candidiasis:  diflucan given in the ED   Reevaluation:  After the interventions noted above, I  reevaluated the patient and found that they have :improved   Social Determinants of Health:  Lives at home   Dispostion:  After consideration of the diagnostic results and the patients response to treatment, I feel that the patent would benefit from admission.          Final Clinical Impression(s) / ED Diagnoses Final diagnoses:  Colonic diverticular abscess  Vaginal candidiasis    Rx / DC Orders ED Discharge Orders     None         Jacalyn Lefevre, MD 11/23/23 1101

## 2023-11-23 NOTE — Consult Note (Signed)
 Chief Complaint: Pelvic Abscess. Request is for pelvic abscess drain placement.  Referring Physician(s): Dr. Kris Mouton  Supervising Physician: Ruel Favors  Patient Status: [name] - In-pt  History of Present Illness: Faith Evans is a 43 y.o. female 43 y.o. female inpatient. History of HTN, GERD, anemia, HELP Syndrome. Recently treated for acute diverticulitis at PCP office. On antibiotics.  Presented to the ED At [facility] on 4.6. 24 with urinary retention and LLQ abdominal pain. Found to have a left adnexa abscess. CT abd pelvis from 4.6.25 reads 5.2 x 4.6 x 4.7 cm thick walled rim enhancing collection of gas and fluid in the left pelvis is compatible with an abscess. This is contiguous with the left posterolateral wall of the uterus, the inferior wall of the sigmoid colon, and the left gonadal vein. Given the advanced diverticular disease in the sigmoid colon this is probably a diverticular abscess although left ovary is not discretely visible and the left gonadal vein tracks into soft tissue. US pelvic complete from 4.6.25 reads .0 x 4.4 x 4.9 cm heterogeneous collection in the left adnexa is consistent with an abscess as described on the CT scan. Team is requesting an abscess drain placement. Case approved by IR Attending Dr. Gilmer Mor.  Husband at bedside. Patient alert and laying in bed,calm. Endorses LLQ and back pain. Denies any fevers, headache, chest pain, SOB, cough, abdominal pain, nausea, vomiting or bleeding.   WBC 25.2. All other labs and medications are within acceptable parameters. Allergies include amoxicllin. Patient has been NPO since midnight. Last recorded temp. 99.8.  Return precautions and treatment recommendations and follow-up discussed with the patient and her husband. Both who are agreeable with the plan.    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   Fatty liver    GERD (gastroesophageal reflux disease)    History of hemolysis, elevated liver enzymes, and low platelet (HELLP) syndrome 2007   had pre-eclampsia and Toxemia   Hypertension    Pneumonia     Past Surgical History:  Procedure Laterality Date   CESAREAN SECTION  2007   emergent d/t HELLP, Toxemia   CHOLECYSTECTOMY N/A 01/22/2023   Procedure: LAPAROSCOPIC CHOLECYSTECTOMY WITH ICG DYE;  Surgeon: Berna Bue, MD;  Location: [facility] ORS;  Service: General;  Laterality: N/A;   NO PAST SURGERIES     WISDOM TOOTH EXTRACTION      Allergies: Amoxicillin and Cinnamon  Medications: Prior to Admission medications   Medication Sig Start Date End Date Taking? Authorizing Provider  acetaminophen (TYLENOL) 500 MG tablet Take 1,000 mg by mouth every 6 (six) hours as needed for moderate pain (pain score 4-6).   Yes [provider]  ciprofloxacin (CIPRO) 500 MG tablet Take 1 tablet (500 mg total) by mouth 2 (two) times daily for 7 days. 11/19/23 11/26/23 Yes Elenore Paddy, NP  HYDROcodone-acetaminophen (NORCO) 7.5-325 MG tablet Take 1 tablet by mouth every 6 (six) hours as needed for moderate pain (pain score 4-6). 11/19/23  Yes Elenore Paddy, NP  ibuprofen (ADVIL) 200 MG tablet Take 400 mg by mouth every 6 (six) hours as needed for moderate pain (pain score 4-6).   Yes [provider]  metroNIDAZOLE (FLAGYL) 500 MG tablet Take 1 tablet (500 mg total) by mouth 2 (two) times daily for 7 days. 11/19/23  11/26/23 Yes Elenore Paddy, NP  triamcinolone cream (KENALOG) 0.1 % Apply 1 application. topically 2 (two) times daily. To affected area till better Patient taking differently: Apply 1 application  topically daily as needed (Dermatitis). 11/21/21  Yes Banister, Janace Aris, MD     Family History  Problem Relation Age of Onset   Cancer Mother    Seizures Father    Cancer Maternal Grandfather    Colon cancer Neg Hx    Rectal cancer Neg Hx    Stomach  cancer Neg Hx    Esophageal cancer Neg Hx     Social History   Socioeconomic History   Marital status: Married    Spouse name: Not on file   Number of children: Not on file   Years of education: Not on file   Highest education level: 12th grade  Occupational History   Not on file  Tobacco Use   Smoking status: Never   Smokeless tobacco: Never  Vaping Use   Vaping status: Never Used  Substance and Sexual Activity   Alcohol use: Yes    Comment: occasionally   Drug use: Never   Sexual activity: Yes  Other Topics Concern   Not on file  Social History Narrative   Not on file   Social Drivers of Health   Financial Resource Strain: Low Risk  (11/19/2023)   Overall Financial Resource Strain (CARDIA)    Difficulty of Paying Living Expenses: Not hard at all  Food Insecurity: No Food Insecurity (11/23/2023)   Hunger Vital Sign    Worried About Running Out of Food in the Last Year: Never true    Ran Out of Food in the Last Year: Never true  Transportation Needs: No Transportation Needs (11/23/2023)   PRAPARE - Administrator, Civil Service (Medical): No    Lack of Transportation (Non-Medical): No  Physical Activity: Insufficiently Active (11/19/2023)   Exercise Vital Sign    Days of Exercise per Week: 3 days    Minutes of Exercise per Session: 10 min  Stress: No Stress Concern Present (11/19/2023)   [employer] of Occupational Health - Occupational Stress Questionnaire    Feeling of Stress : Not at all  Social Connections: Socially Isolated (11/19/2023)   Social Connection and Isolation Panel [NHANES]    Frequency of Communication with Friends and Family: Once a week    Frequency of Social Gatherings with Friends and Family: Once a week    Attends Religious Services: Never    Database administrator or Organizations: No    Attends Engineer, structural: Not on file    Marital Status: Married      Review of Systems: A 12 point ROS discussed and pertinent  positives are indicated in the HPI above.  All other systems are negative.  Review of Systems  Constitutional:  Negative for fatigue and fever.  HENT:  Negative for congestion.   Respiratory:  Negative for cough and shortness of breath.   Gastrointestinal:  Positive for abdominal pain (LLQ). Negative for diarrhea, nausea and vomiting.  Musculoskeletal:  Positive for back pain.    Vital Signs: BP 108/70 (BP Location: Right Arm)   Pulse 88   Temp 99.8 F (37.7 C) (Oral)   Resp 18   Ht 4\' 10"  (1.473 m)   Wt 153 lb (69.4 kg)   SpO2 100%   BMI 31.98 kg/m     Physical Exam Vitals and nursing note reviewed.  Constitutional:  Appearance: She is well-developed. She is obese.  HENT:     Head: Normocephalic and atraumatic.     Mouth/Throat:     Mouth: Mucous membranes are dry.  Eyes:     Conjunctiva/sclera: Conjunctivae normal.  Cardiovascular:     Rate and Rhythm: Normal rate and regular rhythm.  Pulmonary:     Effort: Pulmonary effort is normal.  Musculoskeletal:        General: Normal range of motion.     Cervical back: Normal range of motion.  Skin:    General: Skin is warm and dry.  Neurological:     General: No focal deficit present.     Mental Status: She is alert and oriented to person, place, and time. Mental status is at baseline.  Psychiatric:        Mood and Affect: Mood normal.        Behavior: Behavior normal.        Thought Content: Thought content normal.        Judgment: Judgment normal.     Imaging: US PELVIC COMPLETE W TRANSVAGINAL AND TORSION R/O Result Date: 11/23/2023 CLINICAL DATA:  Left lower quadrant pain and nausea. Abnormal CT. Possible abscess. EXAM: TRANSABDOMINAL AND TRANSVAGINAL ULTRASOUND OF PELVIS DOPPLER ULTRASOUND OF OVARIES TECHNIQUE: Both transabdominal and transvaginal ultrasound examinations of the pelvis were performed. Transabdominal technique was performed for global imaging of the pelvis including uterus, ovaries, adnexal  regions, and pelvic cul-de-sac. It was necessary to proceed with endovaginal exam following the transabdominal exam to visualize the adnexa. Color and duplex Doppler ultrasound was utilized to evaluate blood flow to the ovaries. COMPARISON:  CT of the abdomen and pelvis 11/23/2023. FINDINGS: Uterus Measurements: 10.8 x 4.0 x 4.2 cm = volume: 94.4 mL. No fibroids or other mass visualized. Endometrium Thickness: 6.0 mm.  IUD is in place. Right ovary Measurements: 3.9 x 2.6 x 1.9 cm = volume: 10.2 mL. Normal appearance/no adnexal mass. Left ovary The left ovary is not visualized. Pulsed Doppler evaluation of the right ovary demonstrates normal low-resistance arterial and venous waveforms. Other findings A heterogeneous collection in the left adnexa measures 5.0 x 4.4 x 4.9 cm. The wall is somewhat thickened mild hyperemia surrounds the collection. This is consistent with an abscess as described on the CT scan. IMPRESSION: 1. 5.0 x 4.4 x 4.9 cm heterogeneous collection in the left adnexa is consistent with an abscess as described on the CT scan. 2. The left ovary is not visualized separate from the abscess. Ovarian etiology is not excluded. This still most likely represents a diverticular abscess. 3. Normal appearance of the uterus and right ovary. 4. IUD is in place. Electronically Signed   By: Marin Roberts M.D.   On: 11/23/2023 10:55   CT ABDOMEN PELVIS W CONTRAST Result Date: 11/23/2023 CLINICAL DATA:  Left lower quadrant abdominal pain. EXAM: CT ABDOMEN AND PELVIS WITH CONTRAST TECHNIQUE: Multidetector CT imaging of the abdomen and pelvis was performed using the standard protocol following bolus administration of intravenous contrast. RADIATION DOSE REDUCTION: This exam was performed according to the departmental dose-optimization program which includes automated exposure control, adjustment of the mA and/or kV according to patient size and/or use of iterative reconstruction technique. CONTRAST:  80mL  OMNIPAQUE IOHEXOL 300 MG/ML  SOLN COMPARISON:  03/06/2023 FINDINGS: Lower chest: Asymmetric elevation right hemidiaphragm with rightward heart displacement is similar to prior. Hepatobiliary: No suspicious focal abnormality within the liver parenchyma. Small area of low attenuation in the anterior liver, adjacent to the falciform  ligament, is in a characteristic location for focal fatty deposition. Cholecystectomy. No intrahepatic or extrahepatic biliary dilation. Pancreas: No focal mass lesion. No dilatation of the main duct. No intraparenchymal cyst. No peripancreatic edema. Spleen: No splenomegaly. No suspicious focal mass lesion. Adrenals/Urinary Tract: No adrenal nodule or mass. Right kidney unremarkable. 10 mm hypodensity lower pole left kidney is similar to prior, compatible with a simple cyst. No followup imaging is recommended. No evidence for hydroureter. The urinary bladder appears normal for the degree of distention. Stomach/Bowel: Stomach is unremarkable. No gastric wall thickening. No evidence of outlet obstruction. Duodenum is normally positioned as is the ligament of Treitz. No small bowel wall thickening. No small bowel dilatation. The terminal ileum is normal. The appendix is normal. Right and transverse colon unremarkable. Diverticular disease is seen in the left colon. There is marked wall thickening in the sigmoid segment with pericolonic edema/inflammation. A 5.2 x 4.6 x 4.7 cm thick walled rim enhancing collection of gas and fluid is identified in the left pelvis. This is contiguous with the left posterolateral wall of the uterus, the inferior wall of the sigmoid colon, and the left gonadal vein passes into some amorphous soft tissue along the anterolateral margin of the lesion. Tiny gas bubbles to the left of the rectosigmoid junction on image 67/2 are probably within diverticuli but additional tiny gas locules along the low rectum (74/2) may be extraluminal. Vascular/Lymphatic: No abdominal  aortic aneurysm. No abdominal aortic atherosclerotic calcification. No gastrohepatic or hepatoduodenal ligament lymphadenopathy. Small to upper normal retroperitoneal lymph nodes are seen in the region of the aortic bifurcation. Upper normal left pelvic sidewall lymph nodes evident. Reproductive: IUD is visualized in the uterus although position may be low, towards the internal cervical os. Dominant follicle noted right ovary. Left ovary is not discretely visible given the left lower quadrant inflammatory change described above. Other: No substantial intraperitoneal free fluid. Musculoskeletal: No worrisome lytic or sclerotic osseous abnormality. IMPRESSION: 1. 5.2 x 4.6 x 4.7 cm thick walled rim enhancing collection of gas and fluid in the left pelvis is compatible with an abscess. This is contiguous with the left posterolateral wall of the uterus, the inferior wall of the sigmoid colon, and the left gonadal vein. 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 Pelvic ultrasound may prove helpful if the left ovary can be identified as separate from this inflammatory process. 2. Tiny gas locules to the left of the rectosigmoid junction are probably within diverticuli but additional tiny gas locules along the low rectum appear to be extraluminal. 3. IUD is visualized in the uterus although position may be low, towards the internal cervical os. 4. Asymmetric elevation right hemidiaphragm with rightward heart displacement is similar to prior. Electronically Signed   By: Kennith Center M.D.   On: 11/23/2023 09:02    Labs:  CBC: Recent Labs    10/06/23 1005 10/10/23 0839 11/19/23 1525 11/23/23 0751  WBC 21.5 Repeated and verified X2.* 12.0* 24.0 Repeated and verified  X2.* 25.2*  HGB 14.2 14.7 13.4 12.9  HCT 42.7 43.3 39.9 38.2  PLT 260.0 383.0 278.0 418*    COAGS: No results for input(s): "INR", "APTT" in the last 8760 hours.  BMP: Recent Labs    01/14/23 0834 03/06/23 1417 10/06/23 1005 10/10/23 0839 11/19/23 1525 11/23/23  0751  NA 139   < > 138 140 135 137  K 3.7   < > 3.5 3.5 3.2* 3.6  CL 105   < > 101 101 97 100  CO2 26   < > 28 29 31 28   GLUCOSE 97   < > 101* 97 105* 98  BUN 13   < > 8 7 13 8   CALCIUM 8.7*   < > 9.0 8.9 9.3 8.8*  CREATININE 0.72   < > 0.68 0.69 0.60 0.52  GFRNONAA >60  --   --   --   --  >60   < > = values in this interval not displayed.    LIVER FUNCTION TESTS: Recent Labs    03/06/23 1417 05/22/23 1125 10/06/23 1005 11/23/23 0751  BILITOT 0.7 0.5 0.7 0.4  AST 60* 19 66* 22  ALT 78* 19 97* 34  ALKPHOS 92 56 100 94  PROT 7.0 7.6 7.5 7.3  ALBUMIN 4.4 4.5 4.2 3.7    Assessment and Plan: 43 y.o. female inpatient. History of HTN, GERD, anemia, HELP Syndrome. Recently treated for acute diverticulitis at PCP office. On antibiotics.  Presented to the ED At [facility] on 4.6. 24 with urinary retention and LLQ abdominal pain. Found to have a left adnexa abscess. CT abd pelvis from 4.6.25 reads 5.2 x 4.6 x 4.7 cm thick walled rim enhancing collection of gas and fluid in the left pelvis is compatible with an abscess. This is contiguous with the left posterolateral wall of the uterus, the inferior wall of the sigmoid colon, and the left gonadal vein. Given the advanced diverticular disease in the sigmoid colon this is probably a diverticular abscess although left ovary is not discretely visible and the left gonadal vein tracks into soft tissue. US pelvic complete from 4.6.25 reads .0 x 4.4 x 4.9 cm heterogeneous collection in the left adnexa is consistent with an abscess as described on the CT scan. Team is requesting an abscess drain placement. Case approved by IR Attending Dr. Gilmer Mor.   PLAN: IR Image Guided Abscess  Drain Placement.   Risks and benefits discussed with the patient including bleeding, infection, damage to adjacent structures, bowel perforation/fistula connection, and sepsis.  All of the patient's questions were answered, patient is agreeable to proceed. Consent signed and in chart.    Thank you for this interesting consult.  I greatly enjoyed meeting ERNA BROSSARD and look forward to participating in their care.  A copy of this report was sent to the requesting provider on this date.  Electronically Signed: Alene Mires, NP 11/23/2023, 10:28 PM   I spent a total of 40 Minutes    in face to face in clinical consultation, greater than 50% of which was counseling/coordinating care for intra abdominal abscess drain

## 2023-11-23 NOTE — Progress Notes (Addendum)
 Consult received from [facility]. Plan IMS admit with surgical consult. Recommend efforts at IR drain, but position of abscess may make this difficult. Will d/w IR. Will see patient on arrival and perform full consult.   Addendum: d/w IR Dr. Loreta Ave, plan for drain 4/7  Diamantina Monks, MD General and Trauma Surgery [facility]

## 2023-11-23 NOTE — ED Notes (Signed)
 Pelvic cart at bedside.

## 2023-11-23 NOTE — H&P (Signed)
 History and Physical   History and Physical    Patient: Faith Evans JYN:[id number] DOB: [birth year] DOA: 11/23/2023 DOS: the patient was seen and examined on 11/23/2023 PCP: Elenore Paddy, NP  Patient coming from:  [facility] Chief complaint: Chief Complaint  Patient presents with   Urinary Retention   HPI:  Faith Evans is a 43 y.o. female with past medical history  of allergies to amoxicillin, cinnamon, anxiety, obesity, GERD, congenital malformation of the lung, GERD, history of help syndrome, essential hypertension, history of pneumonia, presented to [facility] earlier today with complaints of left-sided abdominal pain that is been going on for about 2 weeks patient was seen by PCP and taking antibiotics for acute diverticulitis without relief.  Patient is still tender worse with movement.  Case was discussed with general surgery on-call and they have accepted patient consult and will see when patient arrives.  ED Course:  Vital signs in the ED were notable for the following:  Vitals:   11/23/23 0756 11/23/23 0900 11/23/23 1000 11/23/23 1141  BP:  122/75 128/77 114/74  Pulse:  85 88 89  Temp:    99.3 F (37.4 C)  Resp:    18  Height:      Weight:      SpO2: 100% 96% 100% 100%  TempSrc:    Oral  BMI (Calculated):      >> At [facility] patient had ultrasound showing a large heterogeneous collection in the left adnexa concerning for abscess as described on CT scan. >> Patient also underwent a CT scan showing concerning findings 1. 5.2 x 4.6 x 4.7 cm thick walled rim enhancing collection of gas and fluid in the left pelvis is compatible with an abscess. This is contiguous with the left posterolateral wall of the uterus, the inferior wall of the sigmoid colon, and the left gonadal vein. Given the advanced diverticular disease in the sigmoid colon this is probably a diverticular abscess although left ovary is not discretely visible and the left gonadal vein tracks into soft tissue  along the anterolateral margin of the abscess making tubo-ovarian abscess another possibility. Pelvic ultrasound may prove helpful if the left ovary can be identified as separate from this inflammatory process. 2. Tiny gas locules to the left of the rectosigmoid junction are probably within diverticuli but additional tiny gas locules along the low rectum appear to be extraluminal. 3. IUD is visualized in the uterus although position may be low, towards the internal cervical os. 4. Asymmetric elevation right hemidiaphragm with rightward heart displacement is similar to prior.  >>ED evaluation thus far shows: Normal CMP. CBC showing leukocytosis 25.2 platelets of 418 normal hemoglobin. >>While in the [facility] ED patient received Cefepime. Cefepime Flagyl Zofran morphine Diflucan and Dilaudid.  Review of Systems  Gastrointestinal:  Positive for abdominal pain and nausea.   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 She has never used smokeless tobacco. She reports current alcohol use. She reports that she does not use drugs. Allergies  Allergen Reactions   Amoxicillin Rash   Cinnamon Rash   Family History  Problem Relation Age of Onset   Cancer Mother    Seizures Father    Cancer  Maternal Grandfather    Colon cancer Neg Hx    Rectal cancer Neg Hx    Stomach cancer Neg Hx    Esophageal cancer Neg Hx    Prior to Admission medications   Medication Sig Start Date End Date Taking? Authorizing Provider  ciprofloxacin (CIPRO) 500 MG tablet Take 1 tablet (500 mg total) by mouth 2 (two) times daily for 7 days. 11/19/23 11/26/23  Elenore Paddy, NP  diazepam (VALIUM) 2 MG tablet Take 1 tablet by mouth 60-90 minutes before procedure, if anxiety is not improved repeat dose in 60 minutes. Do not drive or operate heavy machinery after taking medication. 05/22/23   Elenore Paddy, NP  HYDROcodone-acetaminophen (NORCO) 7.5-325 MG tablet Take 1 tablet by mouth every 6 (six) hours as needed for moderate pain (pain score 4-6). 11/19/23   Elenore Paddy, NP  metroNIDAZOLE (FLAGYL) 500 MG tablet Take 1 tablet (500 mg total) by mouth 2 (two) times daily for 7 days. 11/19/23 11/26/23  Elenore Paddy, NP  potassium chloride SA (KLOR-CON M) 20 MEQ tablet Take 1 tablet (20 mEq total) by mouth daily. 05/22/23   Elenore Paddy, NP  triamcinolone cream (KENALOG) 0.1 % Apply 1 application. topically 2 (two) times daily. To affected area till better 11/21/21   Zenia Resides, MD                                                                                 Vitals:   11/23/23 0756 11/23/23 0900 11/23/23 1000 11/23/23 1141  BP:  122/75 128/77 114/74  Pulse:  85 88 89  Resp:    18  Temp:    99.3 F (37.4 C)  TempSrc:    Oral  SpO2: 100% 96% 100% 100%  Weight:      Height:       Physical Exam Vitals and nursing note reviewed.  Constitutional:      General: She is not in acute distress.    Appearance: She is obese. She is ill-appearing.  HENT:     Head: Normocephalic and atraumatic.     Right Ear: Hearing normal.     Left Ear: Hearing normal.     Nose: Nose normal. No nasal deformity.     Mouth/Throat:     Lips: Pink.     Tongue: No lesions.     Pharynx: Oropharynx is clear.  Eyes:     General:  Lids are normal.     Extraocular Movements: Extraocular movements intact.  Cardiovascular:     Rate and Rhythm: Normal rate and regular rhythm.     Heart sounds: Normal heart sounds.  Pulmonary:  Effort: Pulmonary effort is normal.     Breath sounds: Normal breath sounds.  Abdominal:     General: There is no distension.     Palpations: Abdomen is soft. There is no mass.     Tenderness: There is abdominal tenderness. There is guarding and rebound.    Musculoskeletal:     Right lower leg: No edema.     Left lower leg: No edema.  Skin:    General: Skin is warm.  Neurological:     General: No focal deficit present.     Mental Status: She is alert and oriented to person, place, and time.     Cranial Nerves: Cranial nerves 2-12 are intact.  Psychiatric:        Attention and Perception: Attention normal.        Mood and Affect: Mood normal.        Speech: Speech normal.        Behavior: Behavior normal. Behavior is cooperative.     Labs on Admission: I have personally reviewed following labs and imaging studies CBC: Recent Labs  Lab 11/19/23 1525 11/23/23 0751  WBC 24.0 Repeated and verified X2.* 25.2*  NEUTROABS 19.8*  --   HGB 13.4 12.9  HCT 39.9 38.2  MCV 93.2 92.3  PLT 278.0 418*   Basic Metabolic Panel: Recent Labs  Lab 11/19/23 1525 11/23/23 0751  NA 135 137  K 3.2* 3.6  CL 97 100  CO2 31 28  GLUCOSE 105* 98  BUN 13 8  CREATININE 0.60 0.52  CALCIUM 9.3 8.8*   GFR: Estimated Creatinine Clearance: 76.4 mL/min (by C-G formula based on SCr of 0.52 mg/dL). Liver Function Tests: Recent Labs  Lab 11/23/23 0751  AST 22  ALT 34  ALKPHOS 94  BILITOT 0.4  PROT 7.3  ALBUMIN 3.7   No results for input(s): "LIPASE", "AMYLASE" in the last 168 hours. No results for input(s): "AMMONIA" in the last 168 hours. Coagulation Profile: No results for input(s): "INR", "PROTIME" in the last 168 hours. Cardiac Enzymes: No results for input(s): "CKTOTAL", "CKMB",  "CKMBINDEX", "TROPONINI" in the last 168 hours. BNP (last 3 results) No results for input(s): "PROBNP" in the last 8760 hours. HbA1C: No results for input(s): "HGBA1C" in the last 72 hours. CBG: No results for input(s): "GLUCAP" in the last 168 hours. Lipid Profile: No results for input(s): "CHOL", "HDL", "LDLCALC", "TRIG", "CHOLHDL", "LDLDIRECT" in the last 72 hours. Thyroid Function Tests: No results for input(s): "TSH", "T4TOTAL", "FREET4", "T3FREE", "THYROIDAB" in the last 72 hours. Anemia Panel: No results for input(s): "VITAMINB12", "FOLATE", "FERRITIN", "TIBC", "IRON", "RETICCTPCT" in the last 72 hours. Urine analysis:    Component Value Date/Time   COLORURINE YELLOW 11/23/2023 0751   APPEARANCEUR CLEAR 11/23/2023 0751   LABSPEC 1.029 11/23/2023 0751   PHURINE 5.5 11/23/2023 0751   GLUCOSEU NEGATIVE 11/23/2023 0751   GLUCOSEU NEGATIVE 11/19/2023 1525   HGBUR SMALL (A) 11/23/2023 0751   BILIRUBINUR NEGATIVE 11/23/2023 0751   BILIRUBINUR negative 11/20/2023 0802   KETONESUR 40 (A) 11/23/2023 0751   PROTEINUR 30 (A) 11/23/2023 0751   UROBILINOGEN 1.0 11/20/2023 0802   UROBILINOGEN 2.0 (A) 11/19/2023 1525   NITRITE NEGATIVE 11/23/2023 0751   LEUKOCYTESUR MODERATE (A) 11/23/2023 0751   Radiological Exams on Admission: US PELVIC COMPLETE W TRANSVAGINAL AND TORSION R/O Result Date: 11/23/2023 CLINICAL DATA:  Left lower quadrant pain and nausea. Abnormal CT. Possible abscess. EXAM: TRANSABDOMINAL AND TRANSVAGINAL ULTRASOUND OF PELVIS DOPPLER ULTRASOUND OF OVARIES TECHNIQUE: Both transabdominal and  transvaginal ultrasound examinations of the pelvis were performed. Transabdominal technique was performed for global imaging of the pelvis including uterus, ovaries, adnexal regions, and pelvic cul-de-sac. It was necessary to proceed with endovaginal exam following the transabdominal exam to visualize the adnexa. Color and duplex Doppler ultrasound was utilized to evaluate blood flow to the  ovaries. COMPARISON:  CT of the abdomen and pelvis 11/23/2023. FINDINGS: Uterus Measurements: 10.8 x 4.0 x 4.2 cm = volume: 94.4 mL. No fibroids or other mass visualized. Endometrium Thickness: 6.0 mm.  IUD is in place. Right ovary Measurements: 3.9 x 2.6 x 1.9 cm = volume: 10.2 mL. Normal appearance/no adnexal mass. Left ovary The left ovary is not visualized. Pulsed Doppler evaluation of the right ovary demonstrates normal low-resistance arterial and venous waveforms. Other findings A heterogeneous collection in the left adnexa measures 5.0 x 4.4 x 4.9 cm. The wall is somewhat thickened mild hyperemia surrounds the collection. This is consistent with an abscess as described on the CT scan. IMPRESSION: 1. 5.0 x 4.4 x 4.9 cm heterogeneous collection in the left adnexa is consistent with an abscess as described on the CT scan. 2. The left ovary is not visualized separate from the abscess. Ovarian etiology is not excluded. This still most likely represents a diverticular abscess. 3. Normal appearance of the uterus and right ovary. 4. IUD is in place. Electronically Signed   By: Marin Roberts M.D.   On: 11/23/2023 10:55   CT ABDOMEN PELVIS W CONTRAST Result Date: 11/23/2023 CLINICAL DATA:  Left lower quadrant abdominal pain. EXAM: CT ABDOMEN AND PELVIS WITH CONTRAST TECHNIQUE: Multidetector CT imaging of the abdomen and pelvis was performed using the standard protocol following bolus administration of intravenous contrast. RADIATION DOSE REDUCTION: This exam was performed according to the departmental dose-optimization program which includes automated exposure control, adjustment of the mA and/or kV according to patient size and/or use of iterative reconstruction technique. CONTRAST:  80mL OMNIPAQUE IOHEXOL 300 MG/ML  SOLN COMPARISON:  03/06/2023 FINDINGS: Lower chest: Asymmetric elevation right hemidiaphragm with rightward heart displacement is similar to prior. Hepatobiliary: No suspicious focal abnormality  within the liver parenchyma. Small area of low attenuation in the anterior liver, adjacent to the falciform ligament, is in a characteristic location for focal fatty deposition. Cholecystectomy. No intrahepatic or extrahepatic biliary dilation. Pancreas: No focal mass lesion. No dilatation of the main duct. No intraparenchymal cyst. No peripancreatic edema. Spleen: No splenomegaly. No suspicious focal mass lesion. Adrenals/Urinary Tract: No adrenal nodule or mass. Right kidney unremarkable. 10 mm hypodensity lower pole left kidney is similar to prior, compatible with a simple cyst. No followup imaging is recommended. No evidence for hydroureter. The urinary bladder appears normal for the degree of distention. Stomach/Bowel: Stomach is unremarkable. No gastric wall thickening. No evidence of outlet obstruction. Duodenum is normally positioned as is the ligament of Treitz. No small bowel wall thickening. No small bowel dilatation. The terminal ileum is normal. The appendix is normal. Right and transverse colon unremarkable. Diverticular disease is seen in the left colon. There is marked wall thickening in the sigmoid segment with pericolonic edema/inflammation. A 5.2 x 4.6 x 4.7 cm thick walled rim enhancing collection of gas and fluid is identified in the left pelvis. This is contiguous with the left posterolateral wall of the uterus, the inferior wall of the sigmoid colon, and the left gonadal vein passes into some amorphous soft tissue along the anterolateral margin of the lesion. Tiny gas bubbles to the left of the rectosigmoid junction on  image 67/2 are probably within diverticuli but additional tiny gas locules along the low rectum (74/2) may be extraluminal. Vascular/Lymphatic: No abdominal aortic aneurysm. No abdominal aortic atherosclerotic calcification. No gastrohepatic or hepatoduodenal ligament lymphadenopathy. Small to upper normal retroperitoneal lymph nodes are seen in the region of the aortic  bifurcation. Upper normal left pelvic sidewall lymph nodes evident. Reproductive: IUD is visualized in the uterus although position may be low, towards the internal cervical os. Dominant follicle noted right ovary. Left ovary is not discretely visible given the left lower quadrant inflammatory change described above. Other: No substantial intraperitoneal free fluid. Musculoskeletal: No worrisome lytic or sclerotic osseous abnormality. IMPRESSION: 1. 5.2 x 4.6 x 4.7 cm thick walled rim enhancing collection of gas and fluid in the left pelvis is compatible with an abscess. This is contiguous with the left posterolateral wall of the uterus, the inferior wall of the sigmoid colon, and the left gonadal vein. 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 Pelvic ultrasound may prove helpful if the left ovary can be identified as separate from this inflammatory process. 2. Tiny gas locules to the left of the rectosigmoid junction are probably within diverticuli but additional tiny gas locules along the low rectum appear to be extraluminal. 3. IUD is visualized in the uterus although position may be low, towards the internal cervical os. 4. Asymmetric elevation right hemidiaphragm with rightward heart displacement is similar to prior. Electronically Signed   By: Kennith Center M.D.   On: 11/23/2023 09:02   Data Reviewed: Relevant notes from primary care and specialist visits, past discharge summaries as available in EHR, including Care Everywhere. Prior diagnostic testing as pertinent to current admission diagnoses, Updated medications and problem lists for reconciliation ED course, including vitals, labs, imaging, treatment and response to treatment,Triage notes, nursing and  pharmacy notes and ED provider's notes  Assessment & Plan  >>Abdominal pain/ Left Diverticular Abscess: -Cont cefepime / flagyl . -Clear liquid diet, n.p.o. after midnight. -As needed morphine or preferred pain med as tolerated and needed. -General Surgery consulted and pt to have IR drain abscess in am, we will keep pt npo.   >> Essential hypertension: -Diagnosis of hypertension in chart patient is not on any medications currently will follow and monitor. -EKG.  DVT prophylaxis:  Heparin  Consults:  General surgery  Advance Care Planning:    Code Status: Full Code   Family Communication:  Spouse  Disposition Plan:  ** Severity of Illness: The appropriate patient status for this patient is INPATIENT. Inpatient status is judged to be reasonable and necessary in order to provide the required intensity of service to ensure the patient's safety. The patient's presenting symptoms, physical exam findings, and initial radiographic and laboratory data in the context of their chronic comorbidities is felt to place them at high risk for further clinical deterioration. Furthermore, it is not anticipated that the patient will be medically stable for discharge from the hospital within 2 midnights of admission.   * I certify that at the point of admission it is my clinical judgment that the patient will require inpatient hospital care spanning beyond 2 midnights from the point of admission due to high intensity of service, high risk for further deterioration and high frequency of surveillance required.*  Unresulted Labs (From admission, onward)  Start     Ordered   11/24/23 0500  Comprehensive metabolic panel  Tomorrow morning,   R        11/23/23 1216   11/24/23 0500  CBC  Tomorrow morning,   R        11/23/23 1216   11/24/23 0500  Magnesium  Daily,   R      11/23/23 1216   11/23/23 0912  HIV Antibody (routine testing w rflx)  (HIV Antibody (Routine testing w reflex) panel)  Once,    URGENT        11/23/23 0911            Orders Placed This Encounter  Procedures   Wet prep, genital   CT ABDOMEN PELVIS W CONTRAST   US PELVIC COMPLETE W TRANSVAGINAL AND TORSION R/O   Pregnancy, urine   Basic metabolic panel   CBC   Urinalysis, w/ Reflex to Culture (Infection Suspected) -Urine, Clean Catch   Hepatic function panel   HIV Antibody (routine testing w rflx)   Comprehensive metabolic panel   CBC   Magnesium   Diet NPO time specified Except for: Sips with Meds   Bladder scan   Pelvic cart   Cardiac Monitoring Continuous x 24 hours Indications for use: Other; other indications for use: abscess   Maintain IV access   Vital signs   Notify physician (specify)   Mobility Protocol: No Restrictions RN to initiate protocols based on patient's level of care   Refer to Sidebar Report Refer to ICU, Med-Surg, Progressive, and Step-Down Mobility Protocol Sidebars   Initiate Adult Central Line Maintenance and Catheter Protocol for patients with central line (CVC, PICC, Port, Hemodialysis, Trialysis)   Daily weights   Intake and Output   Do not place and if present remove PureWick   Initiate Oral Care Protocol   Initiate Carrier Fluid Protocol   RN may order General Admission PRN Orders utilizing "General Admission PRN medications" (through manage orders) for the following patient needs: allergy symptoms (Claritin), cold sores (Carmex), cough (Robitussin DM), eye irritation (Liquifilm Tears), hemorrhoids (Tucks), indigestion (Maalox), minor skin irritation (Hydrocortisone Cream), muscle pain Romeo Apple Gay), nose irritation (saline nasal spray) and sore throat (Chloraseptic spray).   Patient has an active order for admit to inpatient/place in observation   Full code   Consult to general surgery   Consult to hospitalist   Pulse oximetry check with vital signs   Oxygen therapy Mode or (Route): Nasal cannula; Liters Per Minute: 2; Keep O2 saturation between: greater than 92 %    Saline lock IV   Admit to Inpatient (patient's expected length of stay will be greater than 2 midnights or inpatient only procedure)    Author: Gertha Calkin, MD 12 pm -8 pm. 11/23/2023 12:17 PM >>Please note for any questions,concerns,abnormal labs, or critical results past 8pm please contact the after hours [facility] floor coverage provider from 7 PM- 7 AM. For on call review [email or web address], username [email or web address] and PW: your phone number.

## 2023-11-23 NOTE — ED Triage Notes (Signed)
 Pt caox4, ambulatory, NAD c/o urinary retention with L flank pain x2 wks. Pt was evaluated by PCP and had labs/urine/urine culture done with elevated WBC, neg urine culture. Pt states she feels like her bladder is full but has not been able to urinate. Has been taking abx for 4 days and pain meds without relief. Last pain med 3a.

## 2023-11-23 NOTE — ED Notes (Signed)
 Called [facility] for transport, pt bed assignment is ready

## 2023-11-23 NOTE — Progress Notes (Signed)
 Report from Dr. Particia Nearing as follows:   Priro diverticulitis rx X 4 days. Now increasing pain . LLQ tenderness. CT scan - diverticular abscess. No perforation. Dr. Bedelia Person will see in consult - either hospital is fine. Patietn is not septic vitally stable.  _____________________________________  Labs on Admission:  Results for orders placed or performed during the hospital encounter of 11/23/23 (from the past 24 hours)  Pregnancy, urine     Status: None   Collection Time: 11/23/23  7:51 AM  Result Value Ref Range   Preg Test, Ur NEGATIVE NEGATIVE  Basic metabolic panel     Status: Abnormal   Collection Time: 11/23/23  7:51 AM  Result Value Ref Range   Sodium 137 135 - 145 mmol/L   Potassium 3.6 3.5 - 5.1 mmol/L   Chloride 100 98 - 111 mmol/L   CO2 28 22 - 32 mmol/L   Glucose, Bld 98 70 - 99 mg/dL   BUN 8 6 - 20 mg/dL   Creatinine, Ser 9.60 0.44 - 1.00 mg/dL   Calcium 8.8 (L) 8.9 - 10.3 mg/dL   GFR, Estimated >45 >40 mL/min   Anion gap 9 5 - 15  CBC     Status: Abnormal   Collection Time: 11/23/23  7:51 AM  Result Value Ref Range   WBC 25.2 (H) 4.0 - 10.5 K/uL   RBC 4.14 3.87 - 5.11 MIL/uL   Hemoglobin 12.9 12.0 - 15.0 g/dL   HCT 98.1 19.1 - 47.8 %   MCV 92.3 80.0 - 100.0 fL   MCH 31.2 26.0 - 34.0 pg   MCHC 33.8 30.0 - 36.0 g/dL   RDW 29.5 62.1 - 30.8 %   Platelets 418 (H) 150 - 400 K/uL   nRBC 0.0 0.0 - 0.2 %  Urinalysis, w/ Reflex to Culture (Infection Suspected) -Urine, Clean Catch     Status: Abnormal   Collection Time: 11/23/23  7:51 AM  Result Value Ref Range   Specimen Source URINE, CLEAN CATCH    Color, Urine YELLOW YELLOW   APPearance CLEAR CLEAR   Specific Gravity, Urine 1.029 1.005 - 1.030   pH 5.5 5.0 - 8.0   Glucose, UA NEGATIVE NEGATIVE mg/dL   Hgb urine dipstick SMALL (A) NEGATIVE   Bilirubin Urine NEGATIVE NEGATIVE   Ketones, ur 40 (A) NEGATIVE mg/dL   Protein, ur 30 (A) NEGATIVE mg/dL   Nitrite NEGATIVE NEGATIVE   Leukocytes,Ua MODERATE (A) NEGATIVE    RBC / HPF 0-5 0 - 5 RBC/hpf   WBC, UA 0-5 0 - 5 WBC/hpf   Bacteria, UA RARE (A) NONE SEEN   Squamous Epithelial / HPF 6-10 0 - 5 /HPF   Mucus PRESENT   Hepatic function panel     Status: None   Collection Time: 11/23/23  7:51 AM  Result Value Ref Range   Total Protein 7.3 6.5 - 8.1 g/dL   Albumin 3.7 3.5 - 5.0 g/dL   AST 22 15 - 41 U/L   ALT 34 0 - 44 U/L   Alkaline Phosphatase 94 38 - 126 U/L   Total Bilirubin 0.4 0.0 - 1.2 mg/dL   Bilirubin, Direct 0.1 0.0 - 0.2 mg/dL   Indirect Bilirubin 0.3 0.3 - 0.9 mg/dL  Wet prep, genital     Status: Abnormal   Collection Time: 11/23/23  9:14 AM   Specimen: Cervical/Vaginal swab  Result Value Ref Range   Yeast Wet Prep HPF POC PRESENT (A) NONE SEEN   Trich, Wet Prep NONE SEEN  NONE SEEN   Clue Cells Wet Prep HPF POC NONE SEEN NONE SEEN   WBC, Wet Prep HPF POC >=10 (A) <10   Sperm NONE SEEN    Basic Metabolic Panel: Recent Labs  Lab 11/19/23 1525 11/23/23 0751  NA 135 137  K 3.2* 3.6  CL 97 100  CO2 31 28  GLUCOSE 105* 98  BUN 13 8  CREATININE 0.60 0.52  CALCIUM 9.3 8.8*   Liver Function Tests: Recent Labs  Lab 11/23/23 0751  AST 22  ALT 34  ALKPHOS 94  BILITOT 0.4  PROT 7.3  ALBUMIN 3.7   No results for input(s): "LIPASE", "AMYLASE" in the last 168 hours. No results for input(s): "AMMONIA" in the last 168 hours. CBC: Recent Labs  Lab 11/19/23 1525 11/23/23 0751  WBC 24.0 Repeated and verified X2.* 25.2*  NEUTROABS 19.8*  --   HGB 13.4 12.9  HCT 39.9 38.2  MCV 93.2 92.3  PLT 278.0 418*   Cardiac Enzymes: No results for input(s): "CKTOTAL", "CKMB", "CKMBINDEX", "TROPONINIHS" in the last 168 hours.  BNP (last 3 results) No results for input(s): "PROBNP" in the last 8760 hours. CBG: No results for input(s): "GLUCAP" in the last 168 hours.  Radiological Exams on Admission:  CT ABDOMEN PELVIS W CONTRAST Result Date: 11/23/2023 CLINICAL DATA:  Left lower quadrant abdominal pain. EXAM: CT ABDOMEN AND PELVIS  WITH CONTRAST TECHNIQUE: Multidetector CT imaging of the abdomen and pelvis was performed using the standard protocol following bolus administration of intravenous contrast. RADIATION DOSE REDUCTION: This exam was performed according to the departmental dose-optimization program which includes automated exposure control, adjustment of the mA and/or kV according to patient size and/or use of iterative reconstruction technique. CONTRAST:  80mL OMNIPAQUE IOHEXOL 300 MG/ML  SOLN COMPARISON:  03/06/2023 FINDINGS: Lower chest: Asymmetric elevation right hemidiaphragm with rightward heart displacement is similar to prior. Hepatobiliary: No suspicious focal abnormality within the liver parenchyma. Small area of low attenuation in the anterior liver, adjacent to the falciform ligament, is in a characteristic location for focal fatty deposition. Cholecystectomy. No intrahepatic or extrahepatic biliary dilation. Pancreas: No focal mass lesion. No dilatation of the main duct. No intraparenchymal cyst. No peripancreatic edema. Spleen: No splenomegaly. No suspicious focal mass lesion. Adrenals/Urinary Tract: No adrenal nodule or mass. Right kidney unremarkable. 10 mm hypodensity lower pole left kidney is similar to prior, compatible with a simple cyst. No followup imaging is recommended. No evidence for hydroureter. The urinary bladder appears normal for the degree of distention. Stomach/Bowel: Stomach is unremarkable. No gastric wall thickening. No evidence of outlet obstruction. Duodenum is normally positioned as is the ligament of Treitz. No small bowel wall thickening. No small bowel dilatation. The terminal ileum is normal. The appendix is normal. Right and transverse colon unremarkable. Diverticular disease is seen in the left colon. There is marked wall thickening in the sigmoid segment with pericolonic edema/inflammation. A 5.2 x 4.6 x 4.7 cm thick walled rim enhancing collection of gas and fluid is identified in the left  pelvis. This is contiguous with the left posterolateral wall of the uterus, the inferior wall of the sigmoid colon, and the left gonadal vein passes into some amorphous soft tissue along the anterolateral margin of the lesion. Tiny gas bubbles to the left of the rectosigmoid junction on image 67/2 are probably within diverticuli but additional tiny gas locules along the low rectum (74/2) may be extraluminal. Vascular/Lymphatic: No abdominal aortic aneurysm. No abdominal aortic atherosclerotic calcification. No gastrohepatic or hepatoduodenal ligament  lymphadenopathy. Small to upper normal retroperitoneal lymph nodes are seen in the region of the aortic bifurcation. Upper normal left pelvic sidewall lymph nodes evident. Reproductive: IUD is visualized in the uterus although position may be low, towards the internal cervical os. Dominant follicle noted right ovary. Left ovary is not discretely visible given the left lower quadrant inflammatory change described above. Other: No substantial intraperitoneal free fluid. Musculoskeletal: No worrisome lytic or sclerotic osseous abnormality. IMPRESSION: 1. 5.2 x 4.6 x 4.7 cm thick walled rim enhancing collection of gas and fluid in the left pelvis is compatible with an abscess. This is contiguous with the left posterolateral wall of the uterus, the inferior wall of the sigmoid colon, and the left gonadal vein. 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 Pelvic ultrasound may prove helpful if the left ovary can be identified as separate from this inflammatory process. 2. Tiny gas locules to the left of the rectosigmoid junction are probably within diverticuli but additional tiny gas locules along the low rectum appear to be  extraluminal. 3. IUD is visualized in the uterus although position may be low, towards the internal cervical os. 4. Asymmetric elevation right hemidiaphragm with rightward heart displacement is similar to prior. Electronically Signed   By: Kennith Center M.D.   On: 11/23/2023 09:02     No intake/output data recorded. No intake/output data recorded.      HCG -ve Today's Vitals   11/23/23 0825 11/23/23 0900 11/23/23 0907 11/23/23 1000  BP:  122/75  128/77  Pulse:  85  88  Resp:      Temp:      TempSrc:      SpO2:  96%  100%  Weight:      Height:      PainSc: 10-Worst pain ever  9     Body mass index is 31.98 kg/m. _____________________________  Plan - will transfer to tele. Full H&P to follow.

## 2023-11-23 NOTE — Consult Note (Signed)
 Reason for Consult/Chief Complaint: diverticulitis with abscess Consultant: Particia Nearing, MD  Faith Evans is an 43 y.o. female.   HPI: 17F with diverticulitis managed with o/p abx by PCP fopr the last two weeks, though symptoms have been rpesent for the last month. Had worsening pain yesterday that prompted her to present to the ED. She had associated nausea without vomiting. No changes to BMs, last BM today and was normal "for me" described as "soft serve pudding". Most recent colonoscopy 2024 prior to a lap chole. Also h/o CS.  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   Fatty liver    GERD (gastroesophageal reflux disease)    History of hemolysis, elevated liver enzymes, and low platelet (HELLP) syndrome 2007   had pre-eclampsia and Toxemia   Hypertension    Pneumonia     Past Surgical History:  Procedure Laterality Date   CESAREAN SECTION  2007   emergent d/t HELLP, Toxemia   CHOLECYSTECTOMY N/A 01/22/2023   Procedure: LAPAROSCOPIC CHOLECYSTECTOMY WITH ICG DYE;  Surgeon: Berna Bue, MD;  Location: [facility];  Service: General;  Laterality: N/A;   NO PAST SURGERIES     WISDOM TOOTH EXTRACTION      Family History  Problem Relation Age of Onset   Cancer Mother    Seizures Father    Cancer Maternal Grandfather    Colon cancer Neg Hx    Rectal cancer Neg Hx    Stomach cancer Neg Hx    Esophageal cancer Neg Hx     Social History:  reports that she has never smoked. She has never used smokeless tobacco. She reports current alcohol use. She reports that she does not use drugs.  Allergies:  Allergies  Allergen Reactions   Amoxicillin Rash   Cinnamon Rash    Medications: I have reviewed the patient's current medications.  Results for orders placed or performed during the hospital encounter of 11/23/23 (from the past 48 hours)   Pregnancy, urine     Status: None   Collection Time: 11/23/23  7:51 AM  Result Value Ref Range   Preg Test, Ur NEGATIVE NEGATIVE    Comment:        THE SENSITIVITY OF THIS METHODOLOGY IS >25 mIU/mL. Performed at [facility], [address], [place], [place] [address]   Basic metabolic panel     Status: Abnormal   Collection Time: 11/23/23  7:51 AM  Result Value Ref Range   Sodium 137 135 - 145 mmol/L   Potassium 3.6 3.5 - 5.1 mmol/L   Chloride 100 98 - 111 mmol/L   CO2 28 22 - 32 mmol/L   Glucose, Bld 98 70 - 99 mg/dL    Comment: Glucose reference range applies only to samples taken after fasting for at least 8 hours.   BUN 8 6 - 20 mg/dL   Creatinine, Ser 9.14 0.44 - 1.00 mg/dL   Calcium 8.8 (L) 8.9 - 10.3 mg/dL   GFR, Estimated >78 >29 mL/min    Comment: (NOTE) Calculated using the CKD-EPI Creatinine Equation (2021)    Anion gap 9 5 - 15    Comment: Performed at [facility], [address], [place], [place] [address]  CBC     Status: Abnormal   Collection Time: 11/23/23  7:51 AM  Result  Value Ref Range   WBC 25.2 (H) 4.0 - 10.5 K/uL   RBC 4.14 3.87 - 5.11 MIL/uL   Hemoglobin 12.9 12.0 - 15.0 g/dL   HCT 16.1 09.6 - 04.5 %   MCV 92.3 80.0 - 100.0 fL   MCH 31.2 26.0 - 34.0 pg   MCHC 33.8 30.0 - 36.0 g/dL   RDW 40.9 81.1 - 91.4 %   Platelets 418 (H) 150 - 400 K/uL   nRBC 0.0 0.0 - 0.2 %    Comment: Performed at [facility], [address] [place], [place] [address]  Urinalysis, w/ Reflex to Culture (Infection Suspected) -Urine, Clean Catch     Status: Abnormal   Collection Time: 11/23/23  7:51 AM  Result Value Ref Range   Specimen Source URINE, CLEAN CATCH    Color, Urine YELLOW YELLOW   APPearance CLEAR CLEAR   Specific Gravity, Urine 1.029 1.005 - 1.030   pH 5.5 5.0 - 8.0   Glucose, UA NEGATIVE NEGATIVE mg/dL   Hgb urine dipstick SMALL (A) NEGATIVE   Bilirubin Urine NEGATIVE NEGATIVE   Ketones, ur 40  (A) NEGATIVE mg/dL   Protein, ur 30 (A) NEGATIVE mg/dL   Nitrite NEGATIVE NEGATIVE   Leukocytes,Ua MODERATE (A) NEGATIVE   RBC / HPF 0-5 0 - 5 RBC/hpf   WBC, UA 0-5 0 - 5 WBC/hpf    Comment:        Reflex urine culture not performed if WBC <=10, OR if Squamous epithelial cells >5. If Squamous epithelial cells >5 suggest recollection.    Bacteria, UA RARE (A) NONE SEEN   Squamous Epithelial / HPF 6-10 0 - 5 /HPF   Mucus PRESENT     Comment: Performed at [facility], [address], [place], [place] [address]  Hepatic function panel     Status: None   Collection Time: 11/23/23  7:51 AM  Result Value Ref Range   Total Protein 7.3 6.5 - 8.1 g/dL   Albumin 3.7 3.5 - 5.0 g/dL   AST 22 15 - 41 U/L   ALT 34 0 - 44 U/L   Alkaline Phosphatase 94 38 - 126 U/L   Total Bilirubin 0.4 0.0 - 1.2 mg/dL   Bilirubin, Direct 0.1 0.0 - 0.2 mg/dL   Indirect Bilirubin 0.3 0.3 - 0.9 mg/dL    Comment: Performed at [facility], [address] [place], [place] [address]  Wet prep, genital     Status: Abnormal   Collection Time: 11/23/23  9:14 AM   Specimen: Cervical/Vaginal swab  Result Value Ref Range   Yeast Wet Prep HPF POC PRESENT (A) NONE SEEN   Trich, Wet Prep NONE SEEN NONE SEEN   Clue Cells Wet Prep HPF POC NONE SEEN NONE SEEN   WBC, Wet Prep HPF POC >=10 (A) <10   Sperm NONE SEEN     Comment: Performed at [facility], [address] [place], [place] [address]  Lactic acid, plasma     Status: None   Collection Time: 11/23/23  1:13 PM  Result Value Ref Range   Lactic Acid, Venous 1.0 0.5 - 1.9 mmol/L    Comment: Performed at [facility], [address] [place], [place] [address]    US PELVIC COMPLETE W TRANSVAGINAL AND TORSION R/O Result Date: 11/23/2023 CLINICAL DATA:  Left lower quadrant pain and nausea. Abnormal CT. Possible abscess. EXAM: TRANSABDOMINAL AND TRANSVAGINAL ULTRASOUND OF PELVIS DOPPLER ULTRASOUND OF  OVARIES TECHNIQUE: Both transabdominal and  transvaginal ultrasound examinations of the pelvis were performed. Transabdominal technique was performed for global imaging of the pelvis including uterus, ovaries, adnexal regions, and pelvic cul-de-sac. It was necessary to proceed with endovaginal exam following the transabdominal exam to visualize the adnexa. Color and duplex Doppler ultrasound was utilized to evaluate blood flow to the ovaries. COMPARISON:  CT of the abdomen and pelvis 11/23/2023. FINDINGS: Uterus Measurements: 10.8 x 4.0 x 4.2 cm = volume: 94.4 mL. No fibroids or other mass visualized. Endometrium Thickness: 6.0 mm.  IUD is in place. Right ovary Measurements: 3.9 x 2.6 x 1.9 cm = volume: 10.2 mL. Normal appearance/no adnexal mass. Left ovary The left ovary is not visualized. Pulsed Doppler evaluation of the right ovary demonstrates normal low-resistance arterial and venous waveforms. Other findings A heterogeneous collection in the left adnexa measures 5.0 x 4.4 x 4.9 cm. The wall is somewhat thickened mild hyperemia surrounds the collection. This is consistent with an abscess as described on the CT scan. IMPRESSION: 1. 5.0 x 4.4 x 4.9 cm heterogeneous collection in the left adnexa is consistent with an abscess as described on the CT scan. 2. The left ovary is not visualized separate from the abscess. Ovarian etiology is not excluded. This still most likely represents a diverticular abscess. 3. Normal appearance of the uterus and right ovary. 4. IUD is in place. Electronically Signed   By: Marin Roberts M.D.   On: 11/23/2023 10:55   CT ABDOMEN PELVIS W CONTRAST Result Date: 11/23/2023 CLINICAL DATA:  Left lower quadrant abdominal pain. EXAM: CT ABDOMEN AND PELVIS WITH CONTRAST TECHNIQUE: Multidetector CT imaging of the abdomen and pelvis was performed using the standard protocol following bolus administration of intravenous contrast. RADIATION DOSE REDUCTION: This exam was performed according  to the departmental dose-optimization program which includes automated exposure control, adjustment of the mA and/or kV according to patient size and/or use of iterative reconstruction technique. CONTRAST:  80mL OMNIPAQUE IOHEXOL 300 MG/ML  SOLN COMPARISON:  03/06/2023 FINDINGS: Lower chest: Asymmetric elevation right hemidiaphragm with rightward heart displacement is similar to prior. Hepatobiliary: No suspicious focal abnormality within the liver parenchyma. Small area of low attenuation in the anterior liver, adjacent to the falciform ligament, is in a characteristic location for focal fatty deposition. Cholecystectomy. No intrahepatic or extrahepatic biliary dilation. Pancreas: No focal mass lesion. No dilatation of the main duct. No intraparenchymal cyst. No peripancreatic edema. Spleen: No splenomegaly. No suspicious focal mass lesion. Adrenals/Urinary Tract: No adrenal nodule or mass. Right kidney unremarkable. 10 mm hypodensity lower pole left kidney is similar to prior, compatible with a simple cyst. No followup imaging is recommended. No evidence for hydroureter. The urinary bladder appears normal for the degree of distention. Stomach/Bowel: Stomach is unremarkable. No gastric wall thickening. No evidence of outlet obstruction. Duodenum is normally positioned as is the ligament of Treitz. No small bowel wall thickening. No small bowel dilatation. The terminal ileum is normal. The appendix is normal. Right and transverse colon unremarkable. Diverticular disease is seen in the left colon. There is marked wall thickening in the sigmoid segment with pericolonic edema/inflammation. A 5.2 x 4.6 x 4.7 cm thick walled rim enhancing collection of gas and fluid is identified in the left pelvis. This is contiguous with the left posterolateral wall of the uterus, the inferior wall of the sigmoid colon, and the left gonadal vein passes into some amorphous soft tissue along the anterolateral margin of the lesion. Tiny  gas bubbles to the left of the rectosigmoid junction on  image 67/2 are probably within diverticuli but additional tiny gas locules along the low rectum (74/2) may be extraluminal. Vascular/Lymphatic: No abdominal aortic aneurysm. No abdominal aortic atherosclerotic calcification. No gastrohepatic or hepatoduodenal ligament lymphadenopathy. Small to upper normal retroperitoneal lymph nodes are seen in the region of the aortic bifurcation. Upper normal left pelvic sidewall lymph nodes evident. Reproductive: IUD is visualized in the uterus although position may be low, towards the internal cervical os. Dominant follicle noted right ovary. Left ovary is not discretely visible given the left lower quadrant inflammatory change described above. Other: No substantial intraperitoneal free fluid. Musculoskeletal: No worrisome lytic or sclerotic osseous abnormality. IMPRESSION: 1. 5.2 x 4.6 x 4.7 cm thick walled rim enhancing collection of gas and fluid in the left pelvis is compatible with an abscess. This is contiguous with the left posterolateral wall of the uterus, the inferior wall of the sigmoid colon, and the left gonadal vein. 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 Pelvic ultrasound may prove helpful if the left ovary can be identified as separate from this inflammatory process. 2. Tiny gas locules to the left of the rectosigmoid junction are probably within diverticuli but additional tiny gas locules along the low rectum appear to be extraluminal. 3. IUD is visualized in the uterus although position may be low, towards the internal cervical os. 4. Asymmetric elevation right hemidiaphragm with rightward heart displacement is similar to prior. Electronically Signed   By:  Kennith Center M.D.   On: 11/23/2023 09:02    ROS 10 point review of systems is negative except as listed above in HPI.   Physical Exam Blood pressure 114/74, pulse 89, temperature (!) 101.1 F (38.4 C), temperature source Oral, resp. rate 18, height 4\' 10"  (1.473 m), weight 69.4 kg, SpO2 100%. Constitutional: well-developed, well-nourished HEENT: pupils equal, round, reactive to light, 2mm b/l, moist conjunctiva, external inspection of ears and nose normal, hearing intact Oropharynx: normal oropharyngeal mucosa, normal dentition Neck: no thyromegaly, trachea midline, no midline cervical tenderness to palpation Chest: breath sounds equal bilaterally, normal respiratory effort, no midline or lateral chest wall tenderness to palpation/deformity Abdomen: soft, central pelvic TTP, no bruising, no hepatosplenomegaly Skin: warm, dry, no rashes Psych: normal memory, normal mood/affect     Assessment/Plan: Diverticulitis - continue abx, IR drain placement scheduled for 4/7. Discussed nonoperative management and the possibility of operative management. All questions answered. Spouse and parents at [facility], all their questions answered as well  FEN - CLD, NPO at [facility] DVT - SCDs, LMWH Dispo - med-surg    Diamantina Monks, MD General and Trauma Surgery [facility]

## 2023-11-24 ENCOUNTER — Other Ambulatory Visit

## 2023-11-24 ENCOUNTER — Inpatient Hospital Stay: Admission: RE | Admit: 2023-11-24 | Source: Ambulatory Visit

## 2023-11-24 ENCOUNTER — Inpatient Hospital Stay (HOSPITAL_COMMUNITY)

## 2023-11-24 DIAGNOSIS — K5720 Diverticulitis of large intestine with perforation and abscess without bleeding: Secondary | ICD-10-CM | POA: Diagnosis not present

## 2023-11-24 LAB — GC/CHLAMYDIA PROBE AMP (~~LOC~~) NOT AT ARMC
Chlamydia: NEGATIVE
Comment: NEGATIVE
Comment: NORMAL
Neisseria Gonorrhea: NEGATIVE

## 2023-11-24 LAB — CBC
HCT: 35.9 % — ABNORMAL LOW (ref 36.0–46.0)
Hemoglobin: 12 g/dL (ref 12.0–15.0)
MCH: 31.2 pg (ref 26.0–34.0)
MCHC: 33.4 g/dL (ref 30.0–36.0)
MCV: 93.2 fL (ref 80.0–100.0)
Platelets: 370 10*3/uL (ref 150–400)
RBC: 3.85 MIL/uL — ABNORMAL LOW (ref 3.87–5.11)
RDW: 14.2 % (ref 11.5–15.5)
WBC: 20.7 10*3/uL — ABNORMAL HIGH (ref 4.0–10.5)
nRBC: 0 % (ref 0.0–0.2)

## 2023-11-24 LAB — COMPREHENSIVE METABOLIC PANEL WITH GFR
ALT: 28 U/L (ref 0–44)
AST: 14 U/L — ABNORMAL LOW (ref 15–41)
Albumin: 2.4 g/dL — ABNORMAL LOW (ref 3.5–5.0)
Alkaline Phosphatase: 71 U/L (ref 38–126)
Anion gap: 9 (ref 5–15)
BUN: 5 mg/dL — ABNORMAL LOW (ref 6–20)
CO2: 25 mmol/L (ref 22–32)
Calcium: 7.8 mg/dL — ABNORMAL LOW (ref 8.9–10.3)
Chloride: 103 mmol/L (ref 98–111)
Creatinine, Ser: 0.56 mg/dL (ref 0.44–1.00)
GFR, Estimated: >60 mL/min (ref >60)
Glucose, Bld: 99 mg/dL (ref 70–99)
Potassium: 3.2 mmol/L — ABNORMAL LOW (ref 3.5–5.1)
Sodium: 137 mmol/L (ref 135–145)
Total Bilirubin: 0.5 mg/dL (ref 0.0–1.2)
Total Protein: 5.9 g/dL — ABNORMAL LOW (ref 6.5–8.1)

## 2023-11-24 LAB — PROTIME-INR
INR: 1.1 (ref 0.8–1.2)
Prothrombin Time: 14.8 s (ref 11.4–15.2)

## 2023-11-24 LAB — MAGNESIUM: Magnesium: 1.6 mg/dL — ABNORMAL LOW (ref 1.7–2.4)

## 2023-11-24 LAB — LACTIC ACID, PLASMA: Lactic Acid, Venous: 0.9 mmol/L (ref 0.5–1.9)

## 2023-11-24 MED ORDER — LIDOCAINE HCL 1 % IJ SOLN
10.0000 mL | Freq: Once | INTRAMUSCULAR | Status: AC
  Administered 2023-11-24: 10 mL via INTRADERMAL

## 2023-11-24 MED ORDER — FENTANYL CITRATE (PF) 100 MCG/2ML IJ SOLN
INTRAMUSCULAR | Status: AC
  Filled 2023-11-24: qty 4

## 2023-11-24 MED ORDER — DOCUSATE SODIUM 100 MG PO CAPS
100.0000 mg | ORAL_CAPSULE | Freq: Two times a day (BID) | ORAL | Status: DC
  Administered 2023-11-24 (×2): 100 mg via ORAL
  Filled 2023-11-24 (×4): qty 1

## 2023-11-24 MED ORDER — MIDAZOLAM HCL 2 MG/2ML IJ SOLN
INTRAMUSCULAR | Status: AC | PRN
  Administered 2023-11-24 (×2): 1 mg via INTRAVENOUS
  Administered 2023-11-24: .5 mg via INTRAVENOUS
  Administered 2023-11-24: 1 mg via INTRAVENOUS

## 2023-11-24 MED ORDER — MIDAZOLAM HCL 2 MG/2ML IJ SOLN
INTRAMUSCULAR | Status: AC
  Filled 2023-11-24: qty 4

## 2023-11-24 MED ORDER — FENTANYL CITRATE (PF) 100 MCG/2ML IJ SOLN
INTRAMUSCULAR | Status: AC | PRN
  Administered 2023-11-24: 50 ug via INTRAVENOUS
  Administered 2023-11-24: 25 ug via INTRAVENOUS
  Administered 2023-11-24 (×2): 50 ug via INTRAVENOUS

## 2023-11-24 MED ORDER — POTASSIUM CHLORIDE CRYS ER 20 MEQ PO TBCR
40.0000 meq | EXTENDED_RELEASE_TABLET | ORAL | Status: AC
  Administered 2023-11-24 (×2): 40 meq via ORAL
  Filled 2023-11-24 (×2): qty 2

## 2023-11-24 MED ORDER — ONDANSETRON HCL 4 MG/2ML IJ SOLN
INTRAMUSCULAR | Status: AC
  Filled 2023-11-24: qty 2

## 2023-11-24 NOTE — Progress Notes (Signed)
 Subjective: CC: Patient reports this is her 2nd episode of diverticulitis.  Had episode 1 mo ago tx w/ outpt abx and resolved Was on 4d cipro/flagyl prior to presentation Continue LLQ abdominal pain that is stable and rated as a 5/10. No n/v. BM yesterday that was non-bloody.   Tmax 101.1 over the last 24 hours. Afebrile this am. No tachycardia or hypotension. Lactic wnl on admit. WBC 20.7 (25.2).   Fhx of colon ca in her mother (50's).  Last colonoscopy 2024 - reviewed.  Her husband is at bedside.   Objective: Vital signs in last 24 hours: Temp:  [99.3 F (37.4 C)-101.1 F (38.4 C)] 99.9 F (37.7 C) (04/07 0733) Pulse Rate:  [74-89] 74 (04/07 0733) Resp:  [16-18] 16 (04/07 0733) BP: (108-128)/(70-77) 122/70 (04/07 0733) SpO2:  [96 %-100 %] 97 % (04/07 0733)    Intake/Output from previous day: 04/06 0701 - 04/07 0700 In: 1095.8 [I.V.:832.1; IV Piggyback:263.7] Out: -  Intake/Output this shift: No intake/output data recorded.  PE: Gen:  Alert, NAD, pleasant Abd: Soft, ND, LLQ ttp, +BS  Lab Results:  Recent Labs    11/23/23 0751 11/24/23 0622  WBC 25.2* 20.7*  HGB 12.9 12.0  HCT 38.2 35.9*  PLT 418* 370   BMET Recent Labs    11/23/23 0751 11/24/23 0622  NA 137 137  K 3.6 3.2*  CL 100 103  CO2 28 25  GLUCOSE 98 99  BUN 8 5*  CREATININE 0.52 0.56  CALCIUM 8.8* 7.8*   PT/INR Recent Labs    11/24/23 0622  LABPROT 14.8  INR 1.1   CMP     Component Value Date/Time   NA 137 11/24/2023 0622   K 3.2 (L) 11/24/2023 0622   CL 103 11/24/2023 0622   CO2 25 11/24/2023 0622   GLUCOSE 99 11/24/2023 0622   BUN 5 (L) 11/24/2023 0622   CREATININE 0.56 11/24/2023 0622   CALCIUM 7.8 (L) 11/24/2023 0622   PROT 5.9 (L) 11/24/2023 0622   ALBUMIN 2.4 (L) 11/24/2023 0622   AST 14 (L) 11/24/2023 0622   ALT 28 11/24/2023 0622   ALKPHOS 71 11/24/2023 0622   BILITOT 0.5 11/24/2023 0622   GFRNONAA >60 11/24/2023 0622   GFRAA >60 01/19/2017 0307   Lipase      Component Value Date/Time   LIPASE 12.0 07/26/2022 0939    Studies/Results: US PELVIC COMPLETE W TRANSVAGINAL AND TORSION R/O Result Date: 11/23/2023 CLINICAL DATA:  Left lower quadrant pain and nausea. Abnormal CT. Possible abscess. EXAM: TRANSABDOMINAL AND TRANSVAGINAL ULTRASOUND OF PELVIS DOPPLER ULTRASOUND OF OVARIES TECHNIQUE: Both transabdominal and transvaginal ultrasound examinations of the pelvis were performed. Transabdominal technique was performed for global imaging of the pelvis including uterus, ovaries, adnexal regions, and pelvic cul-de-sac. It was necessary to proceed with endovaginal exam following the transabdominal exam to visualize the adnexa. Color and duplex Doppler ultrasound was utilized to evaluate blood flow to the ovaries. COMPARISON:  CT of the abdomen and pelvis 11/23/2023. FINDINGS: Uterus Measurements: 10.8 x 4.0 x 4.2 cm = volume: 94.4 mL. No fibroids or other mass visualized. Endometrium Thickness: 6.0 mm.  IUD is in place. Right ovary Measurements: 3.9 x 2.6 x 1.9 cm = volume: 10.2 mL. Normal appearance/no adnexal mass. Left ovary The left ovary is not visualized. Pulsed Doppler evaluation of the right ovary demonstrates normal low-resistance arterial and venous waveforms. Other findings A heterogeneous collection in the left adnexa measures 5.0 x 4.4 x 4.9 cm. The  wall is somewhat thickened mild hyperemia surrounds the collection. This is consistent with an abscess as described on the CT scan. IMPRESSION: 1. 5.0 x 4.4 x 4.9 cm heterogeneous collection in the left adnexa is consistent with an abscess as described on the CT scan. 2. The left ovary is not visualized separate from the abscess. Ovarian etiology is not excluded. This still most likely represents a diverticular abscess. 3. Normal appearance of the uterus and right ovary. 4. IUD is in place. Electronically Signed   By: Marin Roberts M.D.   On: 11/23/2023 10:55   CT ABDOMEN PELVIS W CONTRAST Result  Date: 11/23/2023 CLINICAL DATA:  Left lower quadrant abdominal pain. EXAM: CT ABDOMEN AND PELVIS WITH CONTRAST TECHNIQUE: Multidetector CT imaging of the abdomen and pelvis was performed using the standard protocol following bolus administration of intravenous contrast. RADIATION DOSE REDUCTION: This exam was performed according to the departmental dose-optimization program which includes automated exposure control, adjustment of the mA and/or kV according to patient size and/or use of iterative reconstruction technique. CONTRAST:  80mL OMNIPAQUE IOHEXOL 300 MG/ML  SOLN COMPARISON:  03/06/2023 FINDINGS: Lower chest: Asymmetric elevation right hemidiaphragm with rightward heart displacement is similar to prior. Hepatobiliary: No suspicious focal abnormality within the liver parenchyma. Small area of low attenuation in the anterior liver, adjacent to the falciform ligament, is in a characteristic location for focal fatty deposition. Cholecystectomy. No intrahepatic or extrahepatic biliary dilation. Pancreas: No focal mass lesion. No dilatation of the main duct. No intraparenchymal cyst. No peripancreatic edema. Spleen: No splenomegaly. No suspicious focal mass lesion. Adrenals/Urinary Tract: No adrenal nodule or mass. Right kidney unremarkable. 10 mm hypodensity lower pole left kidney is similar to prior, compatible with a simple cyst. No followup imaging is recommended. No evidence for hydroureter. The urinary bladder appears normal for the degree of distention. Stomach/Bowel: Stomach is unremarkable. No gastric wall thickening. No evidence of outlet obstruction. Duodenum is normally positioned as is the ligament of Treitz. No small bowel wall thickening. No small bowel dilatation. The terminal ileum is normal. The appendix is normal. Right and transverse colon unremarkable. Diverticular disease is seen in the left colon. There is marked wall thickening in the sigmoid segment with pericolonic edema/inflammation. A 5.2  x 4.6 x 4.7 cm thick walled rim enhancing collection of gas and fluid is identified in the left pelvis. This is contiguous with the left posterolateral wall of the uterus, the inferior wall of the sigmoid colon, and the left gonadal vein passes into some amorphous soft tissue along the anterolateral margin of the lesion. Tiny gas bubbles to the left of the rectosigmoid junction on image 67/2 are probably within diverticuli but additional tiny gas locules along the low rectum (74/2) may be extraluminal. Vascular/Lymphatic: No abdominal aortic aneurysm. No abdominal aortic atherosclerotic calcification. No gastrohepatic or hepatoduodenal ligament lymphadenopathy. Small to upper normal retroperitoneal lymph nodes are seen in the region of the aortic bifurcation. Upper normal left pelvic sidewall lymph nodes evident. Reproductive: IUD is visualized in the uterus although position may be low, towards the internal cervical os. Dominant follicle noted right ovary. Left ovary is not discretely visible given the left lower quadrant inflammatory change described above. Other: No substantial intraperitoneal free fluid. Musculoskeletal: No worrisome lytic or sclerotic osseous abnormality. IMPRESSION: 1. 5.2 x 4.6 x 4.7 cm thick walled rim enhancing collection of gas and fluid in the left pelvis is compatible with an abscess. This is contiguous with the left posterolateral wall of the uterus, the inferior  wall of the sigmoid colon, and the left gonadal vein. 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 Pelvic ultrasound may prove helpful if the left ovary can be identified as separate from this inflammatory process. 2. Tiny gas locules to the left of the rectosigmoid junction are  probably within diverticuli but additional tiny gas locules along the low rectum appear to be extraluminal. 3. IUD is visualized in the uterus although position may be low, towards the internal cervical os. 4. Asymmetric elevation right hemidiaphragm with rightward heart displacement is similar to prior. Electronically Signed   By: Kennith Center M.D.   On: 11/23/2023 09:02    Anti-infectives: Anti-infectives (From admission, onward)    Start     Dose/Rate Route Frequency Ordered Stop   11/23/23 2230  metroNIDAZOLE (FLAGYL) IVPB 500 mg        500 mg 100 mL/hr over 60 Minutes Intravenous Every 12 hours 11/23/23 1339     11/23/23 1730  ceFEPIme (MAXIPIME) 2 g in sodium chloride 0.9 % 100 mL IVPB        2 g 200 mL/hr over 30 Minutes Intravenous Every 8 hours 11/23/23 1353     11/23/23 1030  fluconazole (DIFLUCAN) tablet 150 mg        150 mg Oral  Once 11/23/23 1024 11/23/23 1031   11/23/23 0915  ceFEPIme (MAXIPIME) 2 g in sodium chloride 0.9 % 100 mL IVPB       Placed in "And" Linked Group   2 g 200 mL/hr over 30 Minutes Intravenous  Once 11/23/23 0910 11/23/23 0951   11/23/23 0915  metroNIDAZOLE (FLAGYL) IVPB 500 mg       Placed in "And" Linked Group   500 mg 100 mL/hr over 60 Minutes Intravenous  Once 11/23/23 0910 11/23/23 1132        Assessment/Plan Colonic Diverticulitis w/ abscess - No current indication for emergency surgery - Cont IV antibiotics - Keep NPO - IR consult for drainage - Hopefully patient will improve with conservative treatment.  If patient fails to improve they may require repeating imaging, drain placement, or surgical intervention resulting in a colectomy/colostomy.  This was discussed with the patient. - We will follow with you  FEN - NPO, IVF per primary  VTE - SCDs, sqh  ID - Cefepime/Flagyl  I reviewed nursing notes, last 24 h vitals and pain scores, last 48 h intake and output, last 24 h labs and trends, and last 24 h imaging results.   LOS: 1 day     Jacinto Halim, [facility] 11/24/2023, 8:45 AM Please see Amion for pager number during day hours 7:00am-4:30pm

## 2023-11-24 NOTE — Procedures (Signed)
 Interventional Radiology Procedure Note  Procedure: CT Guided Drainage of diverticular abscess  Complications: None  Estimated Blood Loss: < 10 mL  Findings: 10 Fr drain placed in pelvic diverticular abscess with return of purulent fluid. 20 mL fluid sample sent for culture analysis. Drain attached to suction bulb drainage.  Will follow.  Jodi Marble. Fredia Sorrow, M.D Pager:  [phone]

## 2023-11-24 NOTE — Progress Notes (Signed)
 Pt hr 106, Temp 100.1 she is asymptomatic just reports feeling warm. PRN tylenol given MD made aware

## 2023-11-24 NOTE — Progress Notes (Signed)
 PROGRESS NOTE    Faith Evans  ZOX:[id number] DOB: [birth year] DOA: 11/23/2023 PCP: Elenore Paddy, NP   Brief Narrative:  Faith Evans is a 43 y.o. female with past medical history of anxiety, obesity, GERD, congenital malformation of the lung, GERD, history of help syndrome presented to ED with complaints of left-sided abdominal pain that is been going on for about 4 weeks, patient was seen by PCP and started on antibiotics for diverticulitis 2 weeks ago which she has been taking but due to worsening pain, she came to the ED.  CT abdomen showed diverticulitis with abscess, admitted to hospital service, started on antibiotics, seen by general surgery.  Assessment & Plan:   Active Problems:   Colonic diverticular abscess  Sepsis secondary to acute diverticulitis with diverticular abscess: Diverticulitis and abscess both present upon admission but sepsis developed after admission.  Leukocytosis 25 but she did not meet criteria for sepsis at the time she was admitted however after admission she developed fever of 101.1 and with leukocytosis, she did meet criteria of sepsis after the admission, likely her lactic acid was normal.  She is afebrile since 4 PM on 11/23/2023.  Will recheck lactic acid today.  Her leukocytosis is improving.  Seen by general surgery and placed intra-abdominal drain by IR today.  Remains on broad spec antibiotics.  Follow culture and tailor antibiotics accordingly.  Appreciate both IR and general surgery.  Class II obesity: Weight loss and diet modification counseled.  DVT prophylaxis: heparin injection 5,000 Units Start: 11/23/23 1400   Code Status: Full Code  Family Communication: Husband and another family member present at bedside.  Plan of care discussed with patient in length and he/she verbalized understanding and agreed with it.  Status is: Inpatient Remains inpatient appropriate because: Patient has abdominal drain, needs antibiotics and daily  monitoring.   Estimated body mass index is 31.98 kg/m as calculated from the following:   Height as of this encounter: 4\' 10"  (1.473 m).   Weight as of this encounter: 69.4 kg.    Nutritional Assessment: Body mass index is 31.98 kg/m.Marland Kitchen Seen by dietician.  I agree with the assessment and plan as outlined below: Nutrition Status:        . Skin Assessment: I have examined the patient's skin and I agree with the wound assessment as performed by the wound care RN as outlined below:    Consultants:  General surgery  Procedures:  As above  Antimicrobials:  Anti-infectives (From admission, onward)    Start     Dose/Rate Route Frequency Ordered Stop   11/23/23 2230  metroNIDAZOLE (FLAGYL) IVPB 500 mg        500 mg 100 mL/hr over 60 Minutes Intravenous Every 12 hours 11/23/23 1339     11/23/23 1730  ceFEPIme (MAXIPIME) 2 g in sodium chloride 0.9 % 100 mL IVPB        2 g 200 mL/hr over 30 Minutes Intravenous Every 8 hours 11/23/23 1353     11/23/23 1030  fluconazole (DIFLUCAN) tablet 150 mg        150 mg Oral  Once 11/23/23 1024 11/23/23 1031   11/23/23 0915  ceFEPIme (MAXIPIME) 2 g in sodium chloride 0.9 % 100 mL IVPB       Placed in "And" Linked Group   2 g 200 mL/hr over 30 Minutes Intravenous  Once 11/23/23 0910 11/23/23 0951   11/23/23 0915  metroNIDAZOLE (FLAGYL) IVPB 500 mg       Placed  in "And" Linked Group   500 mg 100 mL/hr over 60 Minutes Intravenous  Once 11/23/23 0910 11/23/23 1132         Subjective: Patient seen and examined after she returned from IR.  Complains of severe left lower quadrant pain.  No other complaint.  She  denied having any history of hypertension and has been confirmed. Objective: Vitals:   11/24/23 1040 11/24/23 1045 11/24/23 1050 11/24/23 1128  BP: 122/78 130/75 116/72 124/77  Pulse: 80 91 85 85  Resp: (!) 22 (!) 22 (!) 22 18  Temp:    98.6 F (37 C)  TempSrc:    Oral  SpO2: 99% 99% 99% 95%  Weight:      Height:         Intake/Output Summary (Last 24 hours) at 11/24/2023 1206 Last data filed at 11/24/2023 1134 Gross per 24 hour  Intake 1095.82 ml  Output 55 ml  Net 1040.82 ml   Filed Weights   11/23/23 0742  Weight: 69.4 kg    Examination:  General exam: Appears calm and comfortable, obese Respiratory system: Clear to auscultation. Respiratory effort normal. Cardiovascular system: S1 & S2 heard, RRR. No JVD, murmurs, rubs, gallops or clicks. No pedal edema. Gastrointestinal system: Abdomen is nondistended, soft and generalized tenderness, more pronounced at the left lower quadrant. No organomegaly or masses felt. Normal bowel sounds heard. Central nervous system: Alert and oriented. No focal neurological deficits. Extremities: Symmetric 5 x 5 power. Skin: No rashes, lesions or ulcers Psychiatry: Judgement and insight appear normal. Mood & affect appropriate.    Data Reviewed: I have personally reviewed following labs and imaging studies  CBC: Recent Labs  Lab 11/19/23 1525 11/23/23 0751 11/24/23 0622  WBC 24.0 Repeated and verified X2.* 25.2* 20.7*  NEUTROABS 19.8*  --   --   HGB 13.4 12.9 12.0  HCT 39.9 38.2 35.9*  MCV 93.2 92.3 93.2  PLT 278.0 418* 370   Basic Metabolic Panel: Recent Labs  Lab 11/19/23 1525 11/23/23 0751 11/24/23 0622  NA 135 137 137  K 3.2* 3.6 3.2*  CL 97 100 103  CO2 31 28 25   GLUCOSE 105* 98 99  BUN 13 8 5*  CREATININE 0.60 0.52 0.56  CALCIUM 9.3 8.8* 7.8*  MG  --   --  1.6*   GFR: Estimated Creatinine Clearance: 76.4 mL/min (by C-G formula based on SCr of 0.56 mg/dL). Liver Function Tests: Recent Labs  Lab 11/23/23 0751 11/24/23 0622  AST 22 14*  ALT 34 28  ALKPHOS 94 71  BILITOT 0.4 0.5  PROT 7.3 5.9*  ALBUMIN 3.7 2.4*   No results for input(s): "LIPASE", "AMYLASE" in the last 168 hours. No results for input(s): "AMMONIA" in the last 168 hours. Coagulation Profile: Recent Labs  Lab 11/24/23 0622  INR 1.1   Cardiac Enzymes: No  results for input(s): "CKTOTAL", "CKMB", "CKMBINDEX", "TROPONINI" in the last 168 hours. BNP (last 3 results) No results for input(s): "PROBNP" in the last 8760 hours. HbA1C: Recent Labs    11/23/23 2234  HGBA1C 5.0   CBG: No results for input(s): "GLUCAP" in the last 168 hours. Lipid Profile: No results for input(s): "CHOL", "HDL", "LDLCALC", "TRIG", "CHOLHDL", "LDLDIRECT" in the last 72 hours. Thyroid Function Tests: No results for input(s): "TSH", "T4TOTAL", "FREET4", "T3FREE", "THYROIDAB" in the last 72 hours. Anemia Panel: No results for input(s): "VITAMINB12", "FOLATE", "FERRITIN", "TIBC", "IRON", "RETICCTPCT" in the last 72 hours. Sepsis Labs: Recent Labs  Lab 11/23/23 1313 11/23/23 1559  LATICACIDVEN 1.0 0.9    Recent Results (from the past 240 hours)  Urine Culture     Status: None   Collection Time: 11/19/23  3:25 PM   Specimen: Urine  Result Value Ref Range Status   Source: URINE  Final   Status: FINAL  Final   Result: No Growth  Final  Wet prep, genital     Status: Abnormal   Collection Time: 11/23/23  9:14 AM   Specimen: Cervical/Vaginal swab  Result Value Ref Range Status   Yeast Wet Prep HPF POC PRESENT (A) NONE SEEN Final   Trich, Wet Prep NONE SEEN NONE SEEN Final   Clue Cells Wet Prep HPF POC NONE SEEN NONE SEEN Final   WBC, Wet Prep HPF POC >=10 (A) <10 Final   Sperm NONE SEEN  Final    Comment: Performed at [facility], [address], [place], [place] [address]     Radiology Studies: US PELVIC COMPLETE W TRANSVAGINAL AND TORSION R/O Result Date: 11/23/2023 CLINICAL DATA:  Left lower quadrant pain and nausea. Abnormal CT. Possible abscess. EXAM: TRANSABDOMINAL AND TRANSVAGINAL ULTRASOUND OF PELVIS DOPPLER ULTRASOUND OF OVARIES TECHNIQUE: Both transabdominal and transvaginal ultrasound examinations of the pelvis were performed. Transabdominal technique was performed for global imaging of the pelvis including uterus, ovaries,  adnexal regions, and pelvic cul-de-sac. It was necessary to proceed with endovaginal exam following the transabdominal exam to visualize the adnexa. Color and duplex Doppler ultrasound was utilized to evaluate blood flow to the ovaries. COMPARISON:  CT of the abdomen and pelvis 11/23/2023. FINDINGS: Uterus Measurements: 10.8 x 4.0 x 4.2 cm = volume: 94.4 mL. No fibroids or other mass visualized. Endometrium Thickness: 6.0 mm.  IUD is in place. Right ovary Measurements: 3.9 x 2.6 x 1.9 cm = volume: 10.2 mL. Normal appearance/no adnexal mass. Left ovary The left ovary is not visualized. Pulsed Doppler evaluation of the right ovary demonstrates normal low-resistance arterial and venous waveforms. Other findings A heterogeneous collection in the left adnexa measures 5.0 x 4.4 x 4.9 cm. The wall is somewhat thickened mild hyperemia surrounds the collection. This is consistent with an abscess as described on the CT scan. IMPRESSION: 1. 5.0 x 4.4 x 4.9 cm heterogeneous collection in the left adnexa is consistent with an abscess as described on the CT scan. 2. The left ovary is not visualized separate from the abscess. Ovarian etiology is not excluded. This still most likely represents a diverticular abscess. 3. Normal appearance of the uterus and right ovary. 4. IUD is in place. Electronically Signed   By: Marin Roberts M.D.   On: 11/23/2023 10:55   CT ABDOMEN PELVIS W CONTRAST Result Date: 11/23/2023 CLINICAL DATA:  Left lower quadrant abdominal pain. EXAM: CT ABDOMEN AND PELVIS WITH CONTRAST TECHNIQUE: Multidetector CT imaging of the abdomen and pelvis was performed using the standard protocol following bolus administration of intravenous contrast. RADIATION DOSE REDUCTION: This exam was performed according to the departmental dose-optimization program which includes automated exposure control, adjustment of the mA and/or kV according to patient size and/or use of iterative reconstruction technique. CONTRAST:   80mL OMNIPAQUE IOHEXOL 300 MG/ML  SOLN COMPARISON:  03/06/2023 FINDINGS: Lower chest: Asymmetric elevation right hemidiaphragm with rightward heart displacement is similar to prior. Hepatobiliary: No suspicious focal abnormality within the liver parenchyma. Small area of low attenuation in the anterior liver, adjacent to the falciform ligament, is in a characteristic location for focal fatty deposition. Cholecystectomy. No intrahepatic or extrahepatic biliary dilation. Pancreas: No focal  mass lesion. No dilatation of the main duct. No intraparenchymal cyst. No peripancreatic edema. Spleen: No splenomegaly. No suspicious focal mass lesion. Adrenals/Urinary Tract: No adrenal nodule or mass. Right kidney unremarkable. 10 mm hypodensity lower pole left kidney is similar to prior, compatible with a simple cyst. No followup imaging is recommended. No evidence for hydroureter. The urinary bladder appears normal for the degree of distention. Stomach/Bowel: Stomach is unremarkable. No gastric wall thickening. No evidence of outlet obstruction. Duodenum is normally positioned as is the ligament of Treitz. No small bowel wall thickening. No small bowel dilatation. The terminal ileum is normal. The appendix is normal. Right and transverse colon unremarkable. Diverticular disease is seen in the left colon. There is marked wall thickening in the sigmoid segment with pericolonic edema/inflammation. A 5.2 x 4.6 x 4.7 cm thick walled rim enhancing collection of gas and fluid is identified in the left pelvis. This is contiguous with the left posterolateral wall of the uterus, the inferior wall of the sigmoid colon, and the left gonadal vein passes into some amorphous soft tissue along the anterolateral margin of the lesion. Tiny gas bubbles to the left of the rectosigmoid junction on image 67/2 are probably within diverticuli but additional tiny gas locules along the low rectum (74/2) may be extraluminal. Vascular/Lymphatic: No  abdominal aortic aneurysm. No abdominal aortic atherosclerotic calcification. No gastrohepatic or hepatoduodenal ligament lymphadenopathy. Small to upper normal retroperitoneal lymph nodes are seen in the region of the aortic bifurcation. Upper normal left pelvic sidewall lymph nodes evident. Reproductive: IUD is visualized in the uterus although position may be low, towards the internal cervical os. Dominant follicle noted right ovary. Left ovary is not discretely visible given the left lower quadrant inflammatory change described above. Other: No substantial intraperitoneal free fluid. Musculoskeletal: No worrisome lytic or sclerotic osseous abnormality. IMPRESSION: 1. 5.2 x 4.6 x 4.7 cm thick walled rim enhancing collection of gas and fluid in the left pelvis is compatible with an abscess. This is contiguous with the left posterolateral wall of the uterus, the inferior wall of the sigmoid colon, and the left gonadal vein. 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 Pelvic ultrasound may prove helpful if the left ovary can be identified as separate from this inflammatory process. 2. Tiny gas locules to the left of the rectosigmoid junction are probably within diverticuli but additional tiny gas locules along the low rectum appear to be extraluminal. 3. IUD is visualized in the uterus although position may be low, towards the internal cervical os. 4. Asymmetric elevation right hemidiaphragm with rightward heart displacement is similar to prior. Electronically Signed   By: Kennith Center M.D.   On: 11/23/2023 09:02    Scheduled Meds:  feeding supplement  1 Container Oral TID BM   heparin  5,000 Units Subcutaneous Q8H   lidocaine  10 mL Intradermal Once   potassium  chloride  40 mEq Oral Q4H   sodium chloride flush  3 mL Intravenous Q12H   Continuous Infusions:  sodium chloride Stopped (11/24/23 0340)   ceFEPime (MAXIPIME) IV 2 g (11/24/23 0955)   metronidazole 500 mg (11/24/23 1156)     LOS: 1 day   Hughie Closs, MD [facility]  11/24/2023, 12:06 PM   *Please note that this is a verbal dictation therefore  any spelling or grammatical errors are due to the "Dragon [employer] One" system interpretation.  Please page via Amion and do not message via secure chat for urgent patient care matters. Secure chat can be used for non urgent patient care matters.  How to contact the [facility] Attending or Consulting provider 7A - 7P or covering provider during after hours 7P -7A, for this patient?  Check the care team in [facility] and look for a) attending/consulting [facility] provider listed and b) the [facility] team listed. Page or secure chat 7A-7P. Log into [email or web address] and use [facility]'s universal password to access. If you do not have the password, please contact the hospital operator. Locate the [facility] provider you are looking for under [facility] and page to a number that you can be directly reached. If you still have difficulty reaching the provider, please page the [facility] (Director on Call) for the Hospitalists listed on amion for assistance.

## 2023-11-25 DIAGNOSIS — K5720 Diverticulitis of large intestine with perforation and abscess without bleeding: Secondary | ICD-10-CM | POA: Diagnosis not present

## 2023-11-25 LAB — COMPREHENSIVE METABOLIC PANEL WITH GFR
ALT: 20 U/L (ref 0–44)
AST: 11 U/L — ABNORMAL LOW (ref 15–41)
Albumin: 2.2 g/dL — ABNORMAL LOW (ref 3.5–5.0)
Alkaline Phosphatase: 60 U/L (ref 38–126)
Anion gap: 9 (ref 5–15)
BUN: 5 mg/dL — ABNORMAL LOW (ref 6–20)
CO2: 25 mmol/L (ref 22–32)
Calcium: 7.8 mg/dL — ABNORMAL LOW (ref 8.9–10.3)
Chloride: 103 mmol/L (ref 98–111)
Creatinine, Ser: 0.54 mg/dL (ref 0.44–1.00)
GFR, Estimated: >60 mL/min (ref >60)
Glucose, Bld: 97 mg/dL (ref 70–99)
Potassium: 3.6 mmol/L (ref 3.5–5.1)
Sodium: 137 mmol/L (ref 135–145)
Total Bilirubin: 0.5 mg/dL (ref 0.0–1.2)
Total Protein: 5.6 g/dL — ABNORMAL LOW (ref 6.5–8.1)

## 2023-11-25 LAB — URINE CYTOLOGY ANCILLARY ONLY
Chlamydia: NEGATIVE
Comment: NEGATIVE
Comment: NORMAL
Neisseria Gonorrhea: NEGATIVE

## 2023-11-25 LAB — CBC WITH DIFFERENTIAL/PLATELET
Abs Immature Granulocytes: 0.52 10*3/uL — ABNORMAL HIGH (ref 0.00–0.07)
Basophils Absolute: 0.1 10*3/uL (ref 0.0–0.1)
Basophils Relative: 1 %
Eosinophils Absolute: 0.1 10*3/uL (ref 0.0–0.5)
Eosinophils Relative: 0 %
HCT: 34.2 % — ABNORMAL LOW (ref 36.0–46.0)
Hemoglobin: 11.5 g/dL — ABNORMAL LOW (ref 12.0–15.0)
Immature Granulocytes: 3 %
Lymphocytes Relative: 16 %
Lymphs Abs: 2.6 10*3/uL (ref 0.7–4.0)
MCH: 31.1 pg (ref 26.0–34.0)
MCHC: 33.6 g/dL (ref 30.0–36.0)
MCV: 92.4 fL (ref 80.0–100.0)
Monocytes Absolute: 0.8 10*3/uL (ref 0.1–1.0)
Monocytes Relative: 5 %
Neutro Abs: 12.6 10*3/uL — ABNORMAL HIGH (ref 1.7–7.7)
Neutrophils Relative %: 75 %
Platelets: 369 10*3/uL (ref 150–400)
RBC: 3.7 MIL/uL — ABNORMAL LOW (ref 3.87–5.11)
RDW: 14.2 % (ref 11.5–15.5)
WBC: 16.7 10*3/uL — ABNORMAL HIGH (ref 4.0–10.5)
nRBC: 0 % (ref 0.0–0.2)

## 2023-11-25 LAB — MAGNESIUM: Magnesium: 1.7 mg/dL (ref 1.7–2.4)

## 2023-11-25 LAB — HIV ANTIBODY (ROUTINE TESTING W REFLEX): HIV Screen 4th Generation wRfx: NONREACTIVE

## 2023-11-25 NOTE — Plan of Care (Signed)

## 2023-11-25 NOTE — Progress Notes (Signed)
 Subjective: CC: S/p IR drain w/ 20ml purulent fluid aspirated.  Abdominal pain much improved after IR drain. Most of her pain is around her drain site. Still w/ SP > LLQ abdominal pain today requiring several doses of pain med overnight however.  Tolerating cld but doesn't like options. Already adv to FLD by primary.  BM yesterday, non-bloody.   Afebrile (tmax 100.1), tachycardia resolved. No hypotension. WBC 16.7 (20.7). Cx pending.   Objective: Vital signs in last 24 hours: Temp:  [98.6 F (37 C)-100.1 F (37.8 C)] 98.6 F (37 C) (04/08 0436) Pulse Rate:  [65-106] 65 (04/08 0436) Resp:  [16-22] 17 (04/08 0436) BP: (116-134)/(72-88) 125/84 (04/08 0436) SpO2:  [95 %-99 %] 96 % (04/08 0436) Last BM Date : 11/24/23  Intake/Output from previous day: 04/07 0701 - 04/08 0700 In: 240 [P.O.:240] Out: 585 [Urine:500; Drains:85] Intake/Output this shift: No intake/output data recorded.  PE: Gen:  Alert, NAD, pleasant Abd: Soft, ND, suprapubic > LLQ ttp, +BS. IR drain w/ cloudy ss output in bulb  Lab Results:  Recent Labs    11/24/23 0622 11/25/23 0546  WBC 20.7* 16.7*  HGB 12.0 11.5*  HCT 35.9* 34.2*  PLT 370 369   BMET Recent Labs    11/24/23 0622 11/25/23 0546  NA 137 137  K 3.2* 3.6  CL 103 103  CO2 25 25  GLUCOSE 99 97  BUN 5* 5*  CREATININE 0.56 0.54  CALCIUM 7.8* 7.8*   PT/INR Recent Labs    11/24/23 0622  LABPROT 14.8  INR 1.1   CMP     Component Value Date/Time   NA 137 11/25/2023 0546   K 3.6 11/25/2023 0546   CL 103 11/25/2023 0546   CO2 25 11/25/2023 0546   GLUCOSE 97 11/25/2023 0546   BUN 5 (L) 11/25/2023 0546   CREATININE 0.54 11/25/2023 0546   CALCIUM 7.8 (L) 11/25/2023 0546   PROT 5.6 (L) 11/25/2023 0546   ALBUMIN 2.2 (L) 11/25/2023 0546   AST 11 (L) 11/25/2023 0546   ALT 20 11/25/2023 0546   ALKPHOS 60 11/25/2023 0546   BILITOT 0.5 11/25/2023 0546   GFRNONAA >60 11/25/2023 0546   GFRAA >60 01/19/2017 0307   Lipase      Component Value Date/Time   LIPASE 12.0 07/26/2022 0939    Studies/Results: CT GUIDED SOFT TISSUE FLUID DRAIN BY PERC CATH Result Date: 11/24/2023 CLINICAL DATA:  Pelvic diverticular abscess requiring percutaneous catheter drainage. EXAM: CT GUIDED CATHETER DRAINAGE OF PERITONEAL ABSCESS ANESTHESIA/SEDATION: Moderate (conscious) sedation was employed during this procedure. A total of Versed 1.5 mg and Fentanyl 175 mcg was administered intravenously. Moderate Sedation Time: 34 minutes. The patient's level of consciousness and vital signs were monitored continuously by radiology nursing throughout the procedure under my direct supervision. PROCEDURE: The procedure, risks, benefits, and alternatives were explained to the patient. Questions regarding the procedure were encouraged and answered. The patient understands and consents to the procedure. A time out was performed prior to initiating the procedure. The left abdominal wall was prepped with chlorhexidine in a sterile fashion, and a sterile drape was applied covering the operative field. A sterile gown and sterile gloves were used for the procedure. Local anesthesia was provided with 1% Lidocaine. CT was performed through the pelvis in a supine position without contrast to localize a pelvic sigmoid diverticular abscess. After localization, an 18 gauge trocar needle was advanced to the level of abscess under CT guidance. After confirming needle tip position,  fluid was aspirated through the needle. A guidewire was advanced into the collection. The percutaneous tract was dilated to 10 [place] over the wire. A 10 French percutaneous drainage catheter was advanced over the wire. Catheter position was confirmed by CT. A 20 mL fluid sample was withdrawn for culture analysis. The drain was flushed with sterile saline and attached to a suction bulb. It was secured at the skin with a Prolene retention suture and adhesive StatLock device. RADIATION DOSE REDUCTION: This  exam was performed according to the departmental dose-optimization program which includes automated exposure control, adjustment of the mA and/or kV according to patient size and/or use of iterative reconstruction technique. COMPLICATIONS: None FINDINGS: A percutaneous drainage catheter was successfully placed from a left anterolateral approach. There was return grossly purulent fluid. IMPRESSION: CT-guided percutaneous catheter drainage pelvic diverticular abscess. A 10 French percutaneous drain was placed and attached to suction bulb drainage. A 20 mL sample of purulent fluid was sent for culture analysis. Electronically Signed   By: Irish Lack M.D.   On: 11/24/2023 12:05   US PELVIC COMPLETE W TRANSVAGINAL AND TORSION R/O Result Date: 11/23/2023 CLINICAL DATA:  Left lower quadrant pain and nausea. Abnormal CT. Possible abscess. EXAM: TRANSABDOMINAL AND TRANSVAGINAL ULTRASOUND OF PELVIS DOPPLER ULTRASOUND OF OVARIES TECHNIQUE: Both transabdominal and transvaginal ultrasound examinations of the pelvis were performed. Transabdominal technique was performed for global imaging of the pelvis including uterus, ovaries, adnexal regions, and pelvic cul-de-sac. It was necessary to proceed with endovaginal exam following the transabdominal exam to visualize the adnexa. Color and duplex Doppler ultrasound was utilized to evaluate blood flow to the ovaries. COMPARISON:  CT of the abdomen and pelvis 11/23/2023. FINDINGS: Uterus Measurements: 10.8 x 4.0 x 4.2 cm = volume: 94.4 mL. No fibroids or other mass visualized. Endometrium Thickness: 6.0 mm.  IUD is in place. Right ovary Measurements: 3.9 x 2.6 x 1.9 cm = volume: 10.2 mL. Normal appearance/no adnexal mass. Left ovary The left ovary is not visualized. Pulsed Doppler evaluation of the right ovary demonstrates normal low-resistance arterial and venous waveforms. Other findings A heterogeneous collection in the left adnexa measures 5.0 x 4.4 x 4.9 cm. The wall is  somewhat thickened mild hyperemia surrounds the collection. This is consistent with an abscess as described on the CT scan. IMPRESSION: 1. 5.0 x 4.4 x 4.9 cm heterogeneous collection in the left adnexa is consistent with an abscess as described on the CT scan. 2. The left ovary is not visualized separate from the abscess. Ovarian etiology is not excluded. This still most likely represents a diverticular abscess. 3. Normal appearance of the uterus and right ovary. 4. IUD is in place. Electronically Signed   By: Marin Roberts M.D.   On: 11/23/2023 10:55    Anti-infectives: Anti-infectives (From admission, onward)    Start     Dose/Rate Route Frequency Ordered Stop   11/23/23 2230  metroNIDAZOLE (FLAGYL) IVPB 500 mg        500 mg 100 mL/hr over 60 Minutes Intravenous Every 12 hours 11/23/23 1339     11/23/23 1730  ceFEPIme (MAXIPIME) 2 g in sodium chloride 0.9 % 100 mL IVPB        2 g 200 mL/hr over 30 Minutes Intravenous Every 8 hours 11/23/23 1353     11/23/23 1030  fluconazole (DIFLUCAN) tablet 150 mg        150 mg Oral  Once 11/23/23 1024 11/23/23 1031   11/23/23 0915  ceFEPIme (MAXIPIME) 2 g in sodium  chloride 0.9 % 100 mL IVPB       Placed in "And" Linked Group   2 g 200 mL/hr over 30 Minutes Intravenous  Once 11/23/23 0910 11/23/23 0951   11/23/23 0915  metroNIDAZOLE (FLAGYL) IVPB 500 mg       Placed in "And" Linked Group   500 mg 100 mL/hr over 60 Minutes Intravenous  Once 11/23/23 0910 11/23/23 1132        Assessment/Plan Colonic Diverticulitis w/ abscess - S/p IR drain 4/7. Drain per IR - No current indication for emergency surgery - Cont IV antibiotics. Cx pending.  - Okay for FLD for options, told her to take this slow.  - Hopefully patient will improve with conservative treatment.  If patient fails to improve they may require repeating imaging, drain placement, or surgical intervention resulting in a colectomy/colostomy.  This was discussed with the patient. - We  will follow with you  FEN - FLD, IVF per primary  VTE - SCDs, sqh  ID - Cefepime/Flagyl. Afebrile (tmax 100.1). WBC 16.7 (20.7).   I reviewed nursing notes, last 24 h vitals and pain scores, last 48 h intake and output, last 24 h labs and trends, and last 24 h imaging results.   LOS: 2 days    Jacinto Halim, [facility] Surgery 11/25/2023, 9:19 AM Please see Amion for pager number during day hours 7:00am-4:30pm

## 2023-11-25 NOTE — Progress Notes (Signed)
 Pt reports feeling warm, Temp 99.9. She refused Heparin injection notified MD.

## 2023-11-25 NOTE — Progress Notes (Signed)
 PROGRESS NOTE    Faith Evans  ZOX:[id number] DOB: [birth year] DOA: 11/23/2023 PCP: Elenore Paddy, NP   Brief Narrative:  Faith Evans is a 43 y.o. female with past medical history of anxiety, obesity, GERD, congenital malformation of the lung, GERD, history of help syndrome presented to ED with complaints of left-sided abdominal pain that is been going on for about 4 weeks, patient was seen by PCP and started on antibiotics for diverticulitis 2 weeks ago which she has been taking but due to worsening pain, she came to the ED.  CT abdomen showed diverticulitis with abscess, admitted to hospital service, started on antibiotics, seen by general surgery.  Assessment & Plan:   Active Problems:   Colonic diverticular abscess  Sepsis secondary to acute diverticulitis with diverticular abscess: Diverticulitis and abscess both present upon admission but sepsis developed after admission.  Leukocytosis 25 but she did not meet criteria for sepsis at the time she was admitted however after admission she developed fever of 101.1 and with leukocytosis, she did meet criteria of sepsis after the admission, likely her lactic acid was normal.  She is afebrile since 4 PM on 11/23/2023.  Repeat lactic acid were normal.  Leukocytosis improving.  Patient afebrile.  Will recheck lactic acid today.  Her leukocytosis is improving.  Seen by general surgery and placed intra-abdominal drain by IR for 725.  Cultures so far growing gram-positive rods and cocci but final identification and sensitivity pending.  Patient remains on cefepime and Flagyl.  Urine antigen for gonococcal and chlamydia is pending.  Currently on full liquid diet.  Defer to general surgery for that.  Appreciate their help.  Hypomagnesemia/hypokalemia: Both resolved.  Class II obesity: Weight loss and diet modification counseled.  DVT prophylaxis: heparin injection 5,000 Units Start: 11/23/23 1400   Code Status: Full Code  Family Communication: Husband  and another family member present at bedside.  Plan of care discussed with patient in length and he/she verbalized understanding and agreed with it.  Status is: Inpatient Remains inpatient appropriate because: Patient has abdominal drain, needs antibiotics and daily monitoring.   Estimated body mass index is 31.98 kg/m as calculated from the following:   Height as of this encounter: 4\' 10"  (1.473 m).   Weight as of this encounter: 69.4 kg.    Nutritional Assessment: Body mass index is 31.98 kg/m.Marland Kitchen Seen by dietician.  I agree with the assessment and plan as outlined below: Nutrition Status:        . Skin Assessment: I have examined the patient's skin and I agree with the wound assessment as performed by the wound care RN as outlined below:    Consultants:  General surgery  Procedures:  As above  Antimicrobials:  Anti-infectives (From admission, onward)    Start     Dose/Rate Route Frequency Ordered Stop   11/23/23 2230  metroNIDAZOLE (FLAGYL) IVPB 500 mg        500 mg 100 mL/hr over 60 Minutes Intravenous Every 12 hours 11/23/23 1339     11/23/23 1730  ceFEPIme (MAXIPIME) 2 g in sodium chloride 0.9 % 100 mL IVPB        2 g 200 mL/hr over 30 Minutes Intravenous Every 8 hours 11/23/23 1353     11/23/23 1030  fluconazole (DIFLUCAN) tablet 150 mg        150 mg Oral  Once 11/23/23 1024 11/23/23 1031   11/23/23 0915  ceFEPIme (MAXIPIME) 2 g in sodium chloride 0.9 % 100 mL  IVPB       Placed in "And" Linked Group   2 g 200 mL/hr over 30 Minutes Intravenous  Once 11/23/23 0910 11/23/23 0951   11/23/23 0915  metroNIDAZOLE (FLAGYL) IVPB 500 mg       Placed in "And" Linked Group   500 mg 100 mL/hr over 60 Minutes Intravenous  Once 11/23/23 0910 11/23/23 1132         Subjective: Patient seen and examined.  She says that her abdominal pain is improved compared to yesterday and she has no other complaint.  Objective: Vitals:   11/24/23 1800 11/24/23 1938 11/25/23 0428  11/25/23 0436  BP: 134/77 124/85 123/79 125/84  Pulse: (!) 106 89 71 65  Resp: 16 18 18 17   Temp: 100.1 F (37.8 C) 99.7 F (37.6 C) 98.8 F (37.1 C) 98.6 F (37 C)  TempSrc: Oral Oral Oral Oral  SpO2: 99% 96% 97% 96%  Weight:      Height:        Intake/Output Summary (Last 24 hours) at 11/25/2023 0824 Last data filed at 11/25/2023 0700 Gross per 24 hour  Intake 240 ml  Output 585 ml  Net -345 ml   Filed Weights   11/23/23 0742  Weight: 69.4 kg    Examination:  General exam: Appears calm and comfortable, is Respiratory system: Clear to auscultation. Respiratory effort normal. Cardiovascular system: S1 & S2 heard, RRR. No JVD, murmurs, rubs, gallops or clicks. No pedal edema. Gastrointestinal system: Abdomen is nondistended, soft and generalized tenderness, more pronounced at left lower quadrant. No organomegaly or masses felt. Normal bowel sounds heard. Central nervous system: Alert and oriented. No focal neurological deficits. Extremities: Symmetric 5 x 5 power. Skin: No rashes, lesions or ulcers.  Psychiatry: Judgement and insight appear normal. Mood & affect appropriate.    Data Reviewed: I have personally reviewed following labs and imaging studies  CBC: Recent Labs  Lab 11/19/23 1525 11/23/23 0751 11/24/23 0622 11/25/23 0546  WBC 24.0 Repeated and verified X2.* 25.2* 20.7* 16.7*  NEUTROABS 19.8*  --   --  12.6*  HGB 13.4 12.9 12.0 11.5*  HCT 39.9 38.2 35.9* 34.2*  MCV 93.2 92.3 93.2 92.4  PLT 278.0 418* 370 369   Basic Metabolic Panel: Recent Labs  Lab 11/19/23 1525 11/23/23 0751 11/24/23 0622 11/25/23 0546  NA 135 137 137 137  K 3.2* 3.6 3.2* 3.6  CL 97 100 103 103  CO2 31 28 25 25   GLUCOSE 105* 98 99 97  BUN 13 8 5* 5*  CREATININE 0.60 0.52 0.56 0.54  CALCIUM 9.3 8.8* 7.8* 7.8*  MG  --   --  1.6* 1.7   GFR: Estimated Creatinine Clearance: 76.4 mL/min (by C-G formula based on SCr of 0.54 mg/dL). Liver Function Tests: Recent Labs  Lab  11/23/23 0751 11/24/23 0622 11/25/23 0546  AST 22 14* 11*  ALT 34 28 20  ALKPHOS 94 71 60  BILITOT 0.4 0.5 0.5  PROT 7.3 5.9* 5.6*  ALBUMIN 3.7 2.4* 2.2*   No results for input(s): "LIPASE", "AMYLASE" in the last 168 hours. No results for input(s): "AMMONIA" in the last 168 hours. Coagulation Profile: Recent Labs  Lab 11/24/23 0622  INR 1.1   Cardiac Enzymes: No results for input(s): "CKTOTAL", "CKMB", "CKMBINDEX", "TROPONINI" in the last 168 hours. BNP (last 3 results) No results for input(s): "PROBNP" in the last 8760 hours. HbA1C: Recent Labs    11/23/23 2234  HGBA1C 5.0   CBG: No results for  input(s): "GLUCAP" in the last 168 hours. Lipid Profile: No results for input(s): "CHOL", "HDL", "LDLCALC", "TRIG", "CHOLHDL", "LDLDIRECT" in the last 72 hours. Thyroid Function Tests: No results for input(s): "TSH", "T4TOTAL", "FREET4", "T3FREE", "THYROIDAB" in the last 72 hours. Anemia Panel: No results for input(s): "VITAMINB12", "FOLATE", "FERRITIN", "TIBC", "IRON", "RETICCTPCT" in the last 72 hours. Sepsis Labs: Recent Labs  Lab 11/23/23 1313 11/23/23 1559 11/24/23 1305  LATICACIDVEN 1.0 0.9 0.9    Recent Results (from the past 240 hours)  Urine Culture     Status: None   Collection Time: 11/19/23  3:25 PM   Specimen: Urine  Result Value Ref Range Status   Source: URINE  Final   Status: FINAL  Final   Result: No Growth  Final  Wet prep, genital     Status: Abnormal   Collection Time: 11/23/23  9:14 AM   Specimen: Cervical/Vaginal swab  Result Value Ref Range Status   Yeast Wet Prep HPF POC PRESENT (A) NONE SEEN Final   Trich, Wet Prep NONE SEEN NONE SEEN Final   Clue Cells Wet Prep HPF POC NONE SEEN NONE SEEN Final   WBC, Wet Prep HPF POC >=10 (A) <10 Final   Sperm NONE SEEN  Final    Comment: Performed at [facility], [address] [place], [place] [address]  Aerobic/Anaerobic Culture w Gram Stain (surgical/deep wound)     Status:  None (Preliminary result)   Collection Time: 11/24/23  8:58 PM   Specimen: Abscess  Result Value Ref Range Status   Specimen Description ABSCESS  Final   Special Requests NONE  Final   Gram Stain   Final    MODERATE WBC PRESENT, PREDOMINANTLY PMN MODERATE GRAM POSITIVE COCCI RARE GRAM POSITIVE RODS Performed at [facility], [address], [place], [place] [address]    Culture PENDING  Incomplete   Report Status PENDING  Incomplete     Radiology Studies: CT GUIDED SOFT TISSUE FLUID DRAIN BY PERC CATH Result Date: 11/24/2023 CLINICAL DATA:  Pelvic diverticular abscess requiring percutaneous catheter drainage. EXAM: CT GUIDED CATHETER DRAINAGE OF PERITONEAL ABSCESS ANESTHESIA/SEDATION: Moderate (conscious) sedation was employed during this procedure. A total of Versed 1.5 mg and Fentanyl 175 mcg was administered intravenously. Moderate Sedation Time: 34 minutes. The patient's level of consciousness and vital signs were monitored continuously by radiology nursing throughout the procedure under my direct supervision. PROCEDURE: The procedure, risks, benefits, and alternatives were explained to the patient. Questions regarding the procedure were encouraged and answered. The patient understands and consents to the procedure. A time out was performed prior to initiating the procedure. The left abdominal wall was prepped with chlorhexidine in a sterile fashion, and a sterile drape was applied covering the operative field. A sterile gown and sterile gloves were used for the procedure. Local anesthesia was provided with 1% Lidocaine. CT was performed through the pelvis in a supine position without contrast to localize a pelvic sigmoid diverticular abscess. After localization, an 18 gauge trocar needle was advanced to the level of abscess under CT guidance. After confirming needle tip position, fluid was aspirated through the needle. A guidewire was advanced into the collection. The percutaneous tract  was dilated to 10 [place] over the wire. A 10 French percutaneous drainage catheter was advanced over the wire. Catheter position was confirmed by CT. A 20 mL fluid sample was withdrawn for culture analysis. The drain was flushed with sterile saline and attached to a suction bulb. It was secured at  the skin with a Prolene retention suture and adhesive StatLock device. RADIATION DOSE REDUCTION: This exam was performed according to the departmental dose-optimization program which includes automated exposure control, adjustment of the mA and/or kV according to patient size and/or use of iterative reconstruction technique. COMPLICATIONS: None FINDINGS: A percutaneous drainage catheter was successfully placed from a left anterolateral approach. There was return grossly purulent fluid. IMPRESSION: CT-guided percutaneous catheter drainage pelvic diverticular abscess. A 10 French percutaneous drain was placed and attached to suction bulb drainage. A 20 mL sample of purulent fluid was sent for culture analysis. Electronically Signed   By: Irish Lack M.D.   On: 11/24/2023 12:05   US PELVIC COMPLETE W TRANSVAGINAL AND TORSION R/O Result Date: 11/23/2023 CLINICAL DATA:  Left lower quadrant pain and nausea. Abnormal CT. Possible abscess. EXAM: TRANSABDOMINAL AND TRANSVAGINAL ULTRASOUND OF PELVIS DOPPLER ULTRASOUND OF OVARIES TECHNIQUE: Both transabdominal and transvaginal ultrasound examinations of the pelvis were performed. Transabdominal technique was performed for global imaging of the pelvis including uterus, ovaries, adnexal regions, and pelvic cul-de-sac. It was necessary to proceed with endovaginal exam following the transabdominal exam to visualize the adnexa. Color and duplex Doppler ultrasound was utilized to evaluate blood flow to the ovaries. COMPARISON:  CT of the abdomen and pelvis 11/23/2023. FINDINGS: Uterus Measurements: 10.8 x 4.0 x 4.2 cm = volume: 94.4 mL. No fibroids or other mass visualized.  Endometrium Thickness: 6.0 mm.  IUD is in place. Right ovary Measurements: 3.9 x 2.6 x 1.9 cm = volume: 10.2 mL. Normal appearance/no adnexal mass. Left ovary The left ovary is not visualized. Pulsed Doppler evaluation of the right ovary demonstrates normal low-resistance arterial and venous waveforms. Other findings A heterogeneous collection in the left adnexa measures 5.0 x 4.4 x 4.9 cm. The wall is somewhat thickened mild hyperemia surrounds the collection. This is consistent with an abscess as described on the CT scan. IMPRESSION: 1. 5.0 x 4.4 x 4.9 cm heterogeneous collection in the left adnexa is consistent with an abscess as described on the CT scan. 2. The left ovary is not visualized separate from the abscess. Ovarian etiology is not excluded. This still most likely represents a diverticular abscess. 3. Normal appearance of the uterus and right ovary. 4. IUD is in place. Electronically Signed   By: Marin Roberts M.D.   On: 11/23/2023 10:55   CT ABDOMEN PELVIS W CONTRAST Result Date: 11/23/2023 CLINICAL DATA:  Left lower quadrant abdominal pain. EXAM: CT ABDOMEN AND PELVIS WITH CONTRAST TECHNIQUE: Multidetector CT imaging of the abdomen and pelvis was performed using the standard protocol following bolus administration of intravenous contrast. RADIATION DOSE REDUCTION: This exam was performed according to the departmental dose-optimization program which includes automated exposure control, adjustment of the mA and/or kV according to patient size and/or use of iterative reconstruction technique. CONTRAST:  80mL OMNIPAQUE IOHEXOL 300 MG/ML  SOLN COMPARISON:  03/06/2023 FINDINGS: Lower chest: Asymmetric elevation right hemidiaphragm with rightward heart displacement is similar to prior. Hepatobiliary: No suspicious focal abnormality within the liver parenchyma. Small area of low attenuation in the anterior liver, adjacent to the falciform ligament, is in a characteristic location for focal fatty  deposition. Cholecystectomy. No intrahepatic or extrahepatic biliary dilation. Pancreas: No focal mass lesion. No dilatation of the main duct. No intraparenchymal cyst. No peripancreatic edema. Spleen: No splenomegaly. No suspicious focal mass lesion. Adrenals/Urinary Tract: No adrenal nodule or mass. Right kidney unremarkable. 10 mm hypodensity lower pole left kidney is similar to prior, compatible with a simple cyst.  No followup imaging is recommended. No evidence for hydroureter. The urinary bladder appears normal for the degree of distention. Stomach/Bowel: Stomach is unremarkable. No gastric wall thickening. No evidence of outlet obstruction. Duodenum is normally positioned as is the ligament of Treitz. No small bowel wall thickening. No small bowel dilatation. The terminal ileum is normal. The appendix is normal. Right and transverse colon unremarkable. Diverticular disease is seen in the left colon. There is marked wall thickening in the sigmoid segment with pericolonic edema/inflammation. A 5.2 x 4.6 x 4.7 cm thick walled rim enhancing collection of gas and fluid is identified in the left pelvis. This is contiguous with the left posterolateral wall of the uterus, the inferior wall of the sigmoid colon, and the left gonadal vein passes into some amorphous soft tissue along the anterolateral margin of the lesion. Tiny gas bubbles to the left of the rectosigmoid junction on image 67/2 are probably within diverticuli but additional tiny gas locules along the low rectum (74/2) may be extraluminal. Vascular/Lymphatic: No abdominal aortic aneurysm. No abdominal aortic atherosclerotic calcification. No gastrohepatic or hepatoduodenal ligament lymphadenopathy. Small to upper normal retroperitoneal lymph nodes are seen in the region of the aortic bifurcation. Upper normal left pelvic sidewall lymph nodes evident. Reproductive: IUD is visualized in the uterus although position may be low, towards the internal cervical  os. Dominant follicle noted right ovary. Left ovary is not discretely visible given the left lower quadrant inflammatory change described above. Other: No substantial intraperitoneal free fluid. Musculoskeletal: No worrisome lytic or sclerotic osseous abnormality. IMPRESSION: 1. 5.2 x 4.6 x 4.7 cm thick walled rim enhancing collection of gas and fluid in the left pelvis is compatible with an abscess. This is contiguous with the left posterolateral wall of the uterus, the inferior wall of the sigmoid colon, and the left gonadal vein. 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 Pelvic ultrasound may prove helpful if the left ovary can be identified as separate from this inflammatory process. 2. Tiny gas locules to the left of the rectosigmoid junction are probably within diverticuli but additional tiny gas locules along the low rectum appear to be extraluminal. 3. IUD is visualized in the uterus although position may be low, towards the internal cervical os. 4. Asymmetric elevation right hemidiaphragm with rightward heart displacement is similar to prior. Electronically Signed   By: Kennith Center M.D.   On: 11/23/2023 09:02    Scheduled Meds:  docusate sodium  100 mg Oral BID   feeding supplement  1 Container Oral TID BM   heparin  5,000 Units Subcutaneous Q8H   sodium chloride flush  3 mL Intravenous Q12H   Continuous Infusions:  sodium chloride Stopped (11/24/23 0340)   ceFEPime (MAXIPIME) IV 2 g (11/25/23 0202)   metronidazole 500 mg (11/25/23 0009)     LOS: 2 days   Hughie Closs, MD [facility]  11/25/2023, 8:24 AM   *Please note that this is a verbal dictation therefore any spelling or grammatical errors are due to the "[employer] One"  system interpretation.  Please page via Amion and do not message via secure chat for urgent patient care matters. Secure chat can be used for non urgent patient care matters.  How to contact the [facility] Attending or Consulting provider 7A - 7P or  covering provider during after hours 7P -7A, for this patient?  Check the care team in [facility] and look for a) attending/consulting [facility] provider listed and b) the [facility] team listed. Page or secure chat 7A-7P. Log into [email or web address] and use [place]'s universal password to access. If you do not have the password, please contact the hospital operator. Locate the [facility] provider you are looking for under [facility] and page to a number that you can be directly reached. If you still have difficulty reaching the provider, please page the [facility] (Director on Call) for the Hospitalists listed on amion for assistance.

## 2023-11-25 NOTE — Progress Notes (Signed)
 Patient ID: Faith Evans, female   DOB: October 25, 1980, 43 y.o.   MRN: [id number]    Referring Physician(s): Dr. Kris Mouton   Supervising Physician: Marliss Coots  Patient Status:  [facility] - In-pt  Chief Complaint:  Pelvic Diverticular abscess, s/p pelvic drain placement 11/24/23  Subjective:  Day 1 post op from pelvic drain placement for diverticular abscess. Pt doing well, reports pain significantly improved. Tolerating PO intake. BM yesterday, non bloody. No new complaints.  Allergies: Amoxicillin and Cinnamon  Medications: Prior to Admission medications   Medication Sig Start Date End Date Taking? Authorizing Provider  acetaminophen (TYLENOL) 500 MG tablet Take 1,000 mg by mouth every 6 (six) hours as needed for moderate pain (pain score 4-6).   Yes [provider]  ciprofloxacin (CIPRO) 500 MG tablet Take 1 tablet (500 mg total) by mouth 2 (two) times daily for 7 days. 11/19/23 11/26/23 Yes Elenore Paddy, NP  HYDROcodone-acetaminophen (NORCO) 7.5-325 MG tablet Take 1 tablet by mouth every 6 (six) hours as needed for moderate pain (pain score 4-6). 11/19/23  Yes Elenore Paddy, NP  ibuprofen (ADVIL) 200 MG tablet Take 400 mg by mouth every 6 (six) hours as needed for moderate pain (pain score 4-6).   Yes [provider]  metroNIDAZOLE (FLAGYL) 500 MG tablet Take 1 tablet (500 mg total) by mouth 2 (two) times daily for 7 days. 11/19/23 11/26/23 Yes Elenore Paddy, NP  triamcinolone cream (KENALOG) 0.1 % Apply 1 application. topically 2 (two) times daily. To affected area till better Patient taking differently: Apply 1 application  topically daily as needed (Dermatitis). 11/21/21  Yes Zenia Resides, MD     Vital Signs: BP 134/88 (BP Location: Right Arm)   Pulse 88   Temp 98.8 F (37.1 C) (Oral)   Resp 16   Ht 4\' 10"  (1.473 m)   Wt 153 lb (69.4 kg)   SpO2 95%   BMI 31.98 kg/m   Physical Exam Vitals and nursing note reviewed.  Constitutional:      Appearance:  Normal appearance. She is not ill-appearing or toxic-appearing.  HENT:     Mouth/Throat:     Mouth: Mucous membranes are moist.     Pharynx: Oropharynx is clear.  Cardiovascular:     Rate and Rhythm: Normal rate and regular rhythm.  Pulmonary:     Effort: Pulmonary effort is normal.     Breath sounds: Normal breath sounds.  Abdominal:     Palpations: Abdomen is soft.     Tenderness: There is no abdominal tenderness.     Comments: + drain in LLQ. About 5-60ml of purulent material noted in bulb. No external abnormality. Flushes/aspirates easily.   Musculoskeletal:     Right lower leg: No edema.     Left lower leg: No edema.  Skin:    General: Skin is warm and dry.  Neurological:     General: No focal deficit present.     Mental Status: She is alert and oriented to person, place, and time. Mental status is at baseline.     Imaging: CT GUIDED SOFT TISSUE FLUID DRAIN BY [facility] CATH Result Date: 11/24/2023 CLINICAL DATA:  Pelvic diverticular abscess requiring percutaneous catheter drainage. EXAM: CT GUIDED CATHETER DRAINAGE OF PERITONEAL ABSCESS ANESTHESIA/SEDATION: Moderate (conscious) sedation was employed during this procedure. A total of Versed 1.5 mg and Fentanyl 175 mcg was administered intravenously. Moderate Sedation Time: 34 minutes. The patient's level of consciousness and vital signs were monitored continuously by radiology  nursing throughout the procedure under my direct supervision. PROCEDURE: The procedure, risks, benefits, and alternatives were explained to the patient. Questions regarding the procedure were encouraged and answered. The patient understands and consents to the procedure. A time out was performed prior to initiating the procedure. The left abdominal wall was prepped with chlorhexidine in a sterile fashion, and a sterile drape was applied covering the operative field. A sterile gown and sterile gloves were used for the procedure. Local anesthesia was provided with 1%  Lidocaine. CT was performed through the pelvis in a supine position without contrast to localize a pelvic sigmoid diverticular abscess. After localization, an 18 gauge trocar needle was advanced to the level of abscess under CT guidance. After confirming needle tip position, fluid was aspirated through the needle. A guidewire was advanced into the collection. The percutaneous tract was dilated to 10 [place] over the wire. A 10 French percutaneous drainage catheter was advanced over the wire. Catheter position was confirmed by CT. A 20 mL fluid sample was withdrawn for culture analysis. The drain was flushed with sterile saline and attached to a suction bulb. It was secured at the skin with a Prolene retention suture and adhesive StatLock device. RADIATION DOSE REDUCTION: This exam was performed according to the departmental dose-optimization program which includes automated exposure control, adjustment of the mA and/or kV according to patient size and/or use of iterative reconstruction technique. COMPLICATIONS: None FINDINGS: A percutaneous drainage catheter was successfully placed from a left anterolateral approach. There was return grossly purulent fluid. IMPRESSION: CT-guided percutaneous catheter drainage pelvic diverticular abscess. A 10 French percutaneous drain was placed and attached to suction bulb drainage. A 20 mL sample of purulent fluid was sent for culture analysis. Electronically Signed   By: Irish Lack M.D.   On: 11/24/2023 12:05   US PELVIC COMPLETE W TRANSVAGINAL AND TORSION R/O Result Date: 11/23/2023 CLINICAL DATA:  Left lower quadrant pain and nausea. Abnormal CT. Possible abscess. EXAM: TRANSABDOMINAL AND TRANSVAGINAL ULTRASOUND OF PELVIS DOPPLER ULTRASOUND OF OVARIES TECHNIQUE: Both transabdominal and transvaginal ultrasound examinations of the pelvis were performed. Transabdominal technique was performed for global imaging of the pelvis including uterus, ovaries, adnexal regions, and  pelvic cul-de-sac. It was necessary to proceed with endovaginal exam following the transabdominal exam to visualize the adnexa. Color and duplex Doppler ultrasound was utilized to evaluate blood flow to the ovaries. COMPARISON:  CT of the abdomen and pelvis 11/23/2023. FINDINGS: Uterus Measurements: 10.8 x 4.0 x 4.2 cm = volume: 94.4 mL. No fibroids or other mass visualized. Endometrium Thickness: 6.0 mm.  IUD is in place. Right ovary Measurements: 3.9 x 2.6 x 1.9 cm = volume: 10.2 mL. Normal appearance/no adnexal mass. Left ovary The left ovary is not visualized. Pulsed Doppler evaluation of the right ovary demonstrates normal low-resistance arterial and venous waveforms. Other findings A heterogeneous collection in the left adnexa measures 5.0 x 4.4 x 4.9 cm. The wall is somewhat thickened mild hyperemia surrounds the collection. This is consistent with an abscess as described on the CT scan. IMPRESSION: 1. 5.0 x 4.4 x 4.9 cm heterogeneous collection in the left adnexa is consistent with an abscess as described on the CT scan. 2. The left ovary is not visualized separate from the abscess. Ovarian etiology is not excluded. This still most likely represents a diverticular abscess. 3. Normal appearance of the uterus and right ovary. 4. IUD is in place. Electronically Signed   By: Marin Roberts M.D.   On: 11/23/2023 10:55  CT ABDOMEN PELVIS W CONTRAST Result Date: 11/23/2023 CLINICAL DATA:  Left lower quadrant abdominal pain. EXAM: CT ABDOMEN AND PELVIS WITH CONTRAST TECHNIQUE: Multidetector CT imaging of the abdomen and pelvis was performed using the standard protocol following bolus administration of intravenous contrast. RADIATION DOSE REDUCTION: This exam was performed according to the departmental dose-optimization program which includes automated exposure control, adjustment of the mA and/or kV according to patient size and/or use of iterative reconstruction technique. CONTRAST:  80mL OMNIPAQUE IOHEXOL  300 MG/ML  SOLN COMPARISON:  03/06/2023 FINDINGS: Lower chest: Asymmetric elevation right hemidiaphragm with rightward heart displacement is similar to prior. Hepatobiliary: No suspicious focal abnormality within the liver parenchyma. Small area of low attenuation in the anterior liver, adjacent to the falciform ligament, is in a characteristic location for focal fatty deposition. Cholecystectomy. No intrahepatic or extrahepatic biliary dilation. Pancreas: No focal mass lesion. No dilatation of the main duct. No intraparenchymal cyst. No peripancreatic edema. Spleen: No splenomegaly. No suspicious focal mass lesion. Adrenals/Urinary Tract: No adrenal nodule or mass. Right kidney unremarkable. 10 mm hypodensity lower pole left kidney is similar to prior, compatible with a simple cyst. No followup imaging is recommended. No evidence for hydroureter. The urinary bladder appears normal for the degree of distention. Stomach/Bowel: Stomach is unremarkable. No gastric wall thickening. No evidence of outlet obstruction. Duodenum is normally positioned as is the ligament of Treitz. No small bowel wall thickening. No small bowel dilatation. The terminal ileum is normal. The appendix is normal. Right and transverse colon unremarkable. Diverticular disease is seen in the left colon. There is marked wall thickening in the sigmoid segment with pericolonic edema/inflammation. A 5.2 x 4.6 x 4.7 cm thick walled rim enhancing collection of gas and fluid is identified in the left pelvis. This is contiguous with the left posterolateral wall of the uterus, the inferior wall of the sigmoid colon, and the left gonadal vein passes into some amorphous soft tissue along the anterolateral margin of the lesion. Tiny gas bubbles to the left of the rectosigmoid junction on image 67/2 are probably within diverticuli but additional tiny gas locules along the low rectum (74/2) may be extraluminal. Vascular/Lymphatic: No abdominal aortic aneurysm.  No abdominal aortic atherosclerotic calcification. No gastrohepatic or hepatoduodenal ligament lymphadenopathy. Small to upper normal retroperitoneal lymph nodes are seen in the region of the aortic bifurcation. Upper normal left pelvic sidewall lymph nodes evident. Reproductive: IUD is visualized in the uterus although position may be low, towards the internal cervical os. Dominant follicle noted right ovary. Left ovary is not discretely visible given the left lower quadrant inflammatory change described above. Other: No substantial intraperitoneal free fluid. Musculoskeletal: No worrisome lytic or sclerotic osseous abnormality. IMPRESSION: 1. 5.2 x 4.6 x 4.7 cm thick walled rim enhancing collection of gas and fluid in the left pelvis is compatible with an abscess. This is contiguous with the left posterolateral wall of the uterus, the inferior wall of the sigmoid colon, and the left gonadal vein. 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 Pelvic ultrasound may prove helpful if the left ovary can be identified as separate from this inflammatory process. 2. Tiny gas locules to the left of the rectosigmoid junction are probably within diverticuli but additional tiny gas locules along the low rectum appear to be extraluminal. 3.  IUD is visualized in the uterus although position may be low, towards the internal cervical os. 4. Asymmetric elevation right hemidiaphragm with rightward heart displacement is similar to prior. Electronically Signed   By: Kennith Center M.D.   On: 11/23/2023 09:02    Labs:  CBC: Recent Labs    11/19/23 1525 11/23/23 0751 11/24/23 0622 11/25/23 0546  WBC 24.0 Repeated and verified X2.* 25.2* 20.7* 16.7*  HGB 13.4 12.9 12.0 11.5*  HCT  39.9 38.2 35.9* 34.2*  PLT 278.0 418* 370 369    COAGS: Recent Labs    11/24/23 0622  INR 1.1    BMP: Recent Labs    01/14/23 0834 03/06/23 1417 11/19/23 1525 11/23/23 0751 11/24/23 0622 11/25/23 0546  NA 139   < > 135 137 137 137  K 3.7   < > 3.2* 3.6 3.2* 3.6  CL 105   < > 97 100 103 103  CO2 26   < > 31 28 25 25   GLUCOSE 97   < > 105* 98 99 97  BUN 13   < > 13 8 5* 5*  CALCIUM 8.7*   < > 9.3 8.8* 7.8* 7.8*  CREATININE 0.72   < > 0.60 0.52 0.56 0.54  GFRNONAA >60  --   --  >60 >60 >60   < > = values in this interval not displayed.    LIVER FUNCTION TESTS: Recent Labs    10/06/23 1005 11/23/23 0751 11/24/23 0622 11/25/23 0546  BILITOT 0.7 0.4 0.5 0.5  AST 66* 22 14* 11*  ALT 97* 34 28 20  ALKPHOS 100 94 71 60  PROT 7.5 7.3 5.9* 5.6*  ALBUMIN 4.2 3.7 2.4* 2.2*    Assessment and Plan:  Day 1 post op from pelvic drain placement. Pain much improved, pt doing well.   Drain Location: LLQ Size: Fr size: 10 Fr Date of placement: 11/24/23  Currently to: Drain collection device: suction bulb 24 hour output:  Output by Drain (mL) 11/23/23 0701 - 11/23/23 1900 11/23/23 1901 - 11/24/23 0700 11/24/23 0701 - 11/24/23 1900 11/24/23 1901 - 11/25/23 0700 11/25/23 0701 - 11/25/23 1403  Closed System Drain 1 Left;Anterior Hip Bulb (JP) 10 Fr.   75 10     Current examination: Flushes/aspirates easily.  Insertion site unremarkable. Suture and stat lock in place. Dressed appropriately.   Plan: Continue TID flushes with 5 cc NS. Record output Q shift. Dressing changes QD or PRN if soiled.  Call IR APP or on call IR MD if difficulty flushing or sudden change in drain output.  Repeat imaging/possible drain injection once output < 10 mL/QD (excluding flush material). Consideration for drain removal if output is < 10 mL/QD (excluding flush material), pending discussion with the providing surgical service.  Discharge planning: Please contact IR APP or on call IR MD prior  to patient d/c to ensure appropriate follow up plans are in place. Typically patient will follow up with IR clinic 10-14 days post d/c for repeat imaging/possible drain injection. IR scheduler will contact patient with date/time of appointment. Patient will need to flush drain QD with 5 cc NS, record output QD, dressing changes every 2-3 days or earlier if soiled.   IR will continue to follow - please call with questions or concerns.     Electronically Signed: Katheren Puller, PA-C 11/25/2023, 2:03 PM   I spent a total of 15 Minutes at the the patient's bedside AND on the patient's hospital floor or unit, greater  than 50% of which was counseling/coordinating care for pelvic drain for diverticular abscess.

## 2023-11-26 ENCOUNTER — Other Ambulatory Visit (HOSPITAL_COMMUNITY): Payer: Self-pay

## 2023-11-26 ENCOUNTER — Ambulatory Visit: Admitting: Family Medicine

## 2023-11-26 DIAGNOSIS — E66812 Obesity, class 2: Secondary | ICD-10-CM | POA: Diagnosis not present

## 2023-11-26 DIAGNOSIS — E785 Hyperlipidemia, unspecified: Secondary | ICD-10-CM | POA: Diagnosis not present

## 2023-11-26 DIAGNOSIS — Z6835 Body mass index (BMI) 35.0-35.9, adult: Secondary | ICD-10-CM

## 2023-11-26 DIAGNOSIS — K5720 Diverticulitis of large intestine with perforation and abscess without bleeding: Secondary | ICD-10-CM | POA: Diagnosis not present

## 2023-11-26 LAB — CBC
HCT: 38.9 % (ref 36.0–46.0)
Hemoglobin: 12.9 g/dL (ref 12.0–15.0)
MCH: 30.7 pg (ref 26.0–34.0)
MCHC: 33.2 g/dL (ref 30.0–36.0)
MCV: 92.6 fL (ref 80.0–100.0)
Platelets: 360 10*3/uL (ref 150–400)
RBC: 4.2 MIL/uL (ref 3.87–5.11)
RDW: 13.9 % (ref 11.5–15.5)
WBC: 13 10*3/uL — ABNORMAL HIGH (ref 4.0–10.5)
nRBC: 0 % (ref 0.0–0.2)

## 2023-11-26 LAB — MAGNESIUM: Magnesium: 1.9 mg/dL (ref 1.7–2.4)

## 2023-11-26 MED ORDER — CIPROFLOXACIN HCL 500 MG PO TABS
500.0000 mg | ORAL_TABLET | Freq: Two times a day (BID) | ORAL | 0 refills | Status: DC
  Filled 2023-11-26: qty 20, 10d supply, fill #0

## 2023-11-26 MED ORDER — LEVOFLOXACIN 750 MG PO TABS
750.0000 mg | ORAL_TABLET | Freq: Every day | ORAL | 0 refills | Status: DC
  Filled 2023-11-26: qty 10, 10d supply, fill #0

## 2023-11-26 MED ORDER — METRONIDAZOLE 500 MG PO TABS
500.0000 mg | ORAL_TABLET | Freq: Three times a day (TID) | ORAL | 0 refills | Status: AC
  Filled 2023-11-26: qty 30, 10d supply, fill #0

## 2023-11-26 MED ORDER — SODIUM CHLORIDE 0.9% FLUSH
INTRAVENOUS | 3 refills | Status: DC
  Filled 2023-11-26: qty 300, 30d supply, fill #0

## 2023-11-26 NOTE — Progress Notes (Addendum)
 Subjective: CC: Feeling better today. Reports back pain from the bed. Only PRN pain medication since I saw her yesterday was 650mg  tylenol at 2025 last night. No abdominal pain, n/v. Tolerating fld. BM yesterday that was non-bloody.   Afebrile. No tachycardia or hypotension. WBC downtrending at 13 from 16.7. Cx pending. Gram stain w/ gram positive cocci and rods.   Objective: Vital signs in last 24 hours: Temp:  [98.5 F (36.9 C)-99.7 F (37.6 C)] 98.5 F (36.9 C) (04/09 0813) Pulse Rate:  [75-92] 86 (04/09 0813) Resp:  [16-18] 18 (04/09 0421) BP: (134-146)/(85-96) 134/91 (04/09 0813) SpO2:  [95 %-100 %] 98 % (04/09 0813) Weight:  [69.6 kg] 69.6 kg (04/09 0500) Last BM Date : 11/25/23  Intake/Output from previous day: 04/08 0701 - 04/09 0700 In: 836.3 [IV Piggyback:836.3] Out: -  Intake/Output this shift: No intake/output data recorded.  PE: Gen:  Alert, NAD, pleasant Abd: Soft, ND, minimal ttp in the SP/LLQ near IR drain that is much improved from prior days, +BS. IR drain w/ cloudy ss output in bulb  Lab Results:  Recent Labs    11/25/23 0546 11/26/23 0622  WBC 16.7* 13.0*  HGB 11.5* 12.9  HCT 34.2* 38.9  PLT 369 360   BMET Recent Labs    11/24/23 0622 11/25/23 0546  NA 137 137  K 3.2* 3.6  CL 103 103  CO2 25 25  GLUCOSE 99 97  BUN 5* 5*  CREATININE 0.56 0.54  CALCIUM 7.8* 7.8*   PT/INR Recent Labs    11/24/23 0622  LABPROT 14.8  INR 1.1   CMP     Component Value Date/Time   NA 137 11/25/2023 0546   K 3.6 11/25/2023 0546   CL 103 11/25/2023 0546   CO2 25 11/25/2023 0546   GLUCOSE 97 11/25/2023 0546   BUN 5 (L) 11/25/2023 0546   CREATININE 0.54 11/25/2023 0546   CALCIUM 7.8 (L) 11/25/2023 0546   PROT 5.6 (L) 11/25/2023 0546   ALBUMIN 2.2 (L) 11/25/2023 0546   AST 11 (L) 11/25/2023 0546   ALT 20 11/25/2023 0546   ALKPHOS 60 11/25/2023 0546   BILITOT 0.5 11/25/2023 0546   GFRNONAA >60 11/25/2023 0546   GFRAA >60 01/19/2017 0307    Lipase     Component Value Date/Time   LIPASE 12.0 07/26/2022 0939    Studies/Results: CT GUIDED SOFT TISSUE FLUID DRAIN BY [facility] CATH Result Date: 11/24/2023 CLINICAL DATA:  Pelvic diverticular abscess requiring percutaneous catheter drainage. EXAM: CT GUIDED CATHETER DRAINAGE OF PERITONEAL ABSCESS ANESTHESIA/SEDATION: Moderate (conscious) sedation was employed during this procedure. A total of Versed 1.5 mg and Fentanyl 175 mcg was administered intravenously. Moderate Sedation Time: 34 minutes. The patient's level of consciousness and vital signs were monitored continuously by radiology nursing throughout the procedure under my direct supervision. PROCEDURE: The procedure, risks, benefits, and alternatives were explained to the patient. Questions regarding the procedure were encouraged and answered. The patient understands and consents to the procedure. A time out was performed prior to initiating the procedure. The left abdominal wall was prepped with chlorhexidine in a sterile fashion, and a sterile drape was applied covering the operative field. A sterile gown and sterile gloves were used for the procedure. Local anesthesia was provided with 1% Lidocaine. CT was performed through the pelvis in a supine position without contrast to localize a pelvic sigmoid diverticular abscess. After localization, an 18 gauge trocar needle was advanced to the level of abscess under CT  guidance. After confirming needle tip position, fluid was aspirated through the needle. A guidewire was advanced into the collection. The percutaneous tract was dilated to 10 [place] over the wire. A 10 French percutaneous drainage catheter was advanced over the wire. Catheter position was confirmed by CT. A 20 mL fluid sample was withdrawn for culture analysis. The drain was flushed with sterile saline and attached to a suction bulb. It was secured at the skin with a Prolene retention suture and adhesive StatLock device. RADIATION DOSE  REDUCTION: This exam was performed according to the departmental dose-optimization program which includes automated exposure control, adjustment of the mA and/or kV according to patient size and/or use of iterative reconstruction technique. COMPLICATIONS: None FINDINGS: A percutaneous drainage catheter was successfully placed from a left anterolateral approach. There was return grossly purulent fluid. IMPRESSION: CT-guided percutaneous catheter drainage pelvic diverticular abscess. A 10 French percutaneous drain was placed and attached to suction bulb drainage. A 20 mL sample of purulent fluid was sent for culture analysis. Electronically Signed   By: Irish Lack M.D.   On: 11/24/2023 12:05    Anti-infectives: Anti-infectives (From admission, onward)    Start     Dose/Rate Route Frequency Ordered Stop   11/23/23 2230  metroNIDAZOLE (FLAGYL) IVPB 500 mg        500 mg 100 mL/hr over 60 Minutes Intravenous Every 12 hours 11/23/23 1339     11/23/23 1730  ceFEPIme (MAXIPIME) 2 g in sodium chloride 0.9 % 100 mL IVPB        2 g 200 mL/hr over 30 Minutes Intravenous Every 8 hours 11/23/23 1353     11/23/23 1030  fluconazole (DIFLUCAN) tablet 150 mg        150 mg Oral  Once 11/23/23 1024 11/23/23 1031   11/23/23 0915  ceFEPIme (MAXIPIME) 2 g in sodium chloride 0.9 % 100 mL IVPB       Placed in "And" Linked Group   2 g 200 mL/hr over 30 Minutes Intravenous  Once 11/23/23 0910 11/23/23 0951   11/23/23 0915  metroNIDAZOLE (FLAGYL) IVPB 500 mg       Placed in "And" Linked Group   500 mg 100 mL/hr over 60 Minutes Intravenous  Once 11/23/23 0910 11/23/23 1132        Assessment/Plan Colonic Diverticulitis w/ abscess - S/p IR drain 4/7. Drain and drain flushes per IR - No current indication for emergency surgery - Cont antibiotics. Cx pending.  - Adv to soft diet - If pt tolerates soft diet, okay for d/c from our standpoint on oral abx. Will need f/u with IR. Will discuss w/ attending about f/u  w/ our office.   FEN - Soft, IVF per primary  VTE - SCDs, sqh  ID - Cefepime/Flagyl.   I reviewed nursing notes, last 24 h vitals and pain scores, last 48 h intake and output, last 24 h labs and trends, and last 24 h imaging results.   LOS: 3 days    Jacinto Halim, [facility] 11/26/2023, 8:49 AM Please see Amion for pager number during day hours 7:00am-4:30pm

## 2023-11-26 NOTE — Progress Notes (Signed)
 DISCHARGE NOTE HOME [facility]WATOMISIN [facility] to be discharged Home per MD order. Discussed prescriptions and follow up appointments with the patient. Prescriptions given to patient; medication list explained in detail. Patient verbalized understanding.  Skin clean, dry and intact without evidence of skin break down, no evidence of skin tears noted. IV catheter discontinued intact. Site without signs and symptoms of complications. Dressing and pressure applied. Pt denies pain at the site currently. No complaints noted.  See LDA for drain placement  An After Visit Summary (AVS) was printed and given to the patient. Patient escorted via wheelchair, and discharged home via private auto.  Velia Meyer, RN

## 2023-11-26 NOTE — Progress Notes (Signed)
 Patient ID: Faith Evans, female   DOB: [birth year], 43 y.o.   MRN: [id number]    Referring Physician(s): Dr. Kris Mouton   Supervising Physician: Richarda Overlie  Patient Status:  [facility] - In-pt  Chief Complaint:  Pelvic Diverticular abscess, s/p pelvic drain placement 11/24/23   Subjective:  Day 2 post op from pelvic drain placement for diverticular abscess. Pt states she feels even better than yesterday. Tolerating PO intake. Having BM, non-bloody. No new complaints.  Allergies: Amoxicillin and Cinnamon  Medications: Prior to Admission medications   Medication Sig Start Date End Date Taking? Authorizing Provider  acetaminophen (TYLENOL) 500 MG tablet Take 1,000 mg by mouth every 6 (six) hours as needed for moderate pain (pain score 4-6).   Yes [provider]  ciprofloxacin (CIPRO) 500 MG tablet Take 1 tablet (500 mg total) by mouth 2 (two) times daily for 7 days. 11/19/23 11/26/23 Yes Elenore Paddy, NP  HYDROcodone-acetaminophen (NORCO) 7.5-325 MG tablet Take 1 tablet by mouth every 6 (six) hours as needed for moderate pain (pain score 4-6). 11/19/23  Yes Elenore Paddy, NP  ibuprofen (ADVIL) 200 MG tablet Take 400 mg by mouth every 6 (six) hours as needed for moderate pain (pain score 4-6).   Yes [provider]  metroNIDAZOLE (FLAGYL) 500 MG tablet Take 1 tablet (500 mg total) by mouth 2 (two) times daily for 7 days. 11/19/23 11/26/23 Yes Elenore Paddy, NP  sodium chloride flush (NS) 0.9 % SOLN Flush drain every day with 5 mL normal saline. 11/26/23  Yes Stepehn Eckard H, PA-C  triamcinolone cream (KENALOG) 0.1 % Apply 1 application. topically 2 (two) times daily. To affected area till better Patient taking differently: Apply 1 application  topically daily as needed (Dermatitis). 11/21/21  Yes Zenia Resides, MD     Vital Signs: BP (!) 134/91   Pulse 86   Temp 98.5 F (36.9 C) (Oral)   Resp 18   Ht 4\' 10"  (1.473 m)   Wt 153 lb 8 oz (69.6 kg)   SpO2 98%   BMI 32.08  kg/m   Physical Exam Vitals and nursing note reviewed.  Constitutional:      Appearance: Normal appearance. She is not ill-appearing or toxic-appearing.  HENT:     Mouth/Throat:     Mouth: Mucous membranes are moist.     Pharynx: Oropharynx is clear.  Cardiovascular:     Rate and Rhythm: Normal rate and regular rhythm.  Pulmonary:     Effort: Pulmonary effort is normal.     Breath sounds: Normal breath sounds.  Abdominal:     Palpations: Abdomen is soft.     Tenderness: There is no abdominal tenderness.     Comments: + LLQ/pelvic drain. Appears well. No overlying abnormality. Mild amount (~16ml) of purulent drainage in bulb at bedside.  Musculoskeletal:     Right lower leg: No edema.     Left lower leg: No edema.  Skin:    General: Skin is warm and dry.  Neurological:     General: No focal deficit present.     Mental Status: She is alert and oriented to person, place, and time. Mental status is at baseline.     Imaging: CT GUIDED SOFT TISSUE FLUID DRAIN BY [facility] CATH Result Date: 11/24/2023 CLINICAL DATA:  Pelvic diverticular abscess requiring percutaneous catheter drainage. EXAM: CT GUIDED CATHETER DRAINAGE OF PERITONEAL ABSCESS ANESTHESIA/SEDATION: Moderate (conscious) sedation was employed during this procedure. A total of Versed 1.5 mg and  Fentanyl 175 mcg was administered intravenously. Moderate Sedation Time: 34 minutes. The patient's level of consciousness and vital signs were monitored continuously by radiology nursing throughout the procedure under my direct supervision. PROCEDURE: The procedure, risks, benefits, and alternatives were explained to the patient. Questions regarding the procedure were encouraged and answered. The patient understands and consents to the procedure. A time out was performed prior to initiating the procedure. The left abdominal wall was prepped with chlorhexidine in a sterile fashion, and a sterile drape was applied covering the operative field. A  sterile gown and sterile gloves were used for the procedure. Local anesthesia was provided with 1% Lidocaine. CT was performed through the pelvis in a supine position without contrast to localize a pelvic sigmoid diverticular abscess. After localization, an 18 gauge trocar needle was advanced to the level of abscess under CT guidance. After confirming needle tip position, fluid was aspirated through the needle. A guidewire was advanced into the collection. The percutaneous tract was dilated to 10 [place] over the wire. A 10 French percutaneous drainage catheter was advanced over the wire. Catheter position was confirmed by CT. A 20 mL fluid sample was withdrawn for culture analysis. The drain was flushed with sterile saline and attached to a suction bulb. It was secured at the skin with a Prolene retention suture and adhesive StatLock device. RADIATION DOSE REDUCTION: This exam was performed according to the departmental dose-optimization program which includes automated exposure control, adjustment of the mA and/or kV according to patient size and/or use of iterative reconstruction technique. COMPLICATIONS: None FINDINGS: A percutaneous drainage catheter was successfully placed from a left anterolateral approach. There was return grossly purulent fluid. IMPRESSION: CT-guided percutaneous catheter drainage pelvic diverticular abscess. A 10 French percutaneous drain was placed and attached to suction bulb drainage. A 20 mL sample of purulent fluid was sent for culture analysis. Electronically Signed   By: Irish Lack M.D.   On: 11/24/2023 12:05   US PELVIC COMPLETE W TRANSVAGINAL AND TORSION R/O Result Date: 11/23/2023 CLINICAL DATA:  Left lower quadrant pain and nausea. Abnormal CT. Possible abscess. EXAM: TRANSABDOMINAL AND TRANSVAGINAL ULTRASOUND OF PELVIS DOPPLER ULTRASOUND OF OVARIES TECHNIQUE: Both transabdominal and transvaginal ultrasound examinations of the pelvis were performed. Transabdominal  technique was performed for global imaging of the pelvis including uterus, ovaries, adnexal regions, and pelvic cul-de-sac. It was necessary to proceed with endovaginal exam following the transabdominal exam to visualize the adnexa. Color and duplex Doppler ultrasound was utilized to evaluate blood flow to the ovaries. COMPARISON:  CT of the abdomen and pelvis 11/23/2023. FINDINGS: Uterus Measurements: 10.8 x 4.0 x 4.2 cm = volume: 94.4 mL. No fibroids or other mass visualized. Endometrium Thickness: 6.0 mm.  IUD is in place. Right ovary Measurements: 3.9 x 2.6 x 1.9 cm = volume: 10.2 mL. Normal appearance/no adnexal mass. Left ovary The left ovary is not visualized. Pulsed Doppler evaluation of the right ovary demonstrates normal low-resistance arterial and venous waveforms. Other findings A heterogeneous collection in the left adnexa measures 5.0 x 4.4 x 4.9 cm. The wall is somewhat thickened mild hyperemia surrounds the collection. This is consistent with an abscess as described on the CT scan. IMPRESSION: 1. 5.0 x 4.4 x 4.9 cm heterogeneous collection in the left adnexa is consistent with an abscess as described on the CT scan. 2. The left ovary is not visualized separate from the abscess. Ovarian etiology is not excluded. This still most likely represents a diverticular abscess. 3. Normal appearance of the uterus  and right ovary. 4. IUD is in place. Electronically Signed   By: Marin Roberts M.D.   On: 11/23/2023 10:55   CT ABDOMEN PELVIS W CONTRAST Result Date: 11/23/2023 CLINICAL DATA:  Left lower quadrant abdominal pain. EXAM: CT ABDOMEN AND PELVIS WITH CONTRAST TECHNIQUE: Multidetector CT imaging of the abdomen and pelvis was performed using the standard protocol following bolus administration of intravenous contrast. RADIATION DOSE REDUCTION: This exam was performed according to the departmental dose-optimization program which includes automated exposure control, adjustment of the mA and/or kV  according to patient size and/or use of iterative reconstruction technique. CONTRAST:  80mL OMNIPAQUE IOHEXOL 300 MG/ML  SOLN COMPARISON:  03/06/2023 FINDINGS: Lower chest: Asymmetric elevation right hemidiaphragm with rightward heart displacement is similar to prior. Hepatobiliary: No suspicious focal abnormality within the liver parenchyma. Small area of low attenuation in the anterior liver, adjacent to the falciform ligament, is in a characteristic location for focal fatty deposition. Cholecystectomy. No intrahepatic or extrahepatic biliary dilation. Pancreas: No focal mass lesion. No dilatation of the main duct. No intraparenchymal cyst. No peripancreatic edema. Spleen: No splenomegaly. No suspicious focal mass lesion. Adrenals/Urinary Tract: No adrenal nodule or mass. Right kidney unremarkable. 10 mm hypodensity lower pole left kidney is similar to prior, compatible with a simple cyst. No followup imaging is recommended. No evidence for hydroureter. The urinary bladder appears normal for the degree of distention. Stomach/Bowel: Stomach is unremarkable. No gastric wall thickening. No evidence of outlet obstruction. Duodenum is normally positioned as is the ligament of Treitz. No small bowel wall thickening. No small bowel dilatation. The terminal ileum is normal. The appendix is normal. Right and transverse colon unremarkable. Diverticular disease is seen in the left colon. There is marked wall thickening in the sigmoid segment with pericolonic edema/inflammation. A 5.2 x 4.6 x 4.7 cm thick walled rim enhancing collection of gas and fluid is identified in the left pelvis. This is contiguous with the left posterolateral wall of the uterus, the inferior wall of the sigmoid colon, and the left gonadal vein passes into some amorphous soft tissue along the anterolateral margin of the lesion. Tiny gas bubbles to the left of the rectosigmoid junction on image 67/2 are probably within diverticuli but additional tiny  gas locules along the low rectum (74/2) may be extraluminal. Vascular/Lymphatic: No abdominal aortic aneurysm. No abdominal aortic atherosclerotic calcification. No gastrohepatic or hepatoduodenal ligament lymphadenopathy. Small to upper normal retroperitoneal lymph nodes are seen in the region of the aortic bifurcation. Upper normal left pelvic sidewall lymph nodes evident. Reproductive: IUD is visualized in the uterus although position may be low, towards the internal cervical os. Dominant follicle noted right ovary. Left ovary is not discretely visible given the left lower quadrant inflammatory change described above. Other: No substantial intraperitoneal free fluid. Musculoskeletal: No worrisome lytic or sclerotic osseous abnormality. IMPRESSION: 1. 5.2 x 4.6 x 4.7 cm thick walled rim enhancing collection of gas and fluid in the left pelvis is compatible with an abscess. This is contiguous with the left posterolateral wall of the uterus, the inferior wall of the sigmoid colon, and the left gonadal vein. 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 Pelvic ultrasound may prove helpful if the left ovary can be identified as separate from this inflammatory process. 2. Tiny gas locules to  the left of the rectosigmoid junction are probably within diverticuli but additional tiny gas locules along the low rectum appear to be extraluminal. 3. IUD is visualized in the uterus although position may be low, towards the internal cervical os. 4. Asymmetric elevation right hemidiaphragm with rightward heart displacement is similar to prior. Electronically Signed   By: Kennith Center M.D.   On: 11/23/2023 09:02    Labs:  CBC: Recent Labs    11/23/23 0751 11/24/23 0622  11/25/23 0546 11/26/23 0622  WBC 25.2* 20.7* 16.7* 13.0*  HGB 12.9 12.0 11.5* 12.9  HCT 38.2 35.9* 34.2* 38.9  PLT 418* 370 369 360    COAGS: Recent Labs    11/24/23 0622  INR 1.1    BMP: Recent Labs    01/14/23 0834 03/06/23 1417 11/19/23 1525 11/23/23 0751 11/24/23 0622 11/25/23 0546  NA 139   < > 135 137 137 137  K 3.7   < > 3.2* 3.6 3.2* 3.6  CL 105   < > 97 100 103 103  CO2 26   < > 31 28 25 25   GLUCOSE 97   < > 105* 98 99 97  BUN 13   < > 13 8 5* 5*  CALCIUM 8.7*   < > 9.3 8.8* 7.8* 7.8*  CREATININE 0.72   < > 0.60 0.52 0.56 0.54  GFRNONAA >60  --   --  >60 >60 >60   < > = values in this interval not displayed.    LIVER FUNCTION TESTS: Recent Labs    10/06/23 1005 11/23/23 0751 11/24/23 0622 11/25/23 0546  BILITOT 0.7 0.4 0.5 0.5  AST 66* 22 14* 11*  ALT 97* 34 28 20  ALKPHOS 100 94 71 60  PROT 7.5 7.3 5.9* 5.6*  ALBUMIN 4.2 3.7 2.4* 2.2*    Assessment and Plan:  Day 2 post op from pelvic drain placement for diverticular abscess. Pt states she feels even better than yesterday. Tolerating PO intake. Having BM, non-bloody. No new complaints.  General surgery planning discharge today.  Drain continues to appear well. Minimal (~5cc) of purulent drainage in bulb at bedside. No associated pain or other abnormality today.  Current examination: Flushes/aspirates easily.  Insertion site unremarkable. Suture and stat lock in place. Dressed appropriately.   Plan: Continue TID flushes with 5 cc NS. Record output Q shift. Dressing changes QD or PRN if soiled.  Call IR APP or on call IR MD if difficulty flushing or sudden change in drain output.  Repeat imaging/possible drain injection once output < 10 mL/QD (excluding flush material). Consideration for drain removal if output is < 10 mL/QD (excluding flush material), pending discussion with the providing surgical service.  Discharge planning: Please contact IR APP or on call IR MD prior to patient  d/c to ensure appropriate follow up plans are in place. Typically patient will follow up with IR clinic 10-14 days post d/c for repeat imaging/possible drain injection. IR scheduler will contact patient with date/time of appointment. Patient will need to flush drain QD with 5 cc NS, record output QD, dressing changes every 2-3 days or earlier if soiled.   IR will continue to follow outpatient - discharge orders placed today for follow up in 10-14 days, and flushes to use at home.     Electronically Signed: Katheren Puller, PA-C 11/26/2023, 9:25 AM   I spent a total of 15 Minutes at the the patient's bedside AND on the patient's hospital floor or unit, greater  than 50% of which was counseling/coordinating care for pelvic drain placement for diverticular abscess.

## 2023-11-26 NOTE — Progress Notes (Signed)
 This RN educated patient on how to care for JP drain and dressing changes per discharge instructions. This RN demonstrated on how to clean, flush, and drain jp drain. Informed patient this is to be done once daily and dressing change to be changed every three days unless it is soiled. Patient instructed to record how much output from drain. Patient was able to demonstrate back to this RN teach back instructions.   Discharge nurse will go over other instructions with patient to discharge to lounge.

## 2023-11-26 NOTE — Plan of Care (Signed)

## 2023-11-26 NOTE — Discharge Instructions (Signed)
 Interventional Radiology Percutaneous Abscess Drain Placement After Care   This sheet gives you information about how to care for yourself after your procedure. Your health care provider may also give you more specific instructions. Your drain was placed by an interventional radiologist with [facility]. If you have questions or concerns, contact [facility] at [phone].   What is a percutaneous drain?   A drain is a small plastic tube (catheter) that goes into the fluid collection in your body through your skin.   How long will I need the drain?   How long the drain needs to stay in is determined by where the drain is, how much comes out of the drain each day and if you are having any other surgical procedures.   Interventional radiology will determine when it is time to remove the drain. It is important to follow up as directed so that the drain can be removed as soon as it is safe to do so.   What can I expect after the procedure?   After the procedure, it is common to have:   A small amount of bruising and discomfort in the area where the drainage tube (catheter) was placed.   Sleepiness and fatigue. This should go away after the medicines you were given have worn off.   Follow these instructions at home:   Insertion site care   Check your insertion site when you change the bandage. Check for:   More redness, swelling, or pain.   More fluid or blood.   Warmth.   Pus or a bad smell.   When caring for your insertion site:   Wash your hands with soap and water for at least 20 seconds before and after you change your bandage (dressing). If soap and water are not available, use hand sanitizer.   You do not need to change your dressing everyday if it is clean and dry. Change your dressing every 3 days or as needed when it is soiled, wet or becoming dislodged. You will need to change your dressing each time you shower.   Leave stitches (sutures), skin  glue, or adhesive strips in place. These skin closures may need to stay in place for 2 weeks or longer. If adhesive strip edges start to loosen and curl up, you may trim the loose edges. Do not remove adhesive strips completely unless your health care provider tells you to do so.   Catheter care   Flush the catheter once per day with 5 mL of 0.9% normal saline unless you are told otherwise by your healthcare provider. This helps to prevent clogs in the catheter.   To disconnect the drain, turn the clear plastic tube to the left. Attach the saline syringe by placing it on the white end of the drain and turning gently to the right. Once attached gently push the plunger to the 5 mL mark. After you are done flushing, disconnect the syringe by turning to the left and reattach your drainage container   If you have a bulb please be sure the bulb is charged after reconnecting it - to do this pinch the bulb between your thumb and first finger and close the stopper located on the top of the bulb.    Check for fluid leaking from around your catheter (instead of fluid draining through your catheter). This may be a sign that the drain is no longer working correctly.   Write down the following information every time you empty your  bag:   The date and time.   The amount of drainage.   Activity   Rest at home for 1-2 days after your procedure.   For the first 48 hours do not lift anything more than 10 lbs (about a gallon of milk). You may perform moderate activities/exercise. Please avoid strenuous activities during this time.   Avoid any activities which may pull on your drain as this can cause your drain to become dislodged.   If you were given a sedative during the procedure, it can affect you for several hours. Do not drive or operate machinery until your health care provider says that it is safe.   General instructions   For mild pain take over-the-counter medications as needed for pain such as  Tylenol or Advil. If you are experiencing severe pain please call our office as this may indicate an issue with your drain.    If you were prescribed an antibiotic medicine, take it as told by your health care provider. Do not stop using the antibiotic even if you start to feel better.   You may shower 24 hours after the drain is placed. To do this cover the insertion site with a water tight material such as saran wrap and seal the edges with tape, you may also purchase waterproof dressings at your local drug store. Shower as usual and then remove the water tight dressing and any gauze/tape underneath it once you have exited the shower and dried off. Allow the area to air dry or pat dry with a clean towel. Once the skin is completely dry place a new gauze dressing. It is important to keep the site dry at all times to prevent infection.   Do not submerge the drain - this means you cannot take baths, swim, use a hot tub, etc. until the drain is removed.    Do not use any products that contain nicotine or tobacco, such as cigarettes, e-cigarettes, and chewing tobacco. If you need help quitting, ask your health care provider.   Keep all follow-up visits as told by your health care provider. This is important.   Contact a health care provider if:   You have less than 10 mL of drainage a day for 2-3 days in a row, or as directed by your health care provider.   You have any of these signs of infection:   More redness, swelling, or pain around your incision area.   More fluid or blood coming from your incision area.   Warmth coming from your incision area.   Pus or a bad smell coming from your incision area.   You have fluid leaking from around your catheter (instead of through your catheter).   You are unable to flush the drain.   You have a fever or chills.   You have pain that does not get better with medicine.   You have not been contacted to schedule a drain follow up appointment  within 10 days of discharge from the hospital.   Please call [facility] at [phone] with any questions or concerns.   Get help right away if:   Your catheter comes out.   You suddenly stop having drainage from your catheter.   You suddenly have blood in the fluid that is draining from your catheter.   You become dizzy or you faint.   You develop a rash.   You have nausea or vomiting.   You have difficulty breathing or you feel short  of breath.   You develop chest pain.   You have problems with your speech or vision.   You have trouble balancing or moving your arms or legs.   Summary   It is common to have a small amount of bruising and discomfort in the area where the drainage tube (catheter) was placed. You may also have minor discomfort with movement while the drain is in place.   Flush the drain once per day with 5 mL of 0.9% normal saline (unless you were told otherwise by your healthcare provider).    Record the amount of drainage from the bag every time you empty it.   Change the dressing every 3 days or earlier if soiled/wet. Keep the skin dry under the dressing.   You may shower with the drain in place. Do not submerge the drain (no baths, swimming, hot tubs, etc.).   Contact [facility] at [phone] you have more redness, swelling, or pain around your incision area or if you have pain that does not get better with medicine.   This information is not intended to replace advice given to you by your health care provider. Make sure you discuss any questions you have with your health care provider.   Document Revised: 11/08/2021 Document Reviewed: 07/31/2019   Elsevier Patient Education  2023 [employer]         Interventional Radiology Drain Record   Empty your drain at least once per day. You may empty it as often as needed. Use this form to write down the amount of fluid that has collected in the drainage container. Bring  this form with you to your follow-up visits. Please call [facility] at [phone] with any questions or concerns prior to your appointment.   Drain #1 location: ___________________   Date __________ Time __________ Amount __________   Date __________ Time __________ Amount __________   Date __________ Time __________ Amount __________   Date __________ Time __________ Amount __________   Date __________ Time __________ Amount __________   Date __________ Time __________ Amount __________   Date __________ Time __________ Amount __________   Date __________ Time __________ Amount __________   Date __________ Time __________ Amount __________   Date __________ Time __________ Amount __________   Date __________ Time __________ Amount __________   Date __________ Time __________ Amount __________   Date __________ Time __________ Amount __________   Date __________ Time __________ Amount __________

## 2023-11-26 NOTE — Discharge Summary (Signed)
 Physician Discharge Summary  Faith Evans ZOX:[id number] DOB: [birth year] DOA: 11/23/2023  PCP: Elenore Paddy, NP  Admit date: 11/23/2023 Discharge date: 11/26/2023 30 Day Unplanned Readmission Risk Score    Flowsheet Row ED to Hosp-Admission (Current) from 11/23/2023 in [facility]  30 Day Unplanned Readmission Risk Score (%) 7.06 Filed at 11/26/2023 0801       This score is the patient's risk of an unplanned readmission within 30 days of being discharged (0 -100%). The score is based on dignosis, age, lab data, medications, orders, and past utilization.   Low:  0-14.9   Medium: 15-21.9   High: 22-29.9   Extreme: 30 and above          Admitted From: Home Disposition: Home  Recommendations for Outpatient Follow-up:  Follow up with PCP in 1-2 weeks Please obtain BMP/CBC in one week Follow-up with general surgery in 1 to 2 weeks Follow-up with IR in 1 to 2 weeks Please follow up with your PCP on the following pending results: Unresulted Labs (From admission, onward)     Start     Ordered   11/24/23 0500  Magnesium  Daily,   R      11/23/23 1216              Home Health: None Equipment/Devices: None  Discharge Condition: Stable CODE STATUS: Full code Diet recommendation: Cardiac  Subjective: Seen and examined, feels much better.  Very minimal abdominal pain.  Has received only 1 dose of Tylenol in last 24 hours.  Tolerating full liquid diet.  Seen by general surgery.  They have cleared her for discharge and she feels comfortable going home.  Brief/Interim Summary: Faith Evans is a 43 y.o. female with past medical history of anxiety, obesity, GERD, congenital malformation of the lung, GERD, history of help syndrome presented to ED with complaints of left-sided abdominal pain that is been going on for about 4 weeks, patient was seen by PCP and started on antibiotics for diverticulitis 2 weeks ago which she has been taking but due to worsening pain, she  came to the ED.  CT abdomen showed diverticulitis with abscess, admitted to hospital service, started on antibiotics, seen by general surgery.  Details below.  Sepsis secondary to acute diverticulitis with diverticular abscess: Diverticulitis and abscess both present upon admission but sepsis developed after admission.  Leukocytosis 25 but she did not meet criteria for sepsis at the time she was admitted however after admission she developed fever of 101.1 and with leukocytosis, she did meet criteria of sepsis after the admission, likely her lactic acid was normal.  She is afebrile since 4 PM on 11/23/2023.  Repeat lactic acid were normal.  Leukocytosis improving.  Patient afebrile.  Seen by general surgery and placed intra-abdominal drain by IR on 11/24/2023.  Cultures so far growing gram-positive rods and cocci but final identification and sensitivity pending.  Patient remains on cefepime and Flagyl.  Urine antigen for gonococcal and chlamydia is negative.  Currently on full liquid diet.  Pain was well-controlled.  Diet advance to full liquid diet.  Seen and cleared by general surgery if tolerates soft diet and she is very confident that she will and she wants to go home.  General surgery recommended 10 more days of oral ciprofloxacin and Flagyl.  General surgery as well as IR will follow-up with this patient as outpatient.   Hypomagnesemia/hypokalemia: Both resolved.   Class II obesity: Weight loss and diet modification  counseled.  Discharge plan was discussed with patient and/or family member and they verbalized understanding and agreed with it.  Discharge Diagnoses:  Active Problems:   Class 2 obesity with body mass index (BMI) of 35.0 to 35.9 in adult   Hyperlipidemia   Colonic diverticular abscess    Discharge Instructions   Allergies as of 11/26/2023       Reactions   Amoxicillin Rash   Cinnamon Rash        Medication List     TAKE these medications    acetaminophen 500 MG  tablet Commonly known as: TYLENOL Take 1,000 mg by mouth every 6 (six) hours as needed for moderate pain (pain score 4-6).   ciprofloxacin 500 MG tablet Commonly known as: Cipro Take 1 tablet (500 mg total) by mouth 2 (two) times daily for 10 days.   HYDROcodone-acetaminophen 7.5-325 MG tablet Commonly known as: NORCO Take 1 tablet by mouth every 6 (six) hours as needed for moderate pain (pain score 4-6).   ibuprofen 200 MG tablet Commonly known as: ADVIL Take 400 mg by mouth every 6 (six) hours as needed for moderate pain (pain score 4-6).   metroNIDAZOLE 500 MG tablet Commonly known as: Flagyl Take 1 tablet (500 mg total) by mouth 3 (three) times daily for 10 days. What changed: when to take this   sodium chloride flush 0.9 % Soln Commonly known as: NS Flush drain every day with 5 mL normal saline.   triamcinolone cream 0.1 % Commonly known as: KENALOG Apply 1 application. topically 2 (two) times daily. To affected area till better What changed:  when to take this reasons to take this additional instructions        Follow-up Information     Elenore Paddy, NP Follow up in 1 week(s).   Specialty: Nurse Practitioner Contact information: [address] [place] [address] [phone]                Allergies  Allergen Reactions   Amoxicillin Rash   Cinnamon Rash    Consultations: General Surgery and IR   Procedures/Studies: CT GUIDED SOFT TISSUE FLUID DRAIN BY PERC CATH Result Date: 11/24/2023 CLINICAL DATA:  Pelvic diverticular abscess requiring percutaneous catheter drainage. EXAM: CT GUIDED CATHETER DRAINAGE OF PERITONEAL ABSCESS ANESTHESIA/SEDATION: Moderate (conscious) sedation was employed during this procedure. A total of Versed 1.5 mg and Fentanyl 175 mcg was administered intravenously. Moderate Sedation Time: 34 minutes. The patient's level of consciousness and vital signs were monitored continuously by radiology nursing throughout the  procedure under my direct supervision. PROCEDURE: The procedure, risks, benefits, and alternatives were explained to the patient. Questions regarding the procedure were encouraged and answered. The patient understands and consents to the procedure. A time out was performed prior to initiating the procedure. The left abdominal wall was prepped with chlorhexidine in a sterile fashion, and a sterile drape was applied covering the operative field. A sterile gown and sterile gloves were used for the procedure. Local anesthesia was provided with 1% Lidocaine. CT was performed through the pelvis in a supine position without contrast to localize a pelvic sigmoid diverticular abscess. After localization, an 18 gauge trocar needle was advanced to the level of abscess under CT guidance. After confirming needle tip position, fluid was aspirated through the needle. A guidewire was advanced into the collection. The percutaneous tract was dilated to 10 [place] over the wire. A 10 French percutaneous drainage catheter was advanced over the wire. Catheter position was confirmed by  CT. A 20 mL fluid sample was withdrawn for culture analysis. The drain was flushed with sterile saline and attached to a suction bulb. It was secured at the skin with a Prolene retention suture and adhesive StatLock device. RADIATION DOSE REDUCTION: This exam was performed according to the departmental dose-optimization program which includes automated exposure control, adjustment of the mA and/or kV according to patient size and/or use of iterative reconstruction technique. COMPLICATIONS: None FINDINGS: A percutaneous drainage catheter was successfully placed from a left anterolateral approach. There was return grossly purulent fluid. IMPRESSION: CT-guided percutaneous catheter drainage pelvic diverticular abscess. A 10 French percutaneous drain was placed and attached to suction bulb drainage. A 20 mL sample of purulent fluid was sent for culture  analysis. Electronically Signed   By: Irish Lack M.D.   On: 11/24/2023 12:05   US PELVIC COMPLETE W TRANSVAGINAL AND TORSION R/O Result Date: 11/23/2023 CLINICAL DATA:  Left lower quadrant pain and nausea. Abnormal CT. Possible abscess. EXAM: TRANSABDOMINAL AND TRANSVAGINAL ULTRASOUND OF PELVIS DOPPLER ULTRASOUND OF OVARIES TECHNIQUE: Both transabdominal and transvaginal ultrasound examinations of the pelvis were performed. Transabdominal technique was performed for global imaging of the pelvis including uterus, ovaries, adnexal regions, and pelvic cul-de-sac. It was necessary to proceed with endovaginal exam following the transabdominal exam to visualize the adnexa. Color and duplex Doppler ultrasound was utilized to evaluate blood flow to the ovaries. COMPARISON:  CT of the abdomen and pelvis 11/23/2023. FINDINGS: Uterus Measurements: 10.8 x 4.0 x 4.2 cm = volume: 94.4 mL. No fibroids or other mass visualized. Endometrium Thickness: 6.0 mm.  IUD is in place. Right ovary Measurements: 3.9 x 2.6 x 1.9 cm = volume: 10.2 mL. Normal appearance/no adnexal mass. Left ovary The left ovary is not visualized. Pulsed Doppler evaluation of the right ovary demonstrates normal low-resistance arterial and venous waveforms. Other findings A heterogeneous collection in the left adnexa measures 5.0 x 4.4 x 4.9 cm. The wall is somewhat thickened mild hyperemia surrounds the collection. This is consistent with an abscess as described on the CT scan. IMPRESSION: 1. 5.0 x 4.4 x 4.9 cm heterogeneous collection in the left adnexa is consistent with an abscess as described on the CT scan. 2. The left ovary is not visualized separate from the abscess. Ovarian etiology is not excluded. This still most likely represents a diverticular abscess. 3. Normal appearance of the uterus and right ovary. 4. IUD is in place. Electronically Signed   By: Marin Roberts M.D.   On: 11/23/2023 10:55   CT ABDOMEN PELVIS W CONTRAST Result  Date: 11/23/2023 CLINICAL DATA:  Left lower quadrant abdominal pain. EXAM: CT ABDOMEN AND PELVIS WITH CONTRAST TECHNIQUE: Multidetector CT imaging of the abdomen and pelvis was performed using the standard protocol following bolus administration of intravenous contrast. RADIATION DOSE REDUCTION: This exam was performed according to the departmental dose-optimization program which includes automated exposure control, adjustment of the mA and/or kV according to patient size and/or use of iterative reconstruction technique. CONTRAST:  80mL OMNIPAQUE IOHEXOL 300 MG/ML  SOLN COMPARISON:  03/06/2023 FINDINGS: Lower chest: Asymmetric elevation right hemidiaphragm with rightward heart displacement is similar to prior. Hepatobiliary: No suspicious focal abnormality within the liver parenchyma. Small area of low attenuation in the anterior liver, adjacent to the falciform ligament, is in a characteristic location for focal fatty deposition. Cholecystectomy. No intrahepatic or extrahepatic biliary dilation. Pancreas: No focal mass lesion. No dilatation of the main duct. No intraparenchymal cyst. No peripancreatic edema. Spleen: No splenomegaly. No suspicious focal  mass lesion. Adrenals/Urinary Tract: No adrenal nodule or mass. Right kidney unremarkable. 10 mm hypodensity lower pole left kidney is similar to prior, compatible with a simple cyst. No followup imaging is recommended. No evidence for hydroureter. The urinary bladder appears normal for the degree of distention. Stomach/Bowel: Stomach is unremarkable. No gastric wall thickening. No evidence of outlet obstruction. Duodenum is normally positioned as is the ligament of Treitz. No small bowel wall thickening. No small bowel dilatation. The terminal ileum is normal. The appendix is normal. Right and transverse colon unremarkable. Diverticular disease is seen in the left colon. There is marked wall thickening in the sigmoid segment with pericolonic edema/inflammation. A 5.2  x 4.6 x 4.7 cm thick walled rim enhancing collection of gas and fluid is identified in the left pelvis. This is contiguous with the left posterolateral wall of the uterus, the inferior wall of the sigmoid colon, and the left gonadal vein passes into some amorphous soft tissue along the anterolateral margin of the lesion. Tiny gas bubbles to the left of the rectosigmoid junction on image 67/2 are probably within diverticuli but additional tiny gas locules along the low rectum (74/2) may be extraluminal. Vascular/Lymphatic: No abdominal aortic aneurysm. No abdominal aortic atherosclerotic calcification. No gastrohepatic or hepatoduodenal ligament lymphadenopathy. Small to upper normal retroperitoneal lymph nodes are seen in the region of the aortic bifurcation. Upper normal left pelvic sidewall lymph nodes evident. Reproductive: IUD is visualized in the uterus although position may be low, towards the internal cervical os. Dominant follicle noted right ovary. Left ovary is not discretely visible given the left lower quadrant inflammatory change described above. Other: No substantial intraperitoneal free fluid. Musculoskeletal: No worrisome lytic or sclerotic osseous abnormality. IMPRESSION: 1. 5.2 x 4.6 x 4.7 cm thick walled rim enhancing collection of gas and fluid in the left pelvis is compatible with an abscess. This is contiguous with the left posterolateral wall of the uterus, the inferior wall of the sigmoid colon, and the left gonadal vein. 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 Pelvic ultrasound may prove helpful if the left ovary can be identified as separate from this inflammatory process. 2. Tiny gas locules to the left of the rectosigmoid junction are  probably within diverticuli but additional tiny gas locules along the low rectum appear to be extraluminal. 3. IUD is visualized in the uterus although position may be low, towards the internal cervical os. 4. Asymmetric elevation right hemidiaphragm with rightward heart displacement is similar to prior. Electronically Signed   By: Kennith Center M.D.   On: 11/23/2023 09:02     Discharge Exam: Vitals:   11/26/23 0421 11/26/23 0813  BP: 138/85 (!) 134/91  Pulse: 75 86  Resp: 18   Temp: 98.9 F (37.2 C) 98.5 F (36.9 C)  SpO2: 97% 98%   Vitals:   11/25/23 1958 11/26/23 0421 11/26/23 0500 11/26/23 0813  BP: (!) 146/88 138/85  (!) 134/91  Pulse: 90 75  86  Resp: 18 18    Temp: 99 F (37.2 C) 98.9 F (37.2 C)  98.5 F (36.9 C)  TempSrc: Oral Oral  Oral  SpO2: 98% 97%  98%  Weight:   69.6 kg   Height:        General: Pt is alert, awake, not in acute  distress Cardiovascular: RRR, S1/S2 +, no rubs, no gallops Respiratory: CTA bilaterally, no wheezing, no rhonchi Abdominal: Soft, very mild left lower quadrant tenderness, abdominal drain noted, ND, bowel sounds + Extremities: no edema, no cyanosis    The results of significant diagnostics from this hospitalization (including imaging, microbiology, ancillary and laboratory) are listed below for reference.     Microbiology: Recent Results (from the past 240 hours)  Urine Culture     Status: None   Collection Time: 11/19/23  3:25 PM   Specimen: Urine  Result Value Ref Range Status   Source: URINE  Final   Status: FINAL  Final   Result: No Growth  Final  Wet prep, genital     Status: Abnormal   Collection Time: 11/23/23  9:14 AM   Specimen: Cervical/Vaginal swab  Result Value Ref Range Status   Yeast Wet Prep HPF POC PRESENT (A) NONE SEEN Final   Trich, Wet Prep NONE SEEN NONE SEEN Final   Clue Cells Wet Prep HPF POC NONE SEEN NONE SEEN Final   WBC, Wet Prep HPF POC >=10 (A) <10 Final   Sperm NONE SEEN  Final    Comment:  Performed at [facility], [address] [place], [place] [address]  Aerobic/Anaerobic Culture w Gram Stain (surgical/deep wound)     Status: None (Preliminary result)   Collection Time: 11/24/23  8:58 PM   Specimen: Abscess  Result Value Ref Range Status   Specimen Description ABSCESS  Final   Special Requests NONE  Final   Gram Stain   Final    MODERATE WBC PRESENT, PREDOMINANTLY PMN MODERATE GRAM POSITIVE COCCI RARE GRAM POSITIVE RODS    Culture   Final    TOO YOUNG TO READ Performed at [facility], [address], [place], [place] [address]    Report Status PENDING  Incomplete     Labs: BNP (last 3 results) No results for input(s): "BNP" in the last 8760 hours. Basic Metabolic Panel: Recent Labs  Lab 11/19/23 1525 11/23/23 0751 11/24/23 0622 11/25/23 0546 11/26/23 0622  NA 135 137 137 137  --   K 3.2* 3.6 3.2* 3.6  --   CL 97 100 103 103  --   CO2 31 28 25 25   --   GLUCOSE 105* 98 99 97  --   BUN 13 8 5* 5*  --   CREATININE 0.60 0.52 0.56 0.54  --   CALCIUM 9.3 8.8* 7.8* 7.8*  --   MG  --   --  1.6* 1.7 1.9   Liver Function Tests: Recent Labs  Lab 11/23/23 0751 11/24/23 0622 11/25/23 0546  AST 22 14* 11*  ALT 34 28 20  ALKPHOS 94 71 60  BILITOT 0.4 0.5 0.5  PROT 7.3 5.9* 5.6*  ALBUMIN 3.7 2.4* 2.2*   No results for input(s): "LIPASE", "AMYLASE" in the last 168 hours. No results for input(s): "AMMONIA" in the last 168 hours. CBC: Recent Labs  Lab 11/19/23 1525 11/23/23 0751 11/24/23 0622 11/25/23 0546 11/26/23 0622  WBC 24.0 Repeated and verified X2.* 25.2* 20.7* 16.7* 13.0*  NEUTROABS 19.8*  --   --  12.6*  --   HGB 13.4 12.9 12.0 11.5* 12.9  HCT 39.9 38.2 35.9* 34.2* 38.9  MCV 93.2 92.3 93.2 92.4 92.6  PLT 278.0 418* 370 369 360   Cardiac Enzymes: No results for input(s): "CKTOTAL", "CKMB", "CKMBINDEX", "TROPONINI" in the last 168 hours. BNP: Invalid input(s): "POCBNP" CBG: No results for  input(s): "GLUCAP"  in the last 168 hours. D-Dimer No results for input(s): "DDIMER" in the last 72 hours. Hgb A1c Recent Labs    11/23/23 2234  HGBA1C 5.0   Lipid Profile No results for input(s): "CHOL", "HDL", "LDLCALC", "TRIG", "CHOLHDL", "LDLDIRECT" in the last 72 hours. Thyroid function studies No results for input(s): "TSH", "T4TOTAL", "T3FREE", "THYROIDAB" in the last 72 hours.  Invalid input(s): "FREET3" Anemia work up No results for input(s): "VITAMINB12", "FOLATE", "FERRITIN", "TIBC", "IRON", "RETICCTPCT" in the last 72 hours. Urinalysis    Component Value Date/Time   COLORURINE YELLOW 11/23/2023 0751   APPEARANCEUR CLEAR 11/23/2023 0751   LABSPEC 1.029 11/23/2023 0751   PHURINE 5.5 11/23/2023 0751   GLUCOSEU NEGATIVE 11/23/2023 0751   GLUCOSEU NEGATIVE 11/19/2023 1525   HGBUR SMALL (A) 11/23/2023 0751   BILIRUBINUR NEGATIVE 11/23/2023 0751   BILIRUBINUR negative 11/20/2023 0802   KETONESUR 40 (A) 11/23/2023 0751   PROTEINUR 30 (A) 11/23/2023 0751   UROBILINOGEN 1.0 11/20/2023 0802   UROBILINOGEN 2.0 (A) 11/19/2023 1525   NITRITE NEGATIVE 11/23/2023 0751   LEUKOCYTESUR MODERATE (A) 11/23/2023 0751   Sepsis Labs Recent Labs  Lab 11/23/23 0751 11/24/23 0622 11/25/23 0546 11/26/23 0622  WBC 25.2* 20.7* 16.7* 13.0*   Microbiology Recent Results (from the past 240 hours)  Urine Culture     Status: None   Collection Time: 11/19/23  3:25 PM   Specimen: Urine  Result Value Ref Range Status   Source: URINE  Final   Status: FINAL  Final   Result: No Growth  Final  Wet prep, genital     Status: Abnormal   Collection Time: 11/23/23  9:14 AM   Specimen: Cervical/Vaginal swab  Result Value Ref Range Status   Yeast Wet Prep HPF POC PRESENT (A) NONE SEEN Final   Trich, Wet Prep NONE SEEN NONE SEEN Final   Clue Cells Wet Prep HPF POC NONE SEEN NONE SEEN Final   WBC, Wet Prep HPF POC >=10 (A) <10 Final   Sperm NONE SEEN  Final    Comment: Performed at [facility],  [address] [place], [place], [place] [address]  Aerobic/Anaerobic Culture w Gram Stain (surgical/deep wound)     Status: None (Preliminary result)   Collection Time: 11/24/23  8:58 PM   Specimen: Abscess  Result Value Ref Range Status   Specimen Description ABSCESS  Final   Special Requests NONE  Final   Gram Stain   Final    MODERATE WBC PRESENT, PREDOMINANTLY PMN MODERATE GRAM POSITIVE COCCI RARE GRAM POSITIVE RODS    Culture   Final    TOO YOUNG TO READ Performed at [facility], [address] [place], [place] [address]    Report Status PENDING  Incomplete    FURTHER DISCHARGE INSTRUCTIONS:   Get Medicines reviewed and adjusted: Please take all your medications with you for your next visit with your Primary MD   Laboratory/radiological data: Please request your Primary MD to go over all hospital tests and procedure/radiological results at the follow up, please ask your Primary MD to get all Hospital records sent to his/her office.   In some cases, they will be blood work, cultures and biopsy results pending at the time of your discharge. Please request that your primary care M.D. goes through all the records of your hospital data and follows up on these results.   Also Note the following: If you experience worsening of your admission symptoms, develop shortness of breath, life threatening  emergency, suicidal or homicidal thoughts you must seek medical attention immediately by calling 911 or calling your MD immediately  if symptoms less severe.   You must read complete instructions/literature along with all the possible adverse reactions/side effects for all the Medicines you take and that have been prescribed to you. Take any new Medicines after you have completely understood and accpet all the possible adverse reactions/side effects.    Do not drive when taking Pain medications or sleeping medications (Benzodaizepines)   Do not take more than prescribed Pain, Sleep and  Anxiety Medications. It is not advisable to combine anxiety,sleep and pain medications without talking with your primary care practitioner   Special Instructions: If you have smoked or chewed Tobacco  in the last 2 yrs please stop smoking, stop any regular Alcohol  and or any Recreational drug use.   Wear Seat belts while driving.   Please note: You were cared for by a hospitalist during your hospital stay. Once you are discharged, your primary care physician will handle any further medical issues. Please note that NO REFILLS for any discharge medications will be authorized once you are discharged, as it is imperative that you return to your primary care physician (or establish a relationship with a primary care physician if you do not have one) for your post hospital discharge needs so that they can reassess your need for medications and monitor your lab values  Time coordinating discharge: Over 30 minutes  SIGNED:   Hughie Closs, MD  [facility] 11/26/2023, 9:45 AM *Please note that this is a verbal dictation therefore any spelling or grammatical errors are due to the "Dragon [employer] One" system interpretation. If 7PM-7AM, please contact night-coverage [email or web address]

## 2023-11-27 ENCOUNTER — Other Ambulatory Visit: Payer: Self-pay | Admitting: Surgery

## 2023-11-27 DIAGNOSIS — K5720 Diverticulitis of large intestine with perforation and abscess without bleeding: Secondary | ICD-10-CM

## 2023-11-29 LAB — AEROBIC/ANAEROBIC CULTURE W GRAM STAIN (SURGICAL/DEEP WOUND)

## 2023-12-01 ENCOUNTER — Other Ambulatory Visit (HOSPITAL_COMMUNITY): Payer: Self-pay

## 2023-12-10 ENCOUNTER — Ambulatory Visit
Admission: RE | Admit: 2023-12-10 | Discharge: 2023-12-10 | Disposition: A | Discharge status: Home / Self Care | Source: Ambulatory Visit | Attending: Surgery | Admitting: Surgery

## 2023-12-10 ENCOUNTER — Other Ambulatory Visit: Payer: Self-pay | Admitting: Nurse Practitioner

## 2023-12-10 ENCOUNTER — Ambulatory Visit
Admission: RE | Admit: 2023-12-10 | Discharge: 2023-12-10 | Disposition: A | Discharge status: Home / Self Care | Source: Ambulatory Visit | Attending: Nurse Practitioner | Admitting: Nurse Practitioner

## 2023-12-10 ENCOUNTER — Ambulatory Visit
Admission: RE | Admit: 2023-12-10 | Discharge: 2023-12-10 | Disposition: A | Discharge status: Home / Self Care | Source: Ambulatory Visit | Attending: Radiology

## 2023-12-10 DIAGNOSIS — K5720 Diverticulitis of large intestine with perforation and abscess without bleeding: Secondary | ICD-10-CM

## 2023-12-10 DIAGNOSIS — R1030 Lower abdominal pain, unspecified: Secondary | ICD-10-CM

## 2023-12-10 HISTORY — PX: IR CATHETER TUBE CHANGE: IMG717

## 2023-12-10 HISTORY — PX: IR RADIOLOGIST EVAL & MGMT: IMG5224

## 2023-12-10 MED ORDER — LIDOCAINE HCL (PF) 1 % IJ SOLN
10.0000 mL | Freq: Once | INTRAMUSCULAR | Status: AC
  Administered 2023-12-10: 10 mL via INTRADERMAL

## 2023-12-10 MED ORDER — IOPAMIDOL (ISOVUE-300) INJECTION 61%
10.0000 mL | Freq: Once | INTRAVENOUS | Status: AC | PRN
  Administered 2023-12-10: 10 mL

## 2023-12-10 MED ORDER — IOPAMIDOL (ISOVUE-300) INJECTION 61%
100.0000 mL | Freq: Once | INTRAVENOUS | Status: AC | PRN
  Administered 2023-12-10: 100 mL via INTRAVENOUS

## 2023-12-10 NOTE — Progress Notes (Signed)
 Reason for visit: Drain follow up. Referred by Pommier,Wyatt H PA   Care Team(s) Primary Care: Zorita Hiss, NP General Surgery: Wilhelm Hansen MD  History of present illness:  Faith Evans is a 43 y/o F w Hx of HTN, GERD, anemia, HELP Syndrome. Pt was treated for acute diverticulitis at PCP office and was on PO antibiotics. She presented to ER on 11/23/23 w LLQ abdominal pain. CT revealed a 5.2 cm thick walled rim enhancing collection of gas and fluid in the left pelvis. 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 Myrlene Asper. She was discharged on POD 2 and presents today for initial drain clinic follow up. She reports to feel well and denies F/C, N/V. Her drain output has decreased however.    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   Fatty liver    GERD (gastroesophageal reflux disease)    History of hemolysis, elevated liver enzymes, and low platelet (HELLP) syndrome 2007   had pre-eclampsia and Toxemia   Hypertension    Pneumonia     Past Surgical History:  Procedure Laterality Date   CESAREAN SECTION  2007   emergent d/t HELLP, Toxemia   CHOLECYSTECTOMY N/A 01/22/2023   Procedure: LAPAROSCOPIC CHOLECYSTECTOMY WITH ICG DYE;  Surgeon: Adalberto Acton, MD;  Location: [facility];  Service: General;  Laterality: N/A;   IR CATHETER TUBE CHANGE  12/10/2023   IR RADIOLOGIST EVAL & MGMT  12/10/2023   NO PAST SURGERIES     WISDOM TOOTH EXTRACTION      Allergies: Amoxicillin and Cinnamon  Medications: Prior to Admission medications   Medication Sig Start Date End Date Taking? Authorizing Provider  acetaminophen  (TYLENOL ) 500 MG tablet Take 1,000 mg by mouth every 6 (six) hours as needed for moderate  pain (pain score 4-6).    [provider]  HYDROcodone -acetaminophen  (NORCO) 7.5-325 MG tablet Take 1 tablet by mouth every 6 (six) hours as needed for moderate pain (pain score 4-6). 11/19/23   Gray, Sarah E, NP  ibuprofen (ADVIL) 200 MG tablet Take 400 mg by mouth every 6 (six) hours as needed for moderate pain (pain score 4-6).    [provider]  levofloxacin  (LEVAQUIN ) 750 MG tablet Take 1 tablet (750 mg total) by mouth daily for 10 days 11/26/23     sodium chloride  flush (NS) 0.9 % SOLN Flush drain every day with 5 mL normal saline. 11/26/23   Pommier, Wyatt H, PA-C  triamcinolone  cream (KENALOG ) 0.1 % Apply 1 application. topically 2 (two) times daily. To affected area till better Patient taking differently: Apply 1 application  topically daily as needed (Dermatitis). 11/21/21   Ann Keto, MD     Family History  Problem Relation Age of Onset   Cancer Mother    Seizures Father    Cancer Maternal Grandfather    Colon cancer Neg Hx    Rectal cancer Neg Hx    Stomach cancer Neg Hx    Esophageal cancer Neg Hx     Social History   Socioeconomic History   Marital status: Married    Spouse name: Not on file   Number of children: Not on file   Years  of education: Not on file   Highest education level: 12th grade  Occupational History   Not on file  Tobacco Use   Smoking status: Never   Smokeless tobacco: Never  Vaping Use   Vaping status: Never Used  Substance and Sexual Activity   Alcohol use: Yes    Comment: occasionally   Drug use: Never   Sexual activity: Yes  Other Topics Concern   Not on file  Social History Narrative   Not on file   Social Drivers of Health   Financial Resource Strain: Low Risk  (11/19/2023)   Overall Financial Resource Strain (CARDIA)    Difficulty of Paying Living Expenses: Not hard at all  Food Insecurity: No Food Insecurity (11/23/2023)   Hunger Vital Sign    Worried About Running Out of Food in the Last Year: Never true     Ran Out of Food in the Last Year: Never true  Transportation Needs: No Transportation Needs (11/23/2023)   PRAPARE - Administrator, Civil Service (Medical): No    Lack of Transportation (Non-Medical): No  Physical Activity: Insufficiently Active (11/19/2023)   Exercise Vital Sign    Days of Exercise per Week: 3 days    Minutes of Exercise per Session: 10 min  Stress: No Stress Concern Present (11/19/2023)   [employer] of Occupational Health - Occupational Stress Questionnaire    Feeling of Stress : Not at all  Social Connections: Socially Isolated (11/19/2023)   Social Connection and Isolation Panel [NHANES]    Frequency of Communication with Friends and Family: Once a week    Frequency of Social Gatherings with Friends and Family: Once a week    Attends Religious Services: Never    Database administrator or Organizations: No    Attends Engineer, structural: Not on file    Marital Status: Married     Vital Signs: BP (!) 161/101   Pulse 90   Temp 98.6 F (37 C) (Oral)   Resp (!) 24   SpO2 98%   Physical Exam   General: WN, NAD. Anxious CV: RRR on monitor Pulm: normal work of breathing on RA Abd: S, ND, NT. LLQ drain site c/d/i MSK: Grossly normal Psych: Appropriate affect.  Imaging:    IR CT, 11/24/23 IMPRESSION:  CT-guided percutaneous catheter drainage pelvic diverticular abscess.  US  Pelvis, 11/23/23 IMPRESSION:  1. 5.0 x 4.4 x 4.9 cm heterogeneous collection in the left adnexa is consistent with an abscess as described on the CT scan. 2. The left ovary is not visualized separate from the abscess. Ovarian etiology is not excluded. This still most likely represents a diverticular abscess.  CT AP, 11/23/23 IMPRESSION:  1. 5.2 x 4.6 x 4.7 cm thick walled rim enhancing collection of gas and fluid in the left pelvis is compatible with an abscess.  This is contiguous with the left posterolateral wall of the uterus, the inferior wall of the sigmoid  colon, and the left gonadal vein. 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  No results found.  Labs:  CBC: Recent Labs    11/23/23 0751 11/24/23 0622 11/25/23 0546 11/26/23 0622  WBC 25.2* 20.7* 16.7* 13.0*  HGB 12.9 12.0 11.5* 12.9  HCT 38.2 35.9* 34.2* 38.9  PLT 418* 370  369 360    COAGS: Recent Labs    11/24/23 0622  INR 1.1    BMP: Recent Labs    01/14/23 0834 03/06/23 1417 11/19/23 1525 11/23/23 0751 11/24/23 0622 11/25/23 0546  NA 139   < > 135 137 137 137  K 3.7   < > 3.2* 3.6 3.2* 3.6  CL 105   < > 97 100 103 103  CO2 26   < > 31 28 25 25   GLUCOSE 97   < > 105* 98 99 97  BUN 13   < > 13 8 5* 5*  CALCIUM 8.7*   < > 9.3 8.8* 7.8* 7.8*  CREATININE 0.72   < > 0.60 0.52 0.56 0.54  GFRNONAA >60  --   --  >60 >60 >60   < > = values in this interval not displayed.    Assessment and Plan:  Faith Evans is a 43 y/o F w Hx of HTN, GERD, anemia, HELP Syndrome. Pt was treated for acute diverticulitis at PCP office and was on PO antibiotics. She presented to ER on 11/23/23 w LLQ abdominal pain. CT revealed a 5.2 cm thick walled rim enhancing collection of gas and fluid in the left pelvis. 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 Myrlene Asper. She was discharged on POD 2 and presents today for initial drain clinic follow up. She reports to feel well and denies F/C, N/V. Her drain output has decreased however.   CT AP without resolution of collection. Drain injection with incomplete filling of the abscess  *Pt recommended for drain evaluation and exchange, with possible repositioning.   *continue drain to suction with low volume flushes to keep drain open *RTC for drain follow up in 7 - 10 days for re-evaluation w CT.  *General Surgery follow up for diverticulitis   Electronically Signed:  Art Largo, MD [facility]   Pager. [phone] Clinic. [phone]   I spent a total of 15 Minutes in face to face in clinical consultation, greater than 50% of which was counseling/coordinating care for drain evaluation and management

## 2023-12-11 ENCOUNTER — Other Ambulatory Visit: Payer: Self-pay | Admitting: Surgery

## 2023-12-11 DIAGNOSIS — K5720 Diverticulitis of large intestine with perforation and abscess without bleeding: Secondary | ICD-10-CM

## 2023-12-17 NOTE — Progress Notes (Signed)
 Referring Physician(s): Anda Bamberg  Chief Complaint: The patient is seen in follow up today s/p diverticular abscess drain placed 11/24/23; exchange/upsize/reposition 12/10/23  History of present illness:  Faith Evans, 43 year old female, has a medical history significant for HTN, anxiety and diverticulitis. She presented to the [facility] ED 11/23/22 with urinary retention and LLQ abdominal pain. Imaging showed diverticular disease and a thick-walled rim enhancing collection of gas and fluid in the left pelvis compatible with an abscess. The abscess was contiguous with the uterus, sigmoid colon and left gonadal vein. 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   Interventional Radiology was consulted for aspiration/drain placement and this was performed 11/24/23. She was discharged from the hospital 11/26/23 with outpatient IR and Surgery follow ups. She was evaluated at the IR outpatient clinic 12/10/23 and CT imaging showed mild interval decrease in the size of the collection and the tip of the catheter was at the margin. Contrast injection was negative for a fistulous communication with the bowel. She underwent drain exchange/upsize and attempted repositioning of the catheter.   She returns to the clinic today for repeating imaging studies with drain evaluation. She has not yet followed up with her surgeon. She reports minimal output from her drain. She denies pain, nausea, vomiting or diarrhea.    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   Fatty liver    GERD (gastroesophageal reflux disease)    History of hemolysis, elevated liver enzymes, and low platelet (HELLP) syndrome 2007   had pre-eclampsia and Toxemia   Hypertension    Pneumonia     Past Surgical History:  Procedure  Laterality Date   CESAREAN SECTION  2007   emergent d/t HELLP, Toxemia   CHOLECYSTECTOMY N/A 01/22/2023   Procedure: LAPAROSCOPIC CHOLECYSTECTOMY WITH ICG DYE;  Surgeon: Adalberto Acton, MD;  Location: [facility];  Service: General;  Laterality: N/A;   IR CATHETER TUBE CHANGE  12/10/2023   IR RADIOLOGIST EVAL & MGMT  12/10/2023   NO PAST SURGERIES     WISDOM TOOTH EXTRACTION      Allergies: Amoxicillin and Cinnamon  Medications: Prior to Admission medications   Medication Sig Start Date End Date Taking? Authorizing Provider  acetaminophen  (TYLENOL ) 500 MG tablet Take 1,000 mg by mouth every 6 (six) hours as needed for moderate pain (pain score 4-6).    [provider]  HYDROcodone -acetaminophen  (NORCO) 7.5-325 MG tablet Take 1 tablet by mouth every 6 (six) hours as needed for moderate pain (pain score 4-6). 11/19/23   Gray, Sarah E, NP  ibuprofen (ADVIL) 200 MG tablet Take 400 mg by mouth every 6 (six) hours as needed for moderate pain (pain score 4-6).    [provider]  levofloxacin  (LEVAQUIN ) 750 MG tablet Take 1 tablet (750 mg total) by mouth daily for 10 days 11/26/23     sodium chloride  flush (NS) 0.9 % SOLN Flush drain every day with 5 mL normal saline. 11/26/23   Pommier, Wyatt H, PA-C  triamcinolone  cream (KENALOG ) 0.1 % Apply 1 application. topically 2 (two) times daily. To affected area till better Patient taking differently: Apply 1 application  topically daily as needed (Dermatitis). 11/21/21   Ann Keto, MD     Family History  Problem Relation Age of  Onset   Cancer Mother    Seizures Father    Cancer Maternal Grandfather    Colon cancer Neg Hx    Rectal cancer Neg Hx    Stomach cancer Neg Hx    Esophageal cancer Neg Hx     Social History   Socioeconomic History   Marital status: Married    Spouse name: Not on file   Number of children: Not on file   Years of education: Not on file   Highest education level: 12th grade  Occupational History    Not on file  Tobacco Use   Smoking status: Never   Smokeless tobacco: Never  Vaping Use   Vaping status: Never Used  Substance and Sexual Activity   Alcohol use: Yes    Comment: occasionally   Drug use: Never   Sexual activity: Yes  Other Topics Concern   Not on file  Social History Narrative   Not on file   Social Drivers of Health   Financial Resource Strain: Low Risk  (11/19/2023)   Overall Financial Resource Strain (CARDIA)    Difficulty of Paying Living Expenses: Not hard at all  Food Insecurity: No Food Insecurity (11/23/2023)   Hunger Vital Sign    Worried About Running Out of Food in the Last Year: Never true    Ran Out of Food in the Last Year: Never true  Transportation Needs: No Transportation Needs (11/23/2023)   PRAPARE - Administrator, Civil Service (Medical): No    Lack of Transportation (Non-Medical): No  Physical Activity: Insufficiently Active (11/19/2023)   Exercise Vital Sign    Days of Exercise per Week: 3 days    Minutes of Exercise per Session: 10 min  Stress: No Stress Concern Present (11/19/2023)   [employer] of Occupational Health - Occupational Stress Questionnaire    Feeling of Stress : Not at all  Social Connections: Socially Isolated (11/19/2023)   Social Connection and Isolation Panel [NHANES]    Frequency of Communication with Friends and Family: Once a week    Frequency of Social Gatherings with Friends and Family: Once a week    Attends Religious Services: Never    Database administrator or Organizations: No    Attends Engineer, structural: Not on file    Marital Status: Married     Vital Signs: There were no vitals taken for this visit.  Physical Exam Constitutional:      General: She is not in acute distress.    Appearance: She is not ill-appearing.  HENT:     Mouth/Throat:     Mouth: Mucous membranes are moist.     Pharynx: Oropharynx is clear.  Pulmonary:     Effort: Pulmonary effort is normal.   Abdominal:     Palpations: Abdomen is soft.     Tenderness: There is no abdominal tenderness.  Skin:    General: Skin is warm and dry.  Neurological:     Mental Status: She is alert and oriented to person, place, and time.  Psychiatric:        Mood and Affect: Mood normal.        Behavior: Behavior normal.        Thought Content: Thought content normal.        Judgment: Judgment normal.     Imaging: No results found.  Labs:  CBC: Recent Labs    11/23/23 0751 11/24/23 0622 11/25/23 0546 11/26/23 0622  WBC 25.2* 20.7* 16.7* 13.0*  HGB 12.9 12.0 11.5* 12.9  HCT 38.2 35.9* 34.2* 38.9  PLT 418* 370 369 360    COAGS: Recent Labs    11/24/23 0622  INR 1.1    BMP: Recent Labs    01/14/23 0834 03/06/23 1417 11/19/23 1525 11/23/23 0751 11/24/23 0622 11/25/23 0546  NA 139   < > 135 137 137 137  K 3.7   < > 3.2* 3.6 3.2* 3.6  CL 105   < > 97 100 103 103  CO2 26   < > 31 28 25 25   GLUCOSE 97   < > 105* 98 99 97  BUN 13   < > 13 8 5* 5*  CALCIUM 8.7*   < > 9.3 8.8* 7.8* 7.8*  CREATININE 0.72   < > 0.60 0.52 0.56 0.54  GFRNONAA >60  --   --  >60 >60 >60   < > = values in this interval not displayed.    LIVER FUNCTION TESTS: Recent Labs    10/06/23 1005 11/23/23 0751 11/24/23 0622 11/25/23 0546  BILITOT 0.7 0.4 0.5 0.5  AST 66* 22 14* 11*  ALT 97* 34 28 20  ALKPHOS 100 94 71 60  PROT 7.5 7.3 5.9* 5.6*  ALBUMIN 4.2 3.7 2.4* 2.2*    Assessment:  43 year old female with a history of diverticulitis with abscess. She underwent initial drain placement in IR 11/24/23 with exchange/upsize and attempted repositioning 12/10/23.   CT imaging today shows resolution of the fluid collection. A contrast injection under fluoroscopy was negative for a fistula. The drain was removed. The patient has an appointment with her surgeon in two weeks and she was encouraged to keep that appointment.   Post-drain removal site care discussed. Signs/symptoms of possible abscess  recurrence were also discussed.  Signed: Fawn Hooks, NP 12/19/2023, 10:28 AM   Please refer to Dr. Aleen Huron attestation of this note for management and plan.

## 2023-12-19 ENCOUNTER — Ambulatory Visit
Admission: RE | Admit: 2023-12-19 | Discharge: 2023-12-19 | Disposition: A | Discharge status: Home / Self Care | Source: Ambulatory Visit | Attending: Surgery | Admitting: Surgery

## 2023-12-19 DIAGNOSIS — K5720 Diverticulitis of large intestine with perforation and abscess without bleeding: Secondary | ICD-10-CM

## 2023-12-19 HISTORY — PX: IR RADIOLOGIST EVAL & MGMT: IMG5224

## 2023-12-19 MED ORDER — IOPAMIDOL (ISOVUE-300) INJECTION 61%
100.0000 mL | Freq: Once | INTRAVENOUS | Status: AC | PRN
  Administered 2023-12-19: 100 mL via INTRAVENOUS

## 2023-12-25 ENCOUNTER — Ambulatory Visit: Payer: Self-pay

## 2023-12-25 NOTE — Telephone Encounter (Signed)
 Copied from CRM [id number]. Topic: Clinical - Red Word Triage >> Dec 25, 2023  2:24 PM Chuck Crater wrote: Reason for CRM: Patient has poison ivy on breast and is spreading to abdomen and wants to know if pcp can call her in ointment and a sterioid before it spreads. She said that she is itching and has red spots and the others are starting to open/ooze.   Chief Complaint: Poison Ivy Rash  Symptoms: Itching  Frequency: Since Monday  Pertinent Negatives: Patient denies dyspnea, chest pain  Disposition: [] ED /[] Urgent Care (no appt availability in office) / [x] Appointment(In office/virtual)/ []  [facility]/ [] Home Care/ [] Refused Recommended Disposition /[] [facility]/ []  Follow-up with PCP Additional Notes: CD is being triaged for a poison ivy rash that is on her breast that is spreading to her abdomen. The patient reports having a reaction to poison ivy in the past. Scheduled virtual visit with PCP and recommended UC if symptoms worsened at all. Verbalized understanding and agreed to disposition.   Reason for Disposition  Severe poison ivy, oak, or sumac reaction in the past  Answer Assessment - Initial Assessment Questions 1. APPEARANCE of RASH: "Describe the rash."      Oozing spots, reddened, welts  2. LOCATION: "Where is the rash located?"  (e.g., face, genitals, hands, legs)     Across both breasts, spreading down to the abdomen  3. SIZE: "How large is the rash?"      Across both breasts and under them  4. ONSET: "When did the rash begin?"      Monday  5. ITCHING: "Does the rash itch?" If Yes, ask: "How bad is it?"   - MILD - doesn't interfere with normal activities   - MODERATE-SEVERE: interferes with work, school, sleep, or other activities      Moderate  6. EXPOSURE:  "How were you exposed to the plant (poison ivy, poison oak, sumac)"  "When were you exposed?"       Via her husband whom unknowingly exposed her to it.  7. PAST HISTORY: "Have you had a  poison ivy rash before?" If Yes, ask: "How bad was it?"     Yes, allergic reaction  8. PREGNANCY: "Is there any chance you are pregnant?" "When was your last menstrual period?"     No and No  Protocols used: Poison Ivy - Oak - Sumac-A-AH

## 2023-12-26 ENCOUNTER — Telehealth (INDEPENDENT_AMBULATORY_CARE_PROVIDER_SITE_OTHER): Admitting: Nurse Practitioner

## 2023-12-26 DIAGNOSIS — L237 Allergic contact dermatitis due to plants, except food: Secondary | ICD-10-CM

## 2023-12-26 MED ORDER — PREDNISONE 20 MG PO TABS: 40.0000 mg | ORAL_TABLET | Freq: Every day | ORAL | 0 refills | Status: DC

## 2023-12-26 MED ORDER — CLOTRIMAZOLE-BETAMETHASONE 1-0.05 % EX CREA: 1.0000 | TOPICAL_CREAM | Freq: Every day | CUTANEOUS | 0 refills | Status: DC

## 2023-12-26 NOTE — Progress Notes (Signed)
 Established Patient Office Visit  An audio/visual tele-health visit was completed today for this patient. I connected with  Faith Evans on 12/26/23 utilizing audio/visual technology and verified that I am speaking with the correct person using two identifiers. The patient was located at their place of employment, and I was located at the office of [facility] at [facility] during the encounter. I discussed the limitations of evaluation and management by telemedicine. The patient expressed understanding and agreed to proceed.     Subjective   Patient ID: Faith Evans, female    DOB: [birth year]  Age: 43 y.o. MRN: [id number]  Chief Complaint  Patient presents with   Poison Ivy    Patient was today for virtual visit for the above. She reports that she has a rash under her breast and on her abdomen that she believes is contact dermatitis from poison ivy exposure.  She feels that the location of the rash is also quite moist and this moisture is contributing to worsening of the rash.  She reports she has had poison ivy rashes in the past and this is consistent with what she has had in the past.  She usually will treat with Tecnu scrub as well as her triamcinolone  cream.  Reports that historically if she cannot keep rash under control with these measures she ends up needing steroid by mouth.  She reports rash is continuing to spread down her abdomen and she does have some areas that are oozing.   Of note, patient was hospitalized last month for diverticular abscess.  She has drain placed and this has been removed.  She saw general surgeon yesterday for follow-up.  At that time all evidence supported complete resolution of abscess, and drainage site had completely healed.    ROS: see HPI    Objective:     There were no vitals taken for this visit.   Physical Exam Comprehensive physical exam not completed today as office visit was conducted remotely.  Patient appears well over  video, unfortunately she is at work so unable to show me rash due to it being in a more sensitive area of her body.  Patient was alert and oriented, and appeared to have appropriate judgment.   No results found for any visits on 12/26/23.    The 10-year ASCVD risk score (Arnett DK, et al., 2019) is: 1.4%    Assessment & Plan:   Problem List Items Addressed This Visit       Musculoskeletal and Integument   Poison ivy - Primary   Acute Has tried and failed triamcinolone  as well as tecnu wash. Concern for possible fungal coinfection.  Treat with Lotrisone cream application daily.  If symptoms persist or worsen despite Lotrisone application can treat with prednisone  40 mg daily with breakfast x 5 days.  Because patient recently had intra-abdominal drain, we discussed in detail risk of GI bleeding with prednisone .  She was told to make sure to take this with food, and educated on signs and symptoms of GI bleed.  She was told if any of these symptoms occur she should stop the prednisone  immediately and seek medical evaluation.  We also discussed that if she needs anything for pain management while on prednisone  she can take Tylenol  only and should avoid NSAIDs.  She reports understanding.      Relevant Medications   clotrimazole-betamethasone (LOTRISONE) cream   predniSONE  (DELTASONE ) 20 MG tablet    Return if symptoms worsen or fail to  improve.    Zorita Hiss, NP

## 2023-12-26 NOTE — Assessment & Plan Note (Signed)
 Acute Has tried and failed triamcinolone  as well as tecnu wash. Concern for possible fungal coinfection.  Treat with Lotrisone cream application daily.  If symptoms persist or worsen despite Lotrisone application can treat with prednisone  40 mg daily with breakfast x 5 days.  Because patient recently had intra-abdominal drain, we discussed in detail risk of GI bleeding with prednisone .  She was told to make sure to take this with food, and educated on signs and symptoms of GI bleed.  She was told if any of these symptoms occur she should stop the prednisone  immediately and seek medical evaluation.  We also discussed that if she needs anything for pain management while on prednisone  she can take Tylenol  only and should avoid NSAIDs.  She reports understanding.

## 2023-12-29 ENCOUNTER — Other Ambulatory Visit: Payer: Self-pay

## 2023-12-29 ENCOUNTER — Ambulatory Visit: Admitting: Internal Medicine

## 2023-12-29 ENCOUNTER — Encounter: Payer: Self-pay | Admitting: Internal Medicine

## 2023-12-29 ENCOUNTER — Ambulatory Visit: Payer: Self-pay

## 2023-12-29 VITALS — BP 136/84 | HR 87 | Temp 98.8°F | Ht <= 58 in | Wt 153.0 lb

## 2023-12-29 DIAGNOSIS — K5720 Diverticulitis of large intestine with perforation and abscess without bleeding: Secondary | ICD-10-CM

## 2023-12-29 NOTE — Telephone Encounter (Signed)
 Copied from CRM [id number]. Topic: Clinical - Red Word Triage >> Dec 29, 2023  7:49 AM Hamdi H wrote: Red Word that prompted transfer to Nurse Triage: Bloating, pressure, and pain in abdomen for the past few days.    Chief Complaint: Left lower abdominal pain with mild constipation. Symptoms: Bloating, pressure, pain 3/10 Frequency: 2 days Pertinent Negatives: Patient denies fever Disposition: [] ED /[] Urgent Care (no appt availability in office) / [x] Appointment(In office/virtual)/ []  Door Virtual Care/ [] Home Care/ [] Refused Recommended Disposition /[] [place] [facility]/ []  Follow-up with PCP Additional Notes: Agrees with appointment.  Reason for Disposition  [1] MILD-MODERATE pain AND [2] constant AND [3] present > 2 hours  Answer Assessment - Initial Assessment Questions 1. LOCATION: "Where does it hurt?"      Lower left 2. RADIATION: "Does the pain shoot anywhere else?" (e.g., chest, back)     No 3. ONSET: "When did the pain begin?" (e.g., minutes, hours or days ago)      A few days 4. SUDDEN: "Gradual or sudden onset?"     Gradual 5. PATTERN "Does the pain come and go, or is it constant?"    - If it comes and goes: "How long does it last?" "Do you have pain now?"     (Note: Comes and goes means the pain is intermittent. It goes away completely between bouts.)    - If constant: "Is it getting better, staying the same, or getting worse?"      (Note: Constant means the pain never goes away completely; most serious pain is constant and gets worse.)      Comes and goes 6. SEVERITY: "How bad is the pain?"  (e.g., Scale 1-10; mild, moderate, or severe)    - MILD (1-3): Doesn't interfere with normal activities, abdomen soft and not tender to touch.     - MODERATE (4-7): Interferes with normal activities or awakens from sleep, abdomen tender to touch.     - SEVERE (8-10): Excruciating pain, doubled over, unable to do any normal activities.       2 7. RECURRENT SYMPTOM: "Have  you ever had this type of stomach pain before?" If Yes, ask: "When was the last time?" and "What happened that time?"      yes 8. CAUSE: "What do you think is causing the stomach pain?"     unsure 9. RELIEVING/AGGRAVATING FACTORS: "What makes it better or worse?" (e.g., antacids, bending or twisting motion, bowel movement)     no 10. OTHER SYMPTOMS: "Do you have any other symptoms?" (e.g., back pain, diarrhea, fever, urination pain, vomiting)       Constipation 11. PREGNANCY: "Is there any chance you are pregnant?" "When was your last menstrual period?"       no  Protocols used: Abdominal Pain - [facility]

## 2023-12-29 NOTE — Patient Instructions (Addendum)
   Recent diverticulitis with abscess last month s/p removal of drain 10 days ago - increasing LLQ pain and constipation  - concern for recurrence of abscess     A CT abdomen/pelvis  was ordered for [facility] and someone will call you to schedule an appointment.

## 2023-12-29 NOTE — Progress Notes (Signed)
 Subjective:    Patient ID: Faith Evans, female    DOB: 1981-02-08, 43 y.o.   MRN: [id number]      HPI Faith Evans is here for  Chief Complaint  Patient presents with   Abdominal Pain    Not going as often as she should with BM; not going frequent and very little; Last Monday she went a lot she only went a little bit on Tuesday and since then not her normal. Some pain noted in lower abdomen; Patient took probiotics and Metamucil recently on Saturday.    Abdominal pain x couple of days.  Constipation x 1 week.  Her abdominal pain is in the left lower quadrant and is intermittent.  She has been having regular stools every day-1 or 2 bowel movements a day, but there is minimal stool.  She denies any blood in the stool.  She denies any fever.  Her appetite is good.  She denies any urine symptoms, but may have a little bit of frequency or feeling like her bladder is full.   4/6 - colonoic diveritcular abscess and was hospitalized- 10 days ago the drain was taken out.  When the drain was taken out CT scan showed complete resolution of the abscess.   Few days ago she started taking metamucil, probiotics.  No change in bowels.   Medications and allergies reviewed with patient and updated if appropriate.  Current Outpatient Medications on File Prior to Visit  Medication Sig Dispense Refill   acetaminophen  (TYLENOL ) 500 MG tablet Take 1,000 mg by mouth every 6 (six) hours as needed for moderate pain (pain score 4-6).     clotrimazole-betamethasone (LOTRISONE) cream Apply 1 Application topically daily. 45 g 0   HYDROcodone -acetaminophen  (NORCO) 7.5-325 MG tablet Take 1 tablet by mouth every 6 (six) hours as needed for moderate pain (pain score 4-6). 20 tablet 0   ibuprofen (ADVIL) 200 MG tablet Take 400 mg by mouth every 6 (six) hours as needed for moderate pain (pain score 4-6).     levofloxacin  (LEVAQUIN ) 750 MG tablet Take 1 tablet (750 mg total) by mouth daily for 10 days 10 tablet 0    predniSONE  (DELTASONE ) 20 MG tablet Take 2 tablets (40 mg total) by mouth daily with breakfast. 10 tablet 0   sodium chloride  flush (NS) 0.9 % SOLN Flush drain every day with 5 mL normal saline. 300 mL 3   triamcinolone  cream (KENALOG ) 0.1 % Apply 1 application. topically 2 (two) times daily. To affected area till better (Patient taking differently: Apply 1 application  topically daily as needed (Dermatitis).) 80 g 0   No current facility-administered medications on file prior to visit.    Review of Systems  Constitutional:  Negative for appetite change and fever.  Gastrointestinal:  Positive for abdominal pain (LLQ) and constipation. Negative for blood in stool (no melena), diarrhea and nausea.  Genitourinary:  Positive for frequency. Negative for dysuria, hematuria and urgency.  Neurological:  Positive for headaches (waking up with them x 1 week). Negative for light-headedness.       Objective:   Vitals:   12/29/23 1548  BP: 136/84  Pulse: 87  Temp: 98.8 F (37.1 C)  SpO2: 95%   BP Readings from Last 3 Encounters:  12/29/23 136/84  12/10/23 (!) 161/101  11/26/23 (!) 134/91   Wt Readings from Last 3 Encounters:  12/29/23 153 lb (69.4 kg)  11/26/23 153 lb 8 oz (69.6 kg)  11/19/23 153 lb (69.4 kg)  Body mass index is 31.98 kg/m.    Physical Exam Constitutional:      General: She is not in acute distress.    Appearance: Normal appearance. She is well-developed. She is not ill-appearing.  HENT:     Head: Normocephalic and atraumatic.  Abdominal:     General: There is no distension.     Palpations: Abdomen is soft. There is no mass.     Tenderness: There is abdominal tenderness (Left lower quad). There is no guarding or rebound.     Hernia: No hernia is present.  Skin:    General: Skin is warm and dry.     Findings: No rash.  Neurological:     Mental Status: She is alert.            Assessment & Plan:   Left lower quadrant pain,  constipation: Acute Constipation x 1 week, left lower quadrant pain x 2 days Recent-1 month ago-episode of diverticulitis with abscess Drain removed about 10 days ago Shortly after drain removal removed symptoms started Concern for recurrence of abscess Stat CT abdomen and pelvis-she will actually go to the emergency room for this  Further evaluation for emergency room   I spent 20 minutes dedicated to the care of this patient on the date of this encounter including review of recent labs, imaging and procedures, speciality notes, obtaining history, communicating with the patient, ordering CT scan, and documenting clinical information in the EHR

## 2023-12-30 ENCOUNTER — Other Ambulatory Visit: Payer: Self-pay

## 2023-12-30 ENCOUNTER — Inpatient Hospital Stay (HOSPITAL_COMMUNITY)
Admission: EM | Admit: 2023-12-30 | Discharge: 2024-01-02 | DRG: 391 | Disposition: A | Discharge status: Home / Self Care | Attending: Internal Medicine | Admitting: Internal Medicine

## 2023-12-30 ENCOUNTER — Other Ambulatory Visit: Payer: Self-pay | Admitting: Student

## 2023-12-30 ENCOUNTER — Encounter (HOSPITAL_COMMUNITY): Payer: Self-pay | Admitting: Emergency Medicine

## 2023-12-30 ENCOUNTER — Ambulatory Visit
Admission: RE | Admit: 2023-12-30 | Discharge: 2023-12-30 | Disposition: A | Discharge status: Home / Self Care | Source: Ambulatory Visit | Attending: Student | Admitting: Student

## 2023-12-30 DIAGNOSIS — K5732 Diverticulitis of large intestine without perforation or abscess without bleeding: Secondary | ICD-10-CM | POA: Diagnosis present

## 2023-12-30 DIAGNOSIS — K5720 Diverticulitis of large intestine with perforation and abscess without bleeding: Secondary | ICD-10-CM | POA: Insufficient documentation

## 2023-12-30 DIAGNOSIS — L0291 Cutaneous abscess, unspecified: Secondary | ICD-10-CM

## 2023-12-30 DIAGNOSIS — I10 Essential (primary) hypertension: Secondary | ICD-10-CM | POA: Diagnosis present

## 2023-12-30 DIAGNOSIS — K5792 Diverticulitis of intestine, part unspecified, without perforation or abscess without bleeding: Secondary | ICD-10-CM | POA: Diagnosis not present

## 2023-12-30 DIAGNOSIS — K651 Peritoneal abscess: Principal | ICD-10-CM | POA: Diagnosis present

## 2023-12-30 LAB — URINALYSIS, W/ REFLEX TO CULTURE (INFECTION SUSPECTED)
Bacteria, UA: NONE SEEN
Bilirubin Urine: NEGATIVE
Glucose, UA: NEGATIVE mg/dL
Hgb urine dipstick: NEGATIVE
Ketones, ur: NEGATIVE mg/dL
Leukocytes,Ua: NEGATIVE
Nitrite: NEGATIVE
Protein, ur: NEGATIVE mg/dL
Specific Gravity, Urine: 1.031 — ABNORMAL HIGH (ref 1.005–1.030)
pH: 6 (ref 5.0–8.0)

## 2023-12-30 LAB — COMPREHENSIVE METABOLIC PANEL WITH GFR
ALT: 31 U/L (ref 0–44)
AST: 24 U/L (ref 15–41)
Albumin: 3.4 g/dL — ABNORMAL LOW (ref 3.5–5.0)
Alkaline Phosphatase: 99 U/L (ref 38–126)
Anion gap: 10 (ref 5–15)
BUN: <5 mg/dL — ABNORMAL LOW (ref 6–20)
CO2: 26 mmol/L (ref 22–32)
Calcium: 9.4 mg/dL (ref 8.9–10.3)
Chloride: 99 mmol/L (ref 98–111)
Creatinine, Ser: 0.53 mg/dL (ref 0.44–1.00)
GFR, Estimated: >60 mL/min (ref >60)
Glucose, Bld: 136 mg/dL — ABNORMAL HIGH (ref 70–99)
Potassium: 2.9 mmol/L — ABNORMAL LOW (ref 3.5–5.1)
Sodium: 135 mmol/L (ref 135–145)
Total Bilirubin: 0.7 mg/dL (ref 0.0–1.2)
Total Protein: 7.8 g/dL (ref 6.5–8.1)

## 2023-12-30 LAB — I-STAT CG4 LACTIC ACID, ED
Lactic Acid, Venous: 1.2 mmol/L (ref 0.5–1.9)
Lactic Acid, Venous: 0.6 mmol/L (ref 0.5–1.9)

## 2023-12-30 LAB — CBC WITH DIFFERENTIAL/PLATELET
Abs Immature Granulocytes: 0.16 10*3/uL — ABNORMAL HIGH (ref 0.00–0.07)
Basophils Absolute: 0.1 10*3/uL (ref 0.0–0.1)
Basophils Relative: 0 %
Eosinophils Absolute: 0 10*3/uL (ref 0.0–0.5)
Eosinophils Relative: 0 %
HCT: 36 % (ref 36.0–46.0)
Hemoglobin: 12.1 g/dL (ref 12.0–15.0)
Immature Granulocytes: 1 %
Lymphocytes Relative: 17 %
Lymphs Abs: 2.8 10*3/uL (ref 0.7–4.0)
MCH: 30.9 pg (ref 26.0–34.0)
MCHC: 33.6 g/dL (ref 30.0–36.0)
MCV: 92.1 fL (ref 80.0–100.0)
Monocytes Absolute: 0.8 10*3/uL (ref 0.1–1.0)
Monocytes Relative: 5 %
Neutro Abs: 12.5 10*3/uL — ABNORMAL HIGH (ref 1.7–7.7)
Neutrophils Relative %: 77 %
Platelets: 398 10*3/uL (ref 150–400)
RBC: 3.91 MIL/uL (ref 3.87–5.11)
RDW: 13.8 % (ref 11.5–15.5)
WBC: 16.3 10*3/uL — ABNORMAL HIGH (ref 4.0–10.5)
nRBC: 0 % (ref 0.0–0.2)

## 2023-12-30 LAB — HCG, SERUM, QUALITATIVE: Preg, Serum: NEGATIVE

## 2023-12-30 MED ORDER — PIPERACILLIN-TAZOBACTAM 3.375 G IVPB 30 MIN
3.3750 g | Freq: Once | INTRAVENOUS | Status: AC
  Administered 2023-12-30: 3.375 g via INTRAVENOUS
  Filled 2023-12-30: qty 50

## 2023-12-30 MED ORDER — POTASSIUM CHLORIDE 10 MEQ/100ML IV SOLN
10.0000 meq | Freq: Once | INTRAVENOUS | Status: AC
  Administered 2023-12-30: 10 meq via INTRAVENOUS
  Filled 2023-12-30: qty 100

## 2023-12-30 MED ORDER — IOHEXOL 300 MG/ML  SOLN
85.0000 mL | Freq: Once | INTRAMUSCULAR | Status: AC | PRN
Start: 2023-12-30 — End: 2023-12-30
  Administered 2023-12-30: 85 mL via INTRAVENOUS

## 2023-12-30 MED ORDER — IOHEXOL 9 MG/ML PO SOLN
500.0000 mL | ORAL | Status: AC
  Administered 2023-12-30: 500 mL via ORAL

## 2023-12-30 NOTE — ED Provider Notes (Signed)
 Care assumed from [facility].  At time of transfer care, patient awaiting IR consultation and admission to hospital.  IR called back and requested placing order for IR management so that they can see the patient in the morning for likely drain placement.  Will have the patient remain NPO.  Medicine called and will need for further management of recurrent diverticulitis with abscess recurrence.  Clinical Impression: 1. Intra-abdominal abscess (HCC)     Disposition: Admit  This note was prepared with assistance of Dragon voice recognition software. Occasional wrong-word or sound-a-like substitutions may have occurred due to the inherent limitations of voice recognition software.     Shandria Clinch, Marine Sia, MD 12/30/23 [phone]

## 2023-12-30 NOTE — ED Notes (Signed)
 Getting IV and blood in triage

## 2023-12-30 NOTE — ED Provider Notes (Signed)
 [facility] Provider Note   CSN: [id number] Arrival date & time: 12/30/23  1921     History  Chief Complaint  Patient presents with   sent by provider    Faith Evans is a 43 y.o. female with history of diverticulitis with abscess status post drain placement on 11/24/2023 by Dr. Blinda Burger.  Removed 4/23. Presents with persistent abdominal pain and repeat CT demonstrated recurrence of fluid collection.  Referred here for IV antibiotics and drain placement tomorrow morning.  No vomiting or diarrhea.  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   Fatty liver    GERD (gastroesophageal reflux disease)    History of hemolysis, elevated liver enzymes, and low platelet (HELLP) syndrome 2007   had pre-eclampsia and Toxemia   Hypertension    Pneumonia    Past Surgical History:  Procedure Laterality Date   CESAREAN SECTION  2007   emergent d/t HELLP, Toxemia   CHOLECYSTECTOMY N/A 01/22/2023   Procedure: LAPAROSCOPIC CHOLECYSTECTOMY WITH ICG DYE;  Surgeon: Adalberto Acton, MD;  Location: [facility] ORS;  Service: General;  Laterality: N/A;   IR CATHETER TUBE CHANGE  12/10/2023   IR RADIOLOGIST EVAL & MGMT  12/10/2023   IR RADIOLOGIST EVAL & MGMT  12/19/2023   NO PAST SURGERIES     WISDOM TOOTH EXTRACTION       HPI     Home Medications Prior to Admission medications   Medication Sig Start Date End Date Taking? Authorizing Provider  acetaminophen  (TYLENOL ) 500 MG tablet Take 1,000 mg by mouth every 6 (six) hours as needed for moderate pain (pain score 4-6).    [provider]  clotrimazole-betamethasone (LOTRISONE) cream Apply 1 Application topically daily. 12/26/23   Gray, Sarah E, NP  ibuprofen (ADVIL) 200 MG tablet Take 400 mg by mouth every 6 (six) hours as needed for moderate pain (pain score 4-6).     [provider]  predniSONE  (DELTASONE ) 20 MG tablet Take 2 tablets (40 mg total) by mouth daily with breakfast. 12/26/23   Zorita Hiss, NP  sodium chloride  flush (NS) 0.9 % SOLN Flush drain every day with 5 mL normal saline. 11/26/23   Pommier, Wyatt H, PA-C  triamcinolone  cream (KENALOG ) 0.1 % Apply 1 application. topically 2 (two) times daily. To affected area till better Patient taking differently: Apply 1 application  topically daily as needed (Dermatitis). 11/21/21   Ann Keto, MD      Allergies    Amoxicillin and Cinnamon    Review of Systems   Review of Systems  Physical Exam Updated Vital Signs BP (!) 139/93 (BP Location: Left Arm)   Pulse 98   Temp 99.4 F (37.4 C)   Resp 18   Ht 4\' 10"  (1.473 m)   Wt 69.4 kg   SpO2 100%   BMI 31.98 kg/m  Physical Exam  ED Results / Procedures / Treatments   Labs (all labs ordered are listed, but only abnormal results are displayed) Labs Reviewed  COMPREHENSIVE METABOLIC PANEL WITH GFR - Abnormal; Notable for the following components:      Result Value   Potassium 2.9 (*)    Glucose, Bld 136 (*)    BUN <5 (*)    Albumin 3.4 (*)    All other components  within normal limits  CBC WITH DIFFERENTIAL/PLATELET - Abnormal; Notable for the following components:   WBC 16.3 (*)    Neutro Abs 12.5 (*)    Abs Immature Granulocytes 0.16 (*)    All other components within normal limits  URINALYSIS, W/ REFLEX TO CULTURE (INFECTION SUSPECTED) - Abnormal; Notable for the following components:   Specific Gravity, Urine 1.031 (*)    All other components within normal limits  CULTURE, BLOOD (ROUTINE X 2)  CULTURE, BLOOD (ROUTINE X 2)  HCG, SERUM, QUALITATIVE  I-STAT CG4 LACTIC ACID, ED  I-STAT CG4 LACTIC ACID, ED    EKG None  Radiology CT ABDOMEN PELVIS W CONTRAST Result Date: 12/30/2023 CLINICAL DATA:  Left lower quadrant abdominal pain. History of diverticulitis. EXAM: CT ABDOMEN AND PELVIS WITH CONTRAST TECHNIQUE:  Multidetector CT imaging of the abdomen and pelvis was performed using the standard protocol following bolus administration of intravenous contrast. RADIATION DOSE REDUCTION: This exam was performed according to the departmental dose-optimization program which includes automated exposure control, adjustment of the mA and/or kV according to patient size and/or use of iterative reconstruction technique. CONTRAST:  85mL OMNIPAQUE  IOHEXOL  300 MG/ML  SOLN COMPARISON:  Dec 19, 2023. FINDINGS: Lower chest: No acute abnormality. Hepatobiliary: No focal liver abnormality is seen. Status post cholecystectomy. No biliary dilatation. Pancreas: Unremarkable. No pancreatic ductal dilatation or surrounding inflammatory changes. Spleen: Normal in size without focal abnormality. Adrenals/Urinary Tract: Adrenal glands are unremarkable. Kidneys are normal, without renal calculi, focal lesion, or hydronephrosis. Bladder is unremarkable. Stomach/Bowel: The stomach and appendix are unremarkable. There is no evidence of bowel obstruction. Percutaneous drain noted on prior exam has been removed. There remains mild wall thickening of the sigmoid colon with minimal surrounding inflammatory changes suggesting acute diverticulitis. 5.6 x 3.8 cm fluid collection with air-fluid levels is noted inferior to the sigmoid colon most consistent with abscess. This is been described as adnexal cyst on prior exam, but the presence of air-fluid levels make this more likely to be diverticular abscess. Vascular/Lymphatic: No significant vascular findings are present. No enlarged abdominal or pelvic lymph nodes. Reproductive: Intrauterine device is noted. Other: Small amount of free fluid is noted in the pelvis. No definite hernia is noted. Musculoskeletal: No acute or significant osseous findings. IMPRESSION: Mild to moderate sigmoid diverticulitis is again noted. 5.6 x 3.8 cm fluid collection with air-fluid levels is again noted inferior to the inflamed  segment of sigmoid colon most consistent with paradiverticular abscess given the presence of air-fluid levels. These results will be called to the ordering clinician or representative by the Radiologist Assistant, and communication documented in the PACS or zVision Dashboard. Electronically Signed   By: Rosalene Colon M.D.   On: 12/30/2023 16:33    Procedures Procedures    Medications Ordered in ED Medications  piperacillin-tazobactam (ZOSYN) IVPB 3.375 g (3.375 g Intravenous New Bag/Given 12/30/23 2146)  potassium chloride  10 mEq in 100 mL IVPB (has no administration in time range)    ED Course/ Medical Decision Making/ A&P                                 Medical Decision Making Amount and/or Complexity of Data Reviewed Labs: ordered.   .This patient presents to the ED with chief complaint(s) of abdominal pain .  The complaint involves an extensive differential diagnosis and also carries with it a high risk of complications and morbidity.   Pertinent past medical history  as listed in HPI  Additional history obtained: Additional history obtained from spouse Records reviewed Care Everywhere/External Records  Assessment and management:   Hemodynamically stable, afebrile patient presenting with complaints of left lower quadrant abdominal pain.  She is status post drain placement on 4/7 by IR for diverticulitis with abscess.  Here again today with recent CT demonstrating recurrence of fluid with plan for IR drain placement tomorrow morning.  She has a leukocytosis of 16.3, blood cultures obtained, lactic within normal limits.  Started on Zosyn.  Has a hyponatremia of 2.9, will replenish here in the ED.  Will consult general surgery/IR and admit to hospitalist service  Independent ECG interpretation:  none  Independent labs interpretation:  The following labs were independently interpreted:  UA with elevated specific gravity, negative nitrates, negative leukocytes, CMP with  hyponatremia of 2.9, CBC with leukocytosis of 16.3, hCG negative, lactic within normal limits 1.2 blood cultures pending  Independent visualization and interpretation of imaging: I independently visualized the following imaging with scope of interpretation limited to determining acute life threatening conditions related to emergency care:  Outpatient CT abdomen pelvis today Mild to moderate sigmoid diverticulitis is again noted. 5.6 x 3.8 cm fluid collection with air-fluid levels is again noted inferior to the inflamed segment of sigmoid colon most consistent with paradiverticular abscess given the presence of air-fluid levels. These results will be called to the ordering clinician or representative by the Radiologist Assistant, and communication documented in the PACS or zVision Dashboard.   Consultations obtained:   General surgery Dr. Delane Fear, will consult in the morning.  Recommended IR consult IR Hospitalist  Disposition:   Patient be admitted for further surgical management.  Dr. Manus Sellers is aware of patient.  Hospitalist and IR calls are pending.  Social Determinants of Health:   none  This note was dictated with voice recognition software.  Despite best efforts at proofreading, errors may have occurred which can change the documentation meaning.          Final Clinical Impression(s) / ED Diagnoses Final diagnoses:  Intra-abdominal abscess [facility])    Rx / DC Orders ED Discharge Orders     None         Stanton Earthly 12/30/23 2212    Tegeler, Marine Sia, MD 12/30/23 [phone]

## 2023-12-30 NOTE — Addendum Note (Signed)
 Addended by: Colene Dauphin on: 12/30/2023 12:25 PM   Modules accepted: Orders

## 2023-12-30 NOTE — H&P (Incomplete)
 History and Physical    Patient: Faith Evans UYQ:[id number] DOB: [birth year] DOA: 12/30/2023 DOS: the patient was seen and examined on 12/31/2023 PCP: Zorita Hiss, NP  Patient coming from: Home  Chief Complaint:  Chief Complaint  Patient presents with   sent by provider   HPI: Faith Evans is a 43 y.o. female with medical history significant of diverticulitis complicated by abscess s/p stent placement/removal  4/7-5/2 presenting with worsening left lower quadrant abdominal pain x 2 days with associated decreased stool volume, fever, and chills.  No diarrhea, bloody stools, nausea, or vomiting.  In the emergency department, she was afebrile, hemodynamically stable with a leukocytosis of 16.3.  CT scan of the abdomen revealed diverticulitis with findings consistent with recurrent peridiverticular abscess.  Emergency room physician discussed with on-call general surgery and IR who recommended admission to hospital service for formal consult in the morning.   Review of Systems: Review of Systems  Constitutional:  Positive for chills and fever. Negative for diaphoresis, malaise/fatigue and weight loss.  Eyes:  Negative for blurred vision.  Respiratory:  Negative for cough and shortness of breath.   Cardiovascular:  Negative for chest pain and palpitations.  Gastrointestinal:  Positive for abdominal pain and constipation. Negative for blood in stool, diarrhea, heartburn, melena, nausea and vomiting.  Genitourinary:  Negative for dysuria and frequency.  Musculoskeletal:  Negative for back pain, joint pain, myalgias and neck pain.  Skin:  Negative for rash.  Neurological:  Negative for dizziness and tingling.    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   Fatty liver    GERD (gastroesophageal reflux disease)    History of hemolysis, elevated liver  enzymes, and low platelet (HELLP) syndrome 2007   had pre-eclampsia and Toxemia   Hypertension    Pneumonia    Past Surgical History:  Procedure Laterality Date   CESAREAN SECTION  2007   emergent d/t HELLP, Toxemia   CHOLECYSTECTOMY N/A 01/22/2023   Procedure: LAPAROSCOPIC CHOLECYSTECTOMY WITH ICG DYE;  Surgeon: Adalberto Acton, MD;  Location: [facility];  Service: General;  Laterality: N/A;   IR CATHETER TUBE CHANGE  12/10/2023   IR RADIOLOGIST EVAL & MGMT  12/10/2023   IR RADIOLOGIST EVAL & MGMT  12/19/2023   NO PAST SURGERIES     WISDOM TOOTH EXTRACTION     Social History:  reports that she has never smoked. She has never used smokeless tobacco. She reports current alcohol use. She reports that she does not use drugs.  Allergies  Allergen Reactions   Amoxicillin Rash   Cinnamon Rash    Family History  Problem Relation Age of Onset   Cancer Mother    Seizures Father    Cancer Maternal Grandfather    Colon cancer Neg Hx    Rectal cancer Neg Hx    Stomach cancer Neg Hx    Esophageal cancer Neg Hx     Prior to Admission medications   Medication Sig Start Date End Date Taking? Authorizing Provider  acetaminophen  (TYLENOL ) 500 MG tablet Take 1,000 mg by mouth every 6 (six) hours as needed for moderate pain (pain score 4-6).   Yes [provider]  clotrimazole-betamethasone (LOTRISONE) cream Apply 1 Application topically daily. 12/26/23  Yes Zorita Hiss, NP  predniSONE  (DELTASONE ) 20 MG tablet Take 2 tablets (  40 mg total) by mouth daily with breakfast. Patient not taking: Reported on 12/30/2023 12/26/23   Zorita Hiss, NP  sodium chloride  flush (NS) 0.9 % SOLN Flush drain every day with 5 mL normal saline. Patient not taking: Reported on 12/30/2023 11/26/23   Pommier, Wyatt H, PA-C  triamcinolone  cream (KENALOG ) 0.1 % Apply 1 application. topically 2 (two) times daily. To affected area till better Patient not taking: Reported on 12/30/2023 11/21/21   Ann Keto, MD     Physical Exam: Vitals:   12/30/23 1933 12/30/23 1949 12/31/23 0010  BP: (!) 139/93  (!) 149/92  Pulse: 98  87  Resp: 18  16  Temp: 99.4 F (37.4 C)  98.5 F (36.9 C)  TempSrc:   Oral  SpO2: 100%  99%  Weight:  69.4 kg   Height:  4\' 10"  (1.473 m)    Physical Activity: Insufficiently Active (11/19/2023)   Exercise Vital Sign    Days of Exercise per Week: 3 days    Minutes of Exercise per Session: 10 min   Physical Exam Vitals and nursing note reviewed.  Constitutional:      General: She is not in acute distress.    Appearance: She is not ill-appearing or toxic-appearing.  HENT:     Head: Normocephalic and atraumatic.  Eyes:     Extraocular Movements: Extraocular movements intact.     Pupils: Pupils are equal, round, and reactive to light.  Cardiovascular:     Rate and Rhythm: Normal rate and regular rhythm.  Pulmonary:     Effort: Pulmonary effort is normal. No respiratory distress.     Breath sounds: Normal breath sounds.  Abdominal:     General: There is no distension.     Palpations: Abdomen is soft.     Tenderness: There is abdominal tenderness (mild TTP LLQ).     Comments: Well healed surgical incision in LLQ without evidence of surrounding cellulitis    Musculoskeletal:     Cervical back: Normal range of motion and neck supple.  Skin:    General: Skin is warm and dry.  Neurological:     General: No focal deficit present.     Mental Status: She is alert and oriented to person, place, and time. Mental status is at baseline.     Cranial Nerves: No cranial nerve deficit.  Psychiatric:        Mood and Affect: Mood normal.        Behavior: Behavior normal.        Thought Content: Thought content normal.        Judgment: Judgment normal.     Data Reviewed:    Labs on Admission: I have personally reviewed following labs and imaging studies  CBC: Recent Labs  Lab 12/30/23 2013  WBC 16.3*  NEUTROABS 12.5*  HGB 12.1  HCT 36.0  MCV 92.1  PLT 398    Basic Metabolic Panel: Recent Labs  Lab 12/30/23 2013  NA 135  K 2.9*  CL 99  CO2 26  GLUCOSE 136*  BUN <5*  CREATININE 0.53  CALCIUM 9.4   GFR: Estimated Creatinine Clearance: 76.4 mL/min (by C-G formula based on SCr of 0.53 mg/dL). Liver Function Tests: Recent Labs  Lab 12/30/23 2013  AST 24  ALT 31  ALKPHOS 99  BILITOT 0.7  PROT 7.8  ALBUMIN 3.4*   No results for input(s): "LIPASE", "AMYLASE" in the last 168 hours. No results for input(s): "AMMONIA" in the last 168 hours. Coagulation  Profile: No results for input(s): "INR", "PROTIME" in the last 168 hours. Cardiac Enzymes: No results for input(s): "CKTOTAL", "CKMB", "CKMBINDEX", "TROPONINI" in the last 168 hours. BNP (last 3 results) No results for input(s): "PROBNP" in the last 8760 hours. HbA1C: No results for input(s): "HGBA1C" in the last 72 hours. CBG: No results for input(s): "GLUCAP" in the last 168 hours. Lipid Profile: No results for input(s): "CHOL", "HDL", "LDLCALC", "TRIG", "CHOLHDL", "LDLDIRECT" in the last 72 hours. Thyroid  Function Tests: No results for input(s): "TSH", "T4TOTAL", "FREET4", "T3FREE", "THYROIDAB" in the last 72 hours. Anemia Panel: No results for input(s): "VITAMINB12", "FOLATE", "FERRITIN", "TIBC", "IRON", "RETICCTPCT" in the last 72 hours. Urine analysis:    Component Value Date/Time   COLORURINE YELLOW 12/30/2023 2015   APPEARANCEUR CLEAR 12/30/2023 2015   LABSPEC 1.031 (H) 12/30/2023 2015   PHURINE 6.0 12/30/2023 2015   GLUCOSEU NEGATIVE 12/30/2023 2015   GLUCOSEU NEGATIVE 11/19/2023 1525   HGBUR NEGATIVE 12/30/2023 2015   BILIRUBINUR NEGATIVE 12/30/2023 2015   BILIRUBINUR negative 11/20/2023 0802   KETONESUR NEGATIVE 12/30/2023 2015   PROTEINUR NEGATIVE 12/30/2023 2015   UROBILINOGEN 1.0 11/20/2023 0802   UROBILINOGEN 2.0 (A) 11/19/2023 1525   NITRITE NEGATIVE 12/30/2023 2015   LEUKOCYTESUR NEGATIVE 12/30/2023 2015    Radiological Exams on Admission: CT  ABDOMEN PELVIS W CONTRAST Result Date: 12/30/2023 CLINICAL DATA:  Left lower quadrant abdominal pain. History of diverticulitis. EXAM: CT ABDOMEN AND PELVIS WITH CONTRAST TECHNIQUE: Multidetector CT imaging of the abdomen and pelvis was performed using the standard protocol following bolus administration of intravenous contrast. RADIATION DOSE REDUCTION: This exam was performed according to the departmental dose-optimization program which includes automated exposure control, adjustment of the mA and/or kV according to patient size and/or use of iterative reconstruction technique. CONTRAST:  85mL OMNIPAQUE  IOHEXOL  300 MG/ML  SOLN COMPARISON:  Dec 19, 2023. FINDINGS: Lower chest: No acute abnormality. Hepatobiliary: No focal liver abnormality is seen. Status post cholecystectomy. No biliary dilatation. Pancreas: Unremarkable. No pancreatic ductal dilatation or surrounding inflammatory changes. Spleen: Normal in size without focal abnormality. Adrenals/Urinary Tract: Adrenal glands are unremarkable. Kidneys are normal, without renal calculi, focal lesion, or hydronephrosis. Bladder is unremarkable. Stomach/Bowel: The stomach and appendix are unremarkable. There is no evidence of bowel obstruction. Percutaneous drain noted on prior exam has been removed. There remains mild wall thickening of the sigmoid colon with minimal surrounding inflammatory changes suggesting acute diverticulitis. 5.6 x 3.8 cm fluid collection with air-fluid levels is noted inferior to the sigmoid colon most consistent with abscess. This is been described as adnexal cyst on prior exam, but the presence of air-fluid levels make this more likely to be diverticular abscess. Vascular/Lymphatic: No significant vascular findings are present. No enlarged abdominal or pelvic lymph nodes. Reproductive: Intrauterine device is noted. Other: Small amount of free fluid is noted in the pelvis. No definite hernia is noted. Musculoskeletal: No acute or  significant osseous findings. IMPRESSION: Mild to moderate sigmoid diverticulitis is again noted. 5.6 x 3.8 cm fluid collection with air-fluid levels is again noted inferior to the inflamed segment of sigmoid colon most consistent with paradiverticular abscess given the presence of air-fluid levels. These results will be called to the ordering clinician or representative by the Radiologist Assistant, and communication documented in the PACS or zVision Dashboard. Electronically Signed   By: Rosalene Colon M.D.   On: 12/30/2023 16:33       Assessment and Plan: No notes have been filed under this hospital service. Service: Hospitalist 43 year old female  with history of diverticulitis with abscess status post percutaneous drainage with stent placement on 4/7, stent removal on 5/ 2, admitted for recurrent diverticulitis with abscess.  Diverticulitis with abscess - Leukocytosis otherwise nonseptic. -Surgery and IR consults are pending, recommendations appreciated - N.p.o. - IV fluid, pain control -Zosyn  Lovenox N.p.o. LR monitor/replace electrolytes  Advance Care Planning:   Code Status: Full Code    Consults: General Surgery, IR     Severity of Illness: The appropriate patient status for this patient is INPATIENT. Inpatient status is judged to be reasonable and necessary in order to provide the required intensity of service to ensure the patient's safety. The patient's presenting symptoms, physical exam findings, and initial radiographic and laboratory data in the context of their chronic comorbidities is felt to place them at high risk for further clinical deterioration. Furthermore, it is not anticipated that the patient will be medically stable for discharge from the hospital within 2 midnights of admission.   * I certify that at the point of admission it is my clinical judgment that the patient will require inpatient hospital care spanning beyond 2 midnights from the point of admission  due to high intensity of service, high risk for further deterioration and high frequency of surveillance required.*  Author: Charlesetta Connors, DO 12/31/2023 1:07 AM  For on call review [email or web address].

## 2023-12-30 NOTE — ED Triage Notes (Signed)
 Pt was contacted by her surgeon after having a CT today instructing her to "go to the ED so that I can be put on IV antibiotics and have another procedure to have a drain placed."  The CT indicated that she did have an para-diverticular abscess.  Pt has the same procedure done less than a month ago (12/10/23).

## 2023-12-31 ENCOUNTER — Inpatient Hospital Stay (HOSPITAL_COMMUNITY)

## 2023-12-31 ENCOUNTER — Encounter (HOSPITAL_COMMUNITY): Payer: Self-pay | Admitting: Emergency Medicine

## 2023-12-31 DIAGNOSIS — K5792 Diverticulitis of intestine, part unspecified, without perforation or abscess without bleeding: Secondary | ICD-10-CM | POA: Diagnosis present

## 2023-12-31 DIAGNOSIS — K5720 Diverticulitis of large intestine with perforation and abscess without bleeding: Secondary | ICD-10-CM

## 2023-12-31 DIAGNOSIS — K5732 Diverticulitis of large intestine without perforation or abscess without bleeding: Secondary | ICD-10-CM | POA: Diagnosis not present

## 2023-12-31 DIAGNOSIS — K651 Peritoneal abscess: Secondary | ICD-10-CM | POA: Diagnosis present

## 2023-12-31 DIAGNOSIS — I10 Essential (primary) hypertension: Secondary | ICD-10-CM | POA: Diagnosis present

## 2023-12-31 LAB — BASIC METABOLIC PANEL WITH GFR
Anion gap: 11 (ref 5–15)
BUN: 5 mg/dL — ABNORMAL LOW (ref 6–20)
CO2: 24 mmol/L (ref 22–32)
Calcium: 8.4 mg/dL — ABNORMAL LOW (ref 8.9–10.3)
Chloride: 101 mmol/L (ref 98–111)
Creatinine, Ser: 0.54 mg/dL (ref 0.44–1.00)
GFR, Estimated: >60 mL/min (ref >60)
Glucose, Bld: 83 mg/dL (ref 70–99)
Potassium: 3.6 mmol/L (ref 3.5–5.1)
Sodium: 136 mmol/L (ref 135–145)

## 2023-12-31 LAB — CBC
HCT: 32.4 % — ABNORMAL LOW (ref 36.0–46.0)
Hemoglobin: 10.5 g/dL — ABNORMAL LOW (ref 12.0–15.0)
MCH: 30.7 pg (ref 26.0–34.0)
MCHC: 32.4 g/dL (ref 30.0–36.0)
MCV: 94.7 fL (ref 80.0–100.0)
Platelets: 308 10*3/uL (ref 150–400)
RBC: 3.42 MIL/uL — ABNORMAL LOW (ref 3.87–5.11)
RDW: 14 % (ref 11.5–15.5)
WBC: 13 10*3/uL — ABNORMAL HIGH (ref 4.0–10.5)
nRBC: 0 % (ref 0.0–0.2)

## 2023-12-31 LAB — MAGNESIUM: Magnesium: 1.8 mg/dL (ref 1.7–2.4)

## 2023-12-31 MED ORDER — ACETAMINOPHEN 500 MG PO TABS: 1000.0000 mg | ORAL_TABLET | Freq: Four times a day (QID) | ORAL | Status: DC | PRN

## 2023-12-31 MED ORDER — MIDAZOLAM HCL 2 MG/2ML IJ SOLN
INTRAMUSCULAR | Status: AC
  Filled 2023-12-31: qty 4

## 2023-12-31 MED ORDER — LIDOCAINE HCL 1 % IJ SOLN
10.0000 mL | Freq: Once | INTRAMUSCULAR | Status: AC
  Administered 2023-12-31: 10 mL via INTRADERMAL

## 2023-12-31 MED ORDER — OXYCODONE HCL 5 MG PO TABS
5.0000 mg | ORAL_TABLET | ORAL | Status: DC | PRN
  Administered 2023-12-31 (×2): 5 mg via ORAL
  Filled 2023-12-31 (×3): qty 1

## 2023-12-31 MED ORDER — FENTANYL CITRATE (PF) 100 MCG/2ML IJ SOLN
INTRAMUSCULAR | Status: AC
  Filled 2023-12-31: qty 4

## 2023-12-31 MED ORDER — FENTANYL CITRATE (PF) 100 MCG/2ML IJ SOLN
INTRAMUSCULAR | Status: AC | PRN
  Administered 2023-12-31: 25 ug via INTRAVENOUS
  Administered 2023-12-31 (×2): 50 ug via INTRAVENOUS

## 2023-12-31 MED ORDER — ONDANSETRON HCL 4 MG PO TABS: 4.0000 mg | ORAL_TABLET | Freq: Four times a day (QID) | ORAL | Status: DC | PRN

## 2023-12-31 MED ORDER — MIDAZOLAM HCL 2 MG/2ML IJ SOLN
INTRAMUSCULAR | Status: AC | PRN
  Administered 2023-12-31: .5 mg via INTRAVENOUS
  Administered 2023-12-31 (×2): 1 mg via INTRAVENOUS

## 2023-12-31 MED ORDER — CLOTRIMAZOLE 1 % EX CREA
TOPICAL_CREAM | Freq: Two times a day (BID) | CUTANEOUS | Status: DC
  Administered 2023-12-31 – 2024-01-01 (×2): 1 via TOPICAL
  Filled 2023-12-31: qty 15

## 2023-12-31 MED ORDER — ENOXAPARIN SODIUM 40 MG/0.4ML IJ SOSY: 40.0000 mg | PREFILLED_SYRINGE | INTRAMUSCULAR | Status: DC

## 2023-12-31 MED ORDER — ACETAMINOPHEN 650 MG RE SUPP: 650.0000 mg | Freq: Four times a day (QID) | RECTAL | Status: DC | PRN

## 2023-12-31 MED ORDER — SODIUM CHLORIDE 0.9% FLUSH
5.0000 mL | Freq: Three times a day (TID) | INTRAVENOUS | Status: DC
  Administered 2023-12-31 – 2024-01-02 (×5): 5 mL

## 2023-12-31 MED ORDER — ACETAMINOPHEN 325 MG PO TABS
650.0000 mg | ORAL_TABLET | Freq: Four times a day (QID) | ORAL | Status: DC | PRN
  Administered 2023-12-31 – 2024-01-01 (×2): 650 mg via ORAL
  Filled 2023-12-31 (×2): qty 2

## 2023-12-31 MED ORDER — LACTATED RINGERS IV SOLN: INTRAVENOUS | Status: AC

## 2023-12-31 MED ORDER — LIDOCAINE HCL 1 % IJ SOLN
10.0000 mL | Freq: Once | INTRAMUSCULAR | Status: AC
  Administered 2023-12-31: 10 mL via INTRADERMAL
  Filled 2023-12-31: qty 10

## 2023-12-31 MED ORDER — ONDANSETRON HCL 4 MG/2ML IJ SOLN
4.0000 mg | Freq: Four times a day (QID) | INTRAMUSCULAR | Status: DC | PRN
Start: 2023-12-31 — End: 2024-01-02

## 2023-12-31 MED ORDER — PIPERACILLIN-TAZOBACTAM 3.375 G IVPB
3.3750 g | Freq: Three times a day (TID) | INTRAVENOUS | Status: DC
  Administered 2023-12-31 – 2024-01-02 (×7): 3.375 g via INTRAVENOUS
  Filled 2023-12-31 (×7): qty 50

## 2023-12-31 NOTE — Hospital Course (Signed)
 Faith Evans is a 43 y.o. female with medical history significant of diverticulitis complicated by abscess s/p stent placement/removal  4/7-5/2 presenting with worsening left lower quadrant abdominal pain x 2 days with associated decreased stool volume, fever, and chills.  No diarrhea, bloody stools, nausea, or vomiting.   In the emergency department, she was afebrile, hemodynamically stable with a leukocytosis of 16.3.  CT scan of the abdomen revealed diverticulitis with findings consistent with recurrent peridiverticular abscess.  Emergency room physician discussed with on-call general surgery and IR who recommended admission to hospital service for formal consult in the morning.   Assessment and Plan:   Acute diverticulitis with abscess -CT scanning noting a 5.6 x 3.8 cm abscess.  History of diverticulitis with abscess status post IR percutaneous drainage 4/7 with subsequent removal 5/2.  No indications for surgical intervention at this time.  Surgery following closely.  IR consulted with percutaneous drain placed 5/14.  Tolerating advancement diet.  Empiric antibiotics on board.  Leukocytosis improving.  Pain largely resolved.  Possibility of elective colectomy in the outpatient setting

## 2023-12-31 NOTE — Plan of Care (Signed)

## 2023-12-31 NOTE — Procedures (Signed)
 Interventional Radiology Procedure Note  Procedure: Image guided drain placement, LLQ/adnexal abscess.  94F pigtail drain.  Complications: None  EBL: None Sample: Culture sent  Recommendations: - Routine drain care, with sterile flushes, record output - follow up Cx - routine wound care - ok to advance diet per primary order  Signed,  Marciano Settles. Mabel Savage, DO

## 2023-12-31 NOTE — Progress Notes (Signed)
 Pt arrived in the unit via bed AO X4 VS stable

## 2023-12-31 NOTE — Consult Note (Signed)
 Chief Complaint: Patient was seen in consultation today for intra-abdominal abscess  Referring Physician(s): Dr. Marny Sires  Supervising Physician: Myrlene Asper  Patient Status: [facility] - In-pt  History of Present Illness: Faith Evans is a 43 y.o. female with history of diverticulosis known to Interventional Radiology from prior abdominal drain placement 11/24/23.  At the time of drain placement, the collection was ill-defined as either pericolonic vs. Left adnexal.  After initial placement there was adequate decompression of the superior aspect of the collection.  At follow-up with IR 12/10/23 drain evaluation with CT showed incomplete resolution with suspected malposition and the patient underwent exchange and upsize from 10 to 12Fr drain.  At follow-up with IR 5/2, the suspected diverticular abscess had resolved and the remaining apparent left adnexal cyst was unchanged. Drain injection confirmed no residual abscess cavity and the drain was removed.   Patient presented to ED overnight with abdominal pain and CT shows new infectious characteristics of the intra-abdominal fluid collection.  IR consulted for aspiration and drainage. Case reviewed by Dr. Mabel Savage who approves patient for drain placement.     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   Fatty liver    GERD (gastroesophageal reflux disease)    History of hemolysis, elevated liver enzymes, and low platelet (HELLP) syndrome 2007   had pre-eclampsia and Toxemia   Hypertension    Pneumonia     Past Surgical History:  Procedure Laterality Date   CESAREAN SECTION  2007   emergent d/t HELLP, Toxemia   CHOLECYSTECTOMY N/A 01/22/2023   Procedure: LAPAROSCOPIC CHOLECYSTECTOMY WITH ICG DYE;  Surgeon: Adalberto Acton, MD;  Location: [facility];  Service: General;  Laterality: N/A;   IR CATHETER  TUBE CHANGE  12/10/2023   IR RADIOLOGIST EVAL & MGMT  12/10/2023   IR RADIOLOGIST EVAL & MGMT  12/19/2023   NO PAST SURGERIES     WISDOM TOOTH EXTRACTION      Allergies: Amoxicillin and Cinnamon  Medications: Prior to Admission medications   Medication Sig Start Date End Date Taking? Authorizing Provider  acetaminophen  (TYLENOL ) 500 MG tablet Take 1,000 mg by mouth every 6 (six) hours as needed for moderate pain (pain score 4-6).   Yes [provider]  clotrimazole-betamethasone (LOTRISONE) cream Apply 1 Application topically daily. 12/26/23  Yes Zorita Hiss, NP  predniSONE  (DELTASONE ) 20 MG tablet Take 2 tablets (40 mg total) by mouth daily with breakfast. Patient not taking: Reported on 12/30/2023 12/26/23   Zorita Hiss, NP  sodium chloride  flush (NS) 0.9 % SOLN Flush drain every day with 5 mL normal saline. Patient not taking: Reported on 12/30/2023 11/26/23   Pommier, Wyatt H, PA-C  triamcinolone  cream (KENALOG ) 0.1 % Apply 1 application. topically 2 (two) times daily. To affected area till better Patient not taking: Reported on 12/30/2023 11/21/21   Ann Keto, MD     Family History  Problem Relation Age of Onset   Cancer Mother    Seizures Father    Cancer Maternal Grandfather    Colon cancer Neg Hx    Rectal cancer Neg Hx    Stomach cancer Neg Hx    Esophageal cancer Neg Hx     Social History   Socioeconomic History   Marital status: Married    Spouse name: Not on file  Number of children: Not on file   Years of education: Not on file   Highest education level: 12th grade  Occupational History   Not on file  Tobacco Use   Smoking status: Never   Smokeless tobacco: Never  Vaping Use   Vaping status: Never Used  Substance and Sexual Activity   Alcohol use: Yes    Comment: occasionally   Drug use: Never   Sexual activity: Yes  Other Topics Concern   Not on file  Social History Narrative   Not on file   Social Drivers of Health   Financial  Resource Strain: Low Risk  (11/19/2023)   Overall Financial Resource Strain (CARDIA)    Difficulty of Paying Living Expenses: Not hard at all  Food Insecurity: No Food Insecurity (11/23/2023)   Hunger Vital Sign    Worried About Running Out of Food in the Last Year: Never true    Ran Out of Food in the Last Year: Never true  Transportation Needs: No Transportation Needs (11/23/2023)   PRAPARE - Administrator, Civil Service (Medical): No    Lack of Transportation (Non-Medical): No  Physical Activity: Insufficiently Active (11/19/2023)   Exercise Vital Sign    Days of Exercise per Week: 3 days    Minutes of Exercise per Session: 10 min  Stress: No Stress Concern Present (11/19/2023)   [employer] of Occupational Health - Occupational Stress Questionnaire    Feeling of Stress : Not at all  Social Connections: Socially Isolated (11/19/2023)   Social Connection and Isolation Panel [NHANES]    Frequency of Communication with Friends and Family: Once a week    Frequency of Social Gatherings with Friends and Family: Once a week    Attends Religious Services: Never    Database administrator or Organizations: No    Attends Engineer, structural: Not on file    Marital Status: Married     Review of Systems: A 12 point ROS discussed and pertinent positives are indicated in the HPI above.  All other systems are negative.  Review of Systems  Constitutional:  Negative for fatigue and fever.  Respiratory:  Negative for cough and shortness of breath.   Cardiovascular:  Negative for chest pain.  Gastrointestinal:  Positive for abdominal pain. Negative for nausea and vomiting.  Musculoskeletal:  Negative for back pain.  Psychiatric/Behavioral:  Negative for behavioral problems and confusion.     Vital Signs: BP (!) 115/95   Pulse 65   Temp 98.4 F (36.9 C) (Oral)   Resp 18   Ht 4\' 10"  (1.473 m)   Wt 153 lb (69.4 kg)   SpO2 97%   BMI 31.98 kg/m   Physical Exam Vitals  and nursing note reviewed.  Constitutional:      General: She is not in acute distress.    Appearance: Normal appearance. She is not ill-appearing.  HENT:     Mouth/Throat:     Mouth: Mucous membranes are moist.     Pharynx: Oropharynx is clear.  Cardiovascular:     Rate and Rhythm: Normal rate and regular rhythm.  Pulmonary:     Effort: Pulmonary effort is normal. No respiratory distress.     Breath sounds: Normal breath sounds.  Abdominal:     General: Abdomen is flat. There is no distension.     Palpations: Abdomen is soft.     Tenderness: There is abdominal tenderness (lower abdominal).  Skin:    General: Skin is  warm and dry.  Neurological:     General: No focal deficit present.     Mental Status: She is alert and oriented to person, place, and time. Mental status is at baseline.  Psychiatric:        Mood and Affect: Mood normal.        Behavior: Behavior normal.        Thought Content: Thought content normal.        Judgment: Judgment normal.      MD Evaluation Airway: WNL Heart: WNL Abdomen: WNL Chest/ Lungs: WNL ASA  Classification: 3 Mallampati/Airway Score: Two   Imaging: CT ABDOMEN PELVIS W CONTRAST Result Date: 12/30/2023 CLINICAL DATA:  Left lower quadrant abdominal pain. History of diverticulitis. EXAM: CT ABDOMEN AND PELVIS WITH CONTRAST TECHNIQUE: Multidetector CT imaging of the abdomen and pelvis was performed using the standard protocol following bolus administration of intravenous contrast. RADIATION DOSE REDUCTION: This exam was performed according to the departmental dose-optimization program which includes automated exposure control, adjustment of the mA and/or kV according to patient size and/or use of iterative reconstruction technique. CONTRAST:  85mL OMNIPAQUE  IOHEXOL  300 MG/ML  SOLN COMPARISON:  Dec 19, 2023. FINDINGS: Lower chest: No acute abnormality. Hepatobiliary: No focal liver abnormality is seen. Status post cholecystectomy. No biliary  dilatation. Pancreas: Unremarkable. No pancreatic ductal dilatation or surrounding inflammatory changes. Spleen: Normal in size without focal abnormality. Adrenals/Urinary Tract: Adrenal glands are unremarkable. Kidneys are normal, without renal calculi, focal lesion, or hydronephrosis. Bladder is unremarkable. Stomach/Bowel: The stomach and appendix are unremarkable. There is no evidence of bowel obstruction. Percutaneous drain noted on prior exam has been removed. There remains mild wall thickening of the sigmoid colon with minimal surrounding inflammatory changes suggesting acute diverticulitis. 5.6 x 3.8 cm fluid collection with air-fluid levels is noted inferior to the sigmoid colon most consistent with abscess. This is been described as adnexal cyst on prior exam, but the presence of air-fluid levels make this more likely to be diverticular abscess. Vascular/Lymphatic: No significant vascular findings are present. No enlarged abdominal or pelvic lymph nodes. Reproductive: Intrauterine device is noted. Other: Small amount of free fluid is noted in the pelvis. No definite hernia is noted. Musculoskeletal: No acute or significant osseous findings. IMPRESSION: Mild to moderate sigmoid diverticulitis is again noted. 5.6 x 3.8 cm fluid collection with air-fluid levels is again noted inferior to the inflamed segment of sigmoid colon most consistent with paradiverticular abscess given the presence of air-fluid levels. These results will be called to the ordering clinician or representative by the Radiologist Assistant, and communication documented in the PACS or zVision Dashboard. Electronically Signed   By: Rosalene Colon M.D.   On: 12/30/2023 16:33   DG Sinus/Fist Tube Chk-Non GI Result Date: 12/19/2023 CLINICAL DATA:  Patient with a history of diverticular abscess status post abscess drain placed in Interventional Radiology November 24, 2023 EXAM: ABSCESS INJECTION COMPARISON:  None Available. CONTRAST:  5 mL  contrast-administered via the existing percutaneous drain. FLUOROSCOPY TIME:  Radiation exposure index as provided by the fluoroscopic device: 7.2 mGy kerma TECHNIQUE: The patient was positioned supine on the fluoroscopy table. A preprocedural spot fluoroscopic image was obtained of the left lower quadrant drain and the existing percutaneous drainage catheter. Multiple spot fluoroscopic and radiographic images were obtained following the injection of a small amount of contrast via the existing percutaneous drainage catheter. FINDINGS: Contrast injection was negative for a fistula. IMPRESSION: Contrast injection was negative for a fistula. Drain was subsequently removed. Study  performed by Jetta Morrow, NP Electronically Signed   By: Creasie Doctor M.D.   On: 12/19/2023 16:47   IR Radiologist Eval & Mgmt Result Date: 12/19/2023 EXAM: ESTABLISHED PATIENT OFFICE VISIT CHIEF COMPLAINT: See Epic note. HISTORY OF PRESENT ILLNESS: See Epic note. REVIEW OF SYSTEMS: See Epic note. PHYSICAL EXAMINATION: See Epic note. ASSESSMENT AND PLAN: See Epic note. Creasie Doctor, MD [facility] Radiology Electronically Signed   By: Creasie Doctor M.D.   On: 12/19/2023 14:54   CT ABDOMEN PELVIS W CONTRAST Result Date: 12/19/2023 CLINICAL DATA:  43 year old female with history of diverticular abscess status post percutaneous drain placement on 11/24/23 presenting for drain clinic follow-up. EXAM: CT ABDOMEN AND PELVIS WITH CONTRAST TECHNIQUE: Multidetector CT imaging of the abdomen and pelvis was performed using the standard protocol following bolus administration of intravenous contrast. RADIATION DOSE REDUCTION: This exam was performed according to the departmental dose-optimization program which includes automated exposure control, adjustment of the mA and/or kV according to patient size and/or use of iterative reconstruction technique. CONTRAST:  ISOVUE -300 IOPAMIDOL   (ISOVUE -300) INJECTION 61% COMPARISON:  11/23/2023, 11/24/2023, 12/10/2023 FINDINGS: Lower chest: No acute abnormality. Hepatobiliary: No focal liver abnormality is seen. Status post cholecystectomy. No biliary dilatation. Pancreas: Unremarkable. No pancreatic ductal dilatation or surrounding inflammatory changes. Spleen: No splenic injury or perisplenic hematoma. Adrenals/Urinary Tract: Adrenal glands are unremarkable. Kidneys are normal, without renal calculi, focal lesion, or hydronephrosis. Bladder is unremarkable. Stomach/Bowel: Stomach is within normal limits. Appendix appears normal. Similar appearing scattered sigmoid diverticula with minimal, chronic appearing bowel wall thickening in pericolonic fat stranding. Resolution of previously visualized left lower quadrant pericolonic abscess with unchanged position of indwelling pigtail drainage catheter. Vascular/Lymphatic: No significant vascular findings are present. No enlarged abdominal or pelvic lymph nodes. Reproductive: The uterus is present with unchanged position of appropriately positioned intrauterine device. Similar appearing bilateral adnexal cysts, measuring up to 4.6 cm on the left which is adjacent to the resolved fluid collection. Other: No abdominal wall hernia or abnormality. No abdominopelvic ascites. Musculoskeletal: No acute or significant osseous findings. IMPRESSION: 1. Resolution of previously visualized diverticular abscess with unchanged position of indwelling percutaneous drainage catheter. 2. Similar appearance of 4.6 cm simple appearing left adnexal cyst. Recommend follow-up US  in 6-12 months. Note: This recommendation does not apply to premenarchal patients and to those with increased risk (genetic, family history, elevated tumor markers or other high-risk factors) of ovarian cancer. Reference: JACR 2020 Feb; 17(2):248-254 3. Similar appearing sigmoid diverticulosis without evidence of acute inflammatory changes. Creasie Doctor, MD  [facility] Radiology Electronically Signed   By: Creasie Doctor M.D.   On: 12/19/2023 10:30   CT ABDOMEN PELVIS W CONTRAST Result Date: 12/10/2023 CLINICAL DATA:  Diverticular abscess, drain check EXAM: CT ABDOMEN AND PELVIS WITH CONTRAST TECHNIQUE: Multidetector CT imaging of the abdomen and pelvis was performed using the standard protocol following bolus administration of intravenous contrast. RADIATION DOSE REDUCTION: This exam was performed according to the departmental dose-optimization program which includes automated exposure control, adjustment of the mA and/or kV according to patient size and/or use of iterative reconstruction technique. CONTRAST:  100mL ISOVUE -300 IOPAMIDOL  (ISOVUE -300) INJECTION 61% COMPARISON:  CT AP, 11/23/2023 and 03/06/2023.  IR CT, 11/24/2023. FINDINGS: Lower chest: No acute abnormality. Incidental midline positioning of the imaged portions of the heart. Hepatobiliary: No focal liver abnormality is seen. Cholecystectomy. No biliary dilatation. Pancreas: No pancreatic ductal dilatation or surrounding inflammatory changes. Spleen: Normal in size without focal abnormality.  Small perihilar accessory spleen. Adrenals/Urinary Tract: Adrenal glands are unremarkable. 1.1 cm LEFT inferior renal pole renal cortical hypodensity too small to adequately characterize though likely a small cyst. No follow-up is recommended. Kidneys are otherwise normal, without renal calculi or hydronephrosis. Bladder is unremarkable. Stomach/Bowel: Stomach is within normal limits. Small bowel is nonobstructed. Appendix appears normal. Nondilated colon. Severe burden of sigmoid diverticulosis with mild residual stranding above the sigmoid. Vascular/Lymphatic: No significant vascular findings are present. No enlarged abdominal or pelvic lymph nodes. Reproductive: IUD.  Uterus and adnexa are unremarkable. Other: No abdominal wall hernia or abnormality. No  abdominopelvic ascites. Stable positioning of LEFT lower quadrant approach percutaneous drainage catheter. Rim-enhancing LEFT lower quadrant abscess inferior to the sigmoid and lateral to the uterus, measuring approximately 4.5 x 4.5 x 4.5 cm (AP by transaxial by CC). Previously measuring 5.2 x 4.6 x 4.7 (11/23/2023). Musculoskeletal: No acute osseous findings. IMPRESSION: Since CT AP dated 11/23/2023; 1. Percutaneous drainage catheter placement into a LEFT lower quadrant pelvic abscess. 2. Mild interval decrease in size of the collection without resolution. The drain is located at the anterior inferior margin of the collection. 3. Resolving diverticulitis. Art Largo, MD [facility] Inter[facility]iology Special[facility] Radiology Electronically Signed   By: Art Largo M.D.   On: 12/10/2023 16:57   DG Sinus/Fist Tube Chk-Non GI Result Date: 12/10/2023 CLINICAL DATA:  Drain follow up Briefly, 43 year old female with a history of diverticular abscess s/p drain placement 11/24/2023. EXAM: ABSCESS DRAIN INJECTION COMPARISON:  None Available. CONTRAST:  5 mL Omnipaque  300-administered via the existing percutaneous drain. FLUOROSCOPY TIME:  Fluoroscopic dose; 4.4 mGy TECHNIQUE: The patient was positioned supine on the fluoroscopy table. A preprocedural spot fluoroscopic image was obtained of the LEFT lower quadrant and the existing percutaneous drainage catheter. Multiple spot fluoroscopic and radiographic images were obtained following the injection of a small amount of contrast via the existing percutaneous drainage catheter. FINDINGS: *Stable positioning of a lateral-approach LEFT lower quadrant percutaneous drainage catheter. *Contrast injection with incomplete filling of the abscess collection *No fluoroscopic evidence of fistulous communication. IMPRESSION: Incomplete filling of the abscess on drain injection, with a residual collection on comparison CT. There drain was recommended for upsize with  potential repositioning. Electronically Signed   By: Art Largo M.D.   On: 12/10/2023 15:49   IR Catheter Tube Change Result Date: 12/10/2023 CLINICAL DATA:  Catheter tip no longer in fluid collection EXAM: PELVIC ABSCESS DRAINAGE CATHETER EXCHANGE COMPARISON:  IR fluoroscopy and CT AP, earlier same day. IR CT, 11/24/2023 CONTRAST:  5 mL Omnipaque  300-administered via the percutaneous drainage catheter. MEDICATIONS: None. ANESTHESIA/SEDATION: Local anesthetic was administered. FLUOROSCOPY TIME:  Fluoroscopic dose; 49.3 mGy TECHNIQUE: Patient was positioned supine on the fluoroscopy table. The external portion of the existing percutaneous drainage catheter as well as the surrounding skin was prepped and draped in usual sterile fashion. A preprocedural spot fluoroscopic image was obtained of the existing percutaneous drainage catheter. A small amount of contrast was injected via the existing percutaneous drainage catheter and several fluoroscopic images were obtained in various obliquities. The external portion of the percutaneous drainage catheter was cut and cannulated with a short Amplatz wire. Under intermittent fluoroscopic guidance, the existing percutaneous drainage catheter was exchanged for a new, slightly larger 12 Fr percutaneous drainage catheter with end coiled and locked within the abscess cavity. Contrast injection confirmed appropriate position functionality of the percutaneous drainage catheter. The percutaneous drainage catheter was connected to a bulb suction and secured in place within interrupted  suture and a StatLock device. A dressing was applied. The patient tolerated the procedure well without immediate postprocedural complication. FINDINGS: Similar positioning of LEFT lateral-approach pelvic drainage catheter. Upsize with repositioning attempt into the abscess collection. IMPRESSION: Successful fluoroscopic-guided upsize, and attempt at repositioning of, a percutaneous pelvic abscess  drainage catheter. PLAN: The patient will return to Vascular Interventional Radiology (VIR) for routine drainage catheter evaluation in 7-10 days. Art Largo, MD [facility] Radiology Electronically Signed   By: Art Largo M.D.   On: 12/10/2023 14:27   IR Radiologist Eval & Mgmt Result Date: 12/10/2023 EXAM: ESTABLISHED PATIENT VISIT CHIEF COMPLAINT: See below HISTORY OF PRESENT ILLNESS: See below REVIEW OF SYSTEMS: See below PHYSICAL EXAMINATION: See below ASSESSMENT AND PLAN: Please refer to completed note in the electronic medical record on [place] Epic Art Largo, MD [facility] Interventional Radiology [facility] Radiology Electronically Signed   By: Art Largo M.D.   On: 12/10/2023 09:27    Labs:  CBC: Recent Labs    11/25/23 0546 11/26/23 0622 12/30/23 2013 12/31/23 0347  WBC 16.7* 13.0* 16.3* 13.0*  HGB 11.5* 12.9 12.1 10.5*  HCT 34.2* 38.9 36.0 32.4*  PLT 369 360 398 308    COAGS: Recent Labs    11/24/23 0622  INR 1.1    BMP: Recent Labs    11/24/23 0622 11/25/23 0546 12/30/23 2013 12/31/23 0347  NA 137 137 135 136  K 3.2* 3.6 2.9* 3.6  CL 103 103 99 101  CO2 25 25 26 24   GLUCOSE 99 97 136* 83  BUN 5* 5* <5* 5*  CALCIUM 7.8* 7.8* 9.4 8.4*  CREATININE 0.56 0.54 0.53 0.54  GFRNONAA >60 >60 >60 >60    LIVER FUNCTION TESTS: Recent Labs    11/23/23 0751 11/24/23 0622 11/25/23 0546 12/30/23 2013  BILITOT 0.4 0.5 0.5 0.7  AST 22 14* 11* 24  ALT 34 28 20 31   ALKPHOS 94 71 60 99  PROT 7.3 5.9* 5.6* 7.8  ALBUMIN 3.7 2.4* 2.2* 3.4*    TUMOR MARKERS: No results for input(s): "AFPTM", "CEA", "CA199", "CHROMGRNA" in the last 8760 hours.  Assessment and Plan: Intra-abdominal fluid collection Patient with history of complex intra-abdominal fluid collections.  She underwent drain placement 4/7 with initial resolve of the suspected diverticular abscess.  Given her improvement on abx with  stable imaging of the remaining collection, the drain was removed 12/19/23.  Unfortunately she has returned with abdominal pain, now with imaging findings concerning for abscess of the left adnexal vs. Pericolonic fluid collection.  IR consulted for aspiration and drainage.  Dr. Mabel Savage has reviewed and approved patient for drain replacement.  Discussed with patient and her husband.  Reviewed the past month of various interventions and the progression of her imaging.    Risks and benefits discussed with the patient including bleeding, infection, damage to adjacent structures, bowel perforation/fistula connection, and sepsis.  All of the patient's questions were answered, patient is agreeable to proceed. Consent signed and in chart.  Thank you for this interesting consult.  I greatly enjoyed meeting [facility] and look forward to participating in their care.  A copy of this report was sent to the requesting provider on this date.  Electronically Signed: Tasmia Blumer Sue-Ellen Jakaiya Netherland, PA 12/31/2023, 11:29 AM   I spent a total of 40 Minutes    in face to face in clinical consultation, greater than 50% of which was counseling/coordinating care for intra-abdominal fluid collection.

## 2023-12-31 NOTE — Progress Notes (Signed)
  Progress Note   Patient: Faith Evans ZOX:[id number] DOB: 1981-05-28 DOA: 12/30/2023     0 DOS: the patient was seen and examined on 12/31/2023   Brief hospital course:  Faith Evans is a 43 y.o. female with medical history significant of diverticulitis complicated by abscess s/p stent placement/removal  4/7-5/2 presenting with worsening left lower quadrant abdominal pain x 2 days with associated decreased stool volume, fever, and chills.  No diarrhea, bloody stools, nausea, or vomiting.   In the emergency department, she was afebrile, hemodynamically stable with a leukocytosis of 16.3.  CT scan of the abdomen revealed diverticulitis with findings consistent with recurrent peridiverticular abscess.  Emergency room physician discussed with on-call general surgery and IR who recommended admission to hospital service for formal consult in the morning.  Assessment and Plan:  Acute diverticulitis with abscess -CT scanning noting a 5.6 x 3.8 cm abscess.  History of diverticulitis with abscess status post IR percutaneous drainage 4/7 with subsequent removal 5/2.  No indications for surgical intervention at this time.  Surgery following closely.  IR consulted with likely percutaneous drain later this morning.  Currently NPO.  Empiric antibiotics on board.  Leukocytosis improving.  Pain largely resolved.    Subjective: Patient feeling slightly improved this morning.  Denies any fever, chills, shortness of breath, chest pain, nausea, vomiting.  Mild lower quadrant abdominal pain.  Physical Exam: Vitals:   12/31/23 1045 12/31/23 1100 12/31/23 1115 12/31/23 1130  BP:    (!) 139/96  Pulse: 89 71 83 86  Resp:    16  Temp:      TempSrc:      SpO2: 98% 96% 99% 100%  Weight:      Height:        GENERAL:  Alert, pleasant, no acute distress  HEENT:  EOMI CARDIOVASCULAR:  RRR, no murmurs appreciated RESPIRATORY:  Clear to auscultation, no wheezing, rales, or rhonchi GASTROINTESTINAL:  Soft, mild  diffuse tenderness, nondistended EXTREMITIES:  No LE edema bilaterally NEURO:  No new focal deficits appreciated SKIN:  No rashes noted PSYCH:  Appropriate mood and affect     Data Reviewed:  There are no new results to review at this time.  Labs: CBC: Recent Labs  Lab 12/30/23 2013 12/31/23 0347  WBC 16.3* 13.0*  NEUTROABS 12.5*  --   HGB 12.1 10.5*  HCT 36.0 32.4*  MCV 92.1 94.7  PLT 398 308   Basic Metabolic Panel: Recent Labs  Lab 12/30/23 2013 12/31/23 0347  NA 135 136  K 2.9* 3.6  CL 99 101  CO2 26 24  GLUCOSE 136* 83  BUN <5* 5*  CREATININE 0.53 0.54  CALCIUM 9.4 8.4*   Liver Function Tests: Recent Labs  Lab 12/30/23 2013  AST 24  ALT 31  ALKPHOS 99  BILITOT 0.7  PROT 7.8  ALBUMIN 3.4*   CBG: No results for input(s): "GLUCAP" in the last 168 hours.  Scheduled Meds: Continuous Infusions:  lactated ringers  Stopped (12/31/23 8119)   piperacillin-tazobactam (ZOSYN)  IV Stopped (12/31/23 1005)   PRN Meds:.acetaminophen  **OR** acetaminophen , ondansetron  **OR** ondansetron  (ZOFRAN ) IV, oxyCODONE   Family Communication: Husband at bedside  Disposition: Status is: Inpatient Remains inpatient appropriate because: Diverticular abscess     Time spent: 36 minutes  Author: Jodeane Mulligan, DO 12/31/2023 12:37 PM  For on call review [email or web address].

## 2023-12-31 NOTE — Consult Note (Signed)
 Consulting Physician: Avon Boers Kimber Esterly  Referring Provider: Izzabell Klasen Boring PA-C ER Provider  Chief Complaint: Recurrent diverticulitis  Reason for Consult: Recurrent diverticulitis   Subjective   HPI: Faith Evans is an 43 y.o. female who is here for recurrent diverticulitis.  She had diverticulitis with IR drain placement to treat a pericolonic abscess.  The drain need to be upsized and eventually was removed on May 2.  Since then she had a interval feeling well but now is again having left lower quadrant abdominal pain for the last 2 days.  CT was completed as an outpatient and she was recommended present to the emergency room for medical admission and IR drain replacement.  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   Fatty liver    GERD (gastroesophageal reflux disease)    History of hemolysis, elevated liver enzymes, and low platelet (HELLP) syndrome 2007   had pre-eclampsia and Toxemia   Hypertension    Pneumonia     Past Surgical History:  Procedure Laterality Date   CESAREAN SECTION  2007   emergent d/t HELLP, Toxemia   CHOLECYSTECTOMY N/A 01/22/2023   Procedure: LAPAROSCOPIC CHOLECYSTECTOMY WITH ICG DYE;  Surgeon: Adalberto Acton, MD;  Location: [facility];  Service: General;  Laterality: N/A;   IR CATHETER TUBE CHANGE  12/10/2023   IR RADIOLOGIST EVAL & MGMT  12/10/2023   IR RADIOLOGIST EVAL & MGMT  12/19/2023   NO PAST SURGERIES     WISDOM TOOTH EXTRACTION      Family History  Problem Relation Age of Onset   Cancer Mother    Seizures Father    Cancer Maternal Grandfather    Colon cancer Neg Hx    Rectal cancer Neg Hx    Stomach cancer Neg Hx    Esophageal cancer Neg Hx     Social:  reports that she has never smoked. She has never used smokeless tobacco. She reports current alcohol use. She reports that she does not use  drugs.  Allergies:  Allergies  Allergen Reactions   Amoxicillin Rash   Cinnamon Rash    Medications: Current Outpatient Medications  Medication Instructions   acetaminophen  (TYLENOL ) 1,000 mg, Every 6 hours PRN   clotrimazole-betamethasone (LOTRISONE) cream 1 Application, Topical, Daily   predniSONE  (DELTASONE ) 40 mg, Oral, Daily with breakfast   sodium chloride  flush (NS) 0.9 % SOLN Flush drain every day with 5 mL normal saline.   triamcinolone  cream (KENALOG ) 0.1 % 1 application , Topical, 2 times daily, To affected area till better    ROS - all of the below systems have been reviewed with the patient and positives are indicated with bold text General: chills, fever or night sweats Eyes: blurry vision or double vision ENT: epistaxis or sore throat Allergy/Immunology: itchy/watery eyes or nasal congestion Hematologic/Lymphatic: bleeding problems, blood clots or swollen lymph nodes Endocrine: temperature intolerance or unexpected weight changes Breast: new or changing breast lumps or nipple discharge Resp: cough, shortness of breath, or wheezing CV: chest pain or dyspnea on exertion GI: as per HPI GU: dysuria, trouble voiding, or hematuria MSK: joint pain or joint stiffness Neuro: TIA or stroke symptoms Derm: pruritus and skin lesion changes Psych: anxiety and depression  Objective   PE Blood pressure 108/71, pulse 68, temperature 98.2 F (36.8 C), resp. rate 16, height  4\' 10"  (1.473 m), weight 69.4 kg, SpO2 96%. Constitutional: NAD; conversant; no deformities Eyes: Moist conjunctiva; no lid lag; anicteric; PERRL Neck: Trachea midline; no thyromegaly Lungs: Normal respiratory effort; no tactile fremitus CV: RRR; no palpable thrills; no pitting edema GI: Abd soft, tender left lower quadrant; no palpable hepatosplenomegaly MSK: Normal range of motion of extremities; no clubbing/cyanosis Psychiatric: Appropriate affect; alert and oriented x3 Lymphatic: No palpable  cervical or axillary lymphadenopathy  Results for orders placed or performed during the hospital encounter of 12/30/23 (from the past 24 hours)  Comprehensive metabolic panel     Status: Abnormal   Collection Time: 12/30/23  8:13 PM  Result Value Ref Range   Sodium 135 135 - 145 mmol/L   Potassium 2.9 (L) 3.5 - 5.1 mmol/L   Chloride 99 98 - 111 mmol/L   CO2 26 22 - 32 mmol/L   Glucose, Bld 136 (H) 70 - 99 mg/dL   BUN <5 (L) 6 - 20 mg/dL   Creatinine, Ser 1.61 0.44 - 1.00 mg/dL   Calcium 9.4 8.9 - 09.6 mg/dL   Total Protein 7.8 6.5 - 8.1 g/dL   Albumin 3.4 (L) 3.5 - 5.0 g/dL   AST 24 15 - 41 U/L   ALT 31 0 - 44 U/L   Alkaline Phosphatase 99 38 - 126 U/L   Total Bilirubin 0.7 0.0 - 1.2 mg/dL   GFR, Estimated >04 >54 mL/min   Anion gap 10 5 - 15  CBC with Differential     Status: Abnormal   Collection Time: 12/30/23  8:13 PM  Result Value Ref Range   WBC 16.3 (H) 4.0 - 10.5 K/uL   RBC 3.91 3.87 - 5.11 MIL/uL   Hemoglobin 12.1 12.0 - 15.0 g/dL   HCT 09.8 11.9 - 14.7 %   MCV 92.1 80.0 - 100.0 fL   MCH 30.9 26.0 - 34.0 pg   MCHC 33.6 30.0 - 36.0 g/dL   RDW 82.9 56.2 - 13.0 %   Platelets 398 150 - 400 K/uL   nRBC 0.0 0.0 - 0.2 %   Neutrophils Relative % 77 %   Neutro Abs 12.5 (H) 1.7 - 7.7 K/uL   Lymphocytes Relative 17 %   Lymphs Abs 2.8 0.7 - 4.0 K/uL   Monocytes Relative 5 %   Monocytes Absolute 0.8 0.1 - 1.0 K/uL   Eosinophils Relative 0 %   Eosinophils Absolute 0.0 0.0 - 0.5 K/uL   Basophils Relative 0 %   Basophils Absolute 0.1 0.0 - 0.1 K/uL   Immature Granulocytes 1 %   Abs Immature Granulocytes 0.16 (H) 0.00 - 0.07 K/uL  hCG, serum, qualitative     Status: None   Collection Time: 12/30/23  8:13 PM  Result Value Ref Range   Preg, Serum NEGATIVE NEGATIVE  Urinalysis, w/ Reflex to Culture (Infection Suspected) -Urine, Clean Catch     Status: Abnormal   Collection Time: 12/30/23  8:15 PM  Result Value Ref Range   Specimen Source URINE, CLEAN CATCH    Color, Urine  YELLOW YELLOW   APPearance CLEAR CLEAR   Specific Gravity, Urine 1.031 (H) 1.005 - 1.030   pH 6.0 5.0 - 8.0   Glucose, UA NEGATIVE NEGATIVE mg/dL   Hgb urine dipstick NEGATIVE NEGATIVE   Bilirubin Urine NEGATIVE NEGATIVE   Ketones, ur NEGATIVE NEGATIVE mg/dL   Protein, ur NEGATIVE NEGATIVE mg/dL   Nitrite NEGATIVE NEGATIVE   Leukocytes,Ua NEGATIVE NEGATIVE   RBC / HPF 0-5 0 - 5  RBC/hpf   WBC, UA 0-5 0 - 5 WBC/hpf   Bacteria, UA NONE SEEN NONE SEEN   Squamous Epithelial / HPF 0-5 0 - 5 /HPF  I-Stat Lactic Acid, ED     Status: None   Collection Time: 12/30/23  8:27 PM  Result Value Ref Range   Lactic Acid, Venous 1.2 0.5 - 1.9 mmol/L  I-Stat Lactic Acid, ED     Status: None   Collection Time: 12/30/23  9:53 PM  Result Value Ref Range   Lactic Acid, Venous 0.6 0.5 - 1.9 mmol/L  Basic metabolic panel     Status: Abnormal   Collection Time: 12/31/23  3:47 AM  Result Value Ref Range   Sodium 136 135 - 145 mmol/L   Potassium 3.6 3.5 - 5.1 mmol/L   Chloride 101 98 - 111 mmol/L   CO2 24 22 - 32 mmol/L   Glucose, Bld 83 70 - 99 mg/dL   BUN 5 (L) 6 - 20 mg/dL   Creatinine, Ser 8.29 0.44 - 1.00 mg/dL   Calcium 8.4 (L) 8.9 - 10.3 mg/dL   GFR, Estimated >56 >21 mL/min   Anion gap 11 5 - 15  CBC     Status: Abnormal   Collection Time: 12/31/23  3:47 AM  Result Value Ref Range   WBC 13.0 (H) 4.0 - 10.5 K/uL   RBC 3.42 (L) 3.87 - 5.11 MIL/uL   Hemoglobin 10.5 (L) 12.0 - 15.0 g/dL   HCT 30.8 (L) 65.7 - 84.6 %   MCV 94.7 80.0 - 100.0 fL   MCH 30.7 26.0 - 34.0 pg   MCHC 32.4 30.0 - 36.0 g/dL   RDW 96.2 95.2 - 84.1 %   Platelets 308 150 - 400 K/uL   nRBC 0.0 0.0 - 0.2 %     Imaging Orders         IR Radiologist Eval & Mgmt      Assessment and Plan   Faith Evans is an 42 y.o. female with recurrent diverticulitis.  Would recommend medical admission with IV antibiotics and IR consultation for drain replacement.  She had discussion with Dr. Jamse Mcgee as an outpatient last week and  wanted to avoid going through with sigmoid colectomy if possible.  Now that this has become a recurrent issue, she may need to reconsiders this decision.  She also could have the option of keeping the drain in place until the time of elective sigmoid resection in the coming months.  The surgery team will follow her progress.    ICD-10-CM   1. Intra-abdominal abscess (HCC)  K65.1        Junie Olds, MD  [facility] Use [email or web address] to contact on call provider  New Patient Billing: [id number] - Moderate MDM

## 2024-01-01 DIAGNOSIS — K651 Peritoneal abscess: Secondary | ICD-10-CM

## 2024-01-01 DIAGNOSIS — K5732 Diverticulitis of large intestine without perforation or abscess without bleeding: Secondary | ICD-10-CM | POA: Diagnosis not present

## 2024-01-01 LAB — CBC
HCT: 34.4 % — ABNORMAL LOW (ref 36.0–46.0)
Hemoglobin: 11.5 g/dL — ABNORMAL LOW (ref 12.0–15.0)
MCH: 30.6 pg (ref 26.0–34.0)
MCHC: 33.4 g/dL (ref 30.0–36.0)
MCV: 91.5 fL (ref 80.0–100.0)
Platelets: 355 10*3/uL (ref 150–400)
RBC: 3.76 MIL/uL — ABNORMAL LOW (ref 3.87–5.11)
RDW: 13.8 % (ref 11.5–15.5)
WBC: 13.4 10*3/uL — ABNORMAL HIGH (ref 4.0–10.5)
nRBC: 0 % (ref 0.0–0.2)

## 2024-01-01 LAB — BASIC METABOLIC PANEL WITH GFR
Anion gap: 12 (ref 5–15)
BUN: 5 mg/dL — ABNORMAL LOW (ref 6–20)
CO2: 24 mmol/L (ref 22–32)
Calcium: 8.5 mg/dL — ABNORMAL LOW (ref 8.9–10.3)
Chloride: 98 mmol/L (ref 98–111)
Creatinine, Ser: 0.65 mg/dL (ref 0.44–1.00)
GFR, Estimated: >60 mL/min (ref >60)
Glucose, Bld: 96 mg/dL (ref 70–99)
Potassium: 3.2 mmol/L — ABNORMAL LOW (ref 3.5–5.1)
Sodium: 134 mmol/L — ABNORMAL LOW (ref 135–145)

## 2024-01-01 MED ORDER — ACETAMINOPHEN 325 MG PO TABS
650.0000 mg | ORAL_TABLET | Freq: Four times a day (QID) | ORAL | Status: DC | PRN
  Administered 2024-01-01 (×2): 650 mg via ORAL
  Filled 2024-01-01 (×2): qty 2

## 2024-01-01 NOTE — Progress Notes (Signed)
 Referring Provider(s): Dr. Jeanann Midget   Supervising Physician: Erica Hau  Patient Status:  [facility] - In-pt  Chief Complaint:  Intra-abdominal abscess - s/p image guided percutaneous intra-abdominal abscess drain placement 12/31/23 Dr. Mabel Savage  Subjective:  Pt states she is feeling well overall but very tired as she did not get much sleep last night.  She has some pain at the site on movement, otherwise no complaints.  All questions answered.    Allergies: Amoxicillin and Cinnamon  Medications: Prior to Admission medications   Medication Sig Start Date End Date Taking? Authorizing Provider  acetaminophen  (TYLENOL ) 500 MG tablet Take 1,000 mg by mouth every 6 (six) hours as needed for moderate pain (pain score 4-6).   Yes [provider]  clotrimazole-betamethasone (LOTRISONE) cream Apply 1 Application topically daily. 12/26/23  Yes Zorita Hiss, NP  predniSONE  (DELTASONE ) 20 MG tablet Take 2 tablets (40 mg total) by mouth daily with breakfast. Patient not taking: Reported on 12/30/2023 12/26/23   Zorita Hiss, NP  sodium chloride  flush (NS) 0.9 % SOLN Flush drain every day with 5 mL normal saline. Patient not taking: Reported on 12/30/2023 11/26/23   Pommier, Wyatt H, PA-C  triamcinolone  cream (KENALOG ) 0.1 % Apply 1 application. topically 2 (two) times daily. To affected area till better Patient not taking: Reported on 12/30/2023 11/21/21   Ann Keto, MD     Vital Signs: BP 131/85 (BP Location: Right Arm)   Pulse 77   Temp 97.8 F (36.6 C) (Oral)   Resp 16   Ht 4\' 10"  (1.473 m)   Wt 153 lb (69.4 kg)   SpO2 97%   BMI 31.98 kg/m   Physical Exam Constitutional:      Appearance: Normal appearance.  Skin:    Comments: LLQ drain F/A easily, statlock and suture in place Skin clean,dry, without signs of infection   Output 10 mL, suction bulb, red, thin   Neurological:     Mental Status: She is alert and oriented to person, place, and time.  Psychiatric:         Behavior: Behavior normal.      Labs:  CBC: Recent Labs    11/26/23 0622 12/30/23 2013 12/31/23 0347 01/01/24 0703  WBC 13.0* 16.3* 13.0* 13.4*  HGB 12.9 12.1 10.5* 11.5*  HCT 38.9 36.0 32.4* 34.4*  PLT 360 398 308 355    COAGS: Recent Labs    11/24/23 0622  INR 1.1    BMP: Recent Labs    11/25/23 0546 12/30/23 2013 12/31/23 0347 01/01/24 0703  NA 137 135 136 134*  K 3.6 2.9* 3.6 3.2*  CL 103 99 101 98  CO2 25 26 24 24   GLUCOSE 97 136* 83 96  BUN 5* <5* 5* 5*  CALCIUM 7.8* 9.4 8.4* 8.5*  CREATININE 0.54 0.53 0.54 0.65  GFRNONAA >60 >60 >60 >60    LIVER FUNCTION TESTS: Recent Labs    11/23/23 0751 11/24/23 0622 11/25/23 0546 12/30/23 2013  BILITOT 0.4 0.5 0.5 0.7  AST 22 14* 11* 24  ALT 34 28 20 31   ALKPHOS 94 71 60 99  PROT 7.3 5.9* 5.6* 7.8  ALBUMIN 3.7 2.4* 2.2* 3.4*    Assessment and Plan:  Drain Location: LLQ Size: Fr size: 10 Fr Date of placement: 12/31/23  Currently to: Drain collection device: suction bulb 24 hour output:  Output by Drain (mL) 12/30/23 0701 - 12/30/23 1900 12/30/23 1901 - 12/31/23 0700 12/31/23 0701 - 12/31/23 1900 12/31/23 1901 -  01/01/24 0700 01/01/24 0701 - 01/01/24 1309  Closed System Drain 1 Left LLQ Bulb (JP) 10 Fr.   45 10     Interval imaging/drain manipulation:  None  Current examination: Flushes/aspirates easily.  Insertion site unremarkable. Suture and stat lock in place. Dressed appropriately.   Plan: Continue TID flushes with 5 cc NS. Record output Q shift. Dressing changes QD or PRN if soiled.  Call IR APP or on call IR MD if difficulty flushing or sudden change in drain output.  Repeat imaging/possible drain injection once output < 10 mL/QD (excluding flush material). Consideration for drain removal if output is < 10 mL/QD (excluding flush material), pending discussion with the providing surgical service.  Discharge planning: Please contact IR APP or on call IR MD prior to patient d/c to  ensure appropriate follow up plans are in place. Typically patient will follow up with IR clinic 10-14 days post d/c for repeat imaging/possible drain injection. IR scheduler will contact patient with date/time of appointment. Patient will need to flush drain QD with 5 cc NS, record output QD, dressing changes every 2-3 days or earlier if soiled.   IR will continue to follow - please call with questions or concerns.  Electronically Signed: Pasty Bongo, PA-C 01/01/2024, 1:09 PM    I spent a total of 15 Minutes at the the patient's bedside AND on the patient's hospital floor or unit, greater than 50% of which was counseling/coordinating care for image guided percutaneous intra-abdominal abscess drain.

## 2024-01-01 NOTE — Progress Notes (Signed)
 Subjective: CC: Reports no abdominal pain except at where the IR drain is located. Tolerating cld but doesn't like the options. No n/v. Passing flatus. Liquid, non-bloody, bm this am.   Tmax 100.3. Tachycardia improved. No hypotension. WBC stable at 13.4 (13) but improved from admission.   Fhx of colon ca in her mother (50's).  Last colonoscopy 2024 - reviewed.   Objective: Vital signs in last 24 hours: Temp:  [97.8 F (36.6 C)-100.3 F (37.9 C)] 97.8 F (36.6 C) (05/15 0825) Pulse Rate:  [65-104] 77 (05/15 0825) Resp:  [16-18] 16 (05/15 0825) BP: (122-145)/(73-100) 131/85 (05/15 0825) SpO2:  [95 %-100 %] 97 % (05/15 0825) Last BM Date : 12/31/23  Intake/Output from previous day: 05/14 0701 - 05/15 0700 In: 1231 [P.O.:714; I.V.:368.9; IV Piggyback:143.1] Out: 55 [Drains:55] Intake/Output this shift: No intake/output data recorded.  PE: Gen:  Alert, NAD, pleasant Abd: Soft, ND, LLQ ttp more than just around the IR drain, IR drain - SS w/ 55cc/24 hours.   Lab Results:  Recent Labs    12/31/23 0347 01/01/24 0703  WBC 13.0* 13.4*  HGB 10.5* 11.5*  HCT 32.4* 34.4*  PLT 308 355   BMET Recent Labs    12/31/23 0347 01/01/24 0703  NA 136 134*  K 3.6 3.2*  CL 101 98  CO2 24 24  GLUCOSE 83 96  BUN 5* 5*  CREATININE 0.54 0.65  CALCIUM 8.4* 8.5*   PT/INR No results for input(s): "LABPROT", "INR" in the last 72 hours. CMP     Component Value Date/Time   NA 134 (L) 01/01/2024 0703   K 3.2 (L) 01/01/2024 0703   CL 98 01/01/2024 0703   CO2 24 01/01/2024 0703   GLUCOSE 96 01/01/2024 0703   BUN 5 (L) 01/01/2024 0703   CREATININE 0.65 01/01/2024 0703   CALCIUM 8.5 (L) 01/01/2024 0703   PROT 7.8 12/30/2023 2013   ALBUMIN 3.4 (L) 12/30/2023 2013   AST 24 12/30/2023 2013   ALT 31 12/30/2023 2013   ALKPHOS 99 12/30/2023 2013   BILITOT 0.7 12/30/2023 2013   GFRNONAA >60 01/01/2024 0703   GFRAA >60 01/19/2017 0307   Lipase     Component Value Date/Time    LIPASE 12.0 07/26/2022 0939    Studies/Results: CT GUIDED PERITONEAL/RETROPERITONEAL FLUID DRAIN BY PERC CATH Result Date: 12/31/2023 INDICATION: 43 year old female with history of diverticulitis and left lower quadrant abscess, previously drain 11/24/2023 and interval drain removal. The patient presents with recurrence in the left adnexa of abscess and referred for drainage. EXAM: CT-GUIDED LEFT ADNEXAL ABSCESS DRAINAGE TECHNIQUE: Multidetector CT imaging of the pelvis was performed following the standard protocol without IV contrast. RADIATION DOSE REDUCTION: This exam was performed according to the departmental dose-optimization program which includes automated exposure control, adjustment of the mA and/or kV according to patient size and/or use of iterative reconstruction technique. MEDICATIONS: The patient is currently admitted to the hospital and receiving intravenous antibiotics. The antibiotics were administered within an appropriate time frame prior to the initiation of the procedure. ANESTHESIA/SEDATION: Moderate (conscious) sedation was employed during this procedure. A total of Versed  2.5 mg and Fentanyl  125 mcg was administered intravenously by the radiology nurse. Total intra-service moderate Sedation Time: 26 minutes. The patient's level of consciousness and vital signs were monitored continuously by radiology nursing throughout the procedure under my direct supervision. COMPLICATIONS: None PROCEDURE: Informed written consent was obtained from the patient after a thorough discussion of the procedural risks, benefits and alternatives.  All questions were addressed. Maximal Sterile Barrier Technique was utilized including caps, mask, sterile gowns, sterile gloves, sterile drape, hand hygiene and skin antiseptic. A timeout was performed prior to the initiation of the procedure. Patient was positioned supine on the CT gantry table. Scout CT acquired for planning purposes. The patient was then  prepped and draped in the usual sterile fashion. 1% lidocaine  was used for local anesthesia. Using CT guidance, a Yueh needle was advanced into the left adnexal abscess. Once we confirmed needle tip position 035 wire was passed into the collection. Ten [place] and 12 [place] dilation was performed, and then we attempted to pass a 12 [place] drain. The following image demonstrated that the drain had been formed after prolapsing to the lateral aspect of the abscess cavity and a new placement was required. A new 15 cm trocar needle was advanced under CT guidance into the abscess cavity. Once we confirmed the trocar position, modified Seldinger technique was used to place a 10 [place] drain. Aspiration of frankly purulent material was sent for culture. The drain was sutured in position and attached to bulb suction. Final CT was acquired. Patient tolerated the procedure well and remained hemodynamically stable throughout. No complications were encountered and no significant blood loss. IMPRESSION: Status post CT-guided drainage of left adnexal abscess. Signed, Marciano Settles. Rexine Cater, RPVI [facility] Electronically Signed   By: Myrlene Asper D.O.   On: 12/31/2023 15:19   CT ABDOMEN PELVIS W CONTRAST Result Date: 12/30/2023 CLINICAL DATA:  Left lower quadrant abdominal pain. History of diverticulitis. EXAM: CT ABDOMEN AND PELVIS WITH CONTRAST TECHNIQUE: Multidetector CT imaging of the abdomen and pelvis was performed using the standard protocol following bolus administration of intravenous contrast. RADIATION DOSE REDUCTION: This exam was performed according to the departmental dose-optimization program which includes automated exposure control, adjustment of the mA and/or kV according to patient size and/or use of iterative reconstruction technique. CONTRAST:  85mL OMNIPAQUE  IOHEXOL  300 MG/ML  SOLN COMPARISON:  Dec 19, 2023. FINDINGS: Lower chest: No acute  abnormality. Hepatobiliary: No focal liver abnormality is seen. Status post cholecystectomy. No biliary dilatation. Pancreas: Unremarkable. No pancreatic ductal dilatation or surrounding inflammatory changes. Spleen: Normal in size without focal abnormality. Adrenals/Urinary Tract: Adrenal glands are unremarkable. Kidneys are normal, without renal calculi, focal lesion, or hydronephrosis. Bladder is unremarkable. Stomach/Bowel: The stomach and appendix are unremarkable. There is no evidence of bowel obstruction. Percutaneous drain noted on prior exam has been removed. There remains mild wall thickening of the sigmoid colon with minimal surrounding inflammatory changes suggesting acute diverticulitis. 5.6 x 3.8 cm fluid collection with air-fluid levels is noted inferior to the sigmoid colon most consistent with abscess. This is been described as adnexal cyst on prior exam, but the presence of air-fluid levels make this more likely to be diverticular abscess. Vascular/Lymphatic: No significant vascular findings are present. No enlarged abdominal or pelvic lymph nodes. Reproductive: Intrauterine device is noted. Other: Small amount of free fluid is noted in the pelvis. No definite hernia is noted. Musculoskeletal: No acute or significant osseous findings. IMPRESSION: Mild to moderate sigmoid diverticulitis is again noted. 5.6 x 3.8 cm fluid collection with air-fluid levels is again noted inferior to the inflamed segment of sigmoid colon most consistent with paradiverticular abscess given the presence of air-fluid levels. These results will be called to the ordering clinician or representative by the Radiologist Assistant, and communication documented in the PACS or zVision Dashboard. Electronically Signed   By:  Rosalene Colon M.D.   On: 12/30/2023 16:33    Anti-infectives: Anti-infectives (From admission, onward)    Start     Dose/Rate Route Frequency Ordered Stop   12/31/23 0600  piperacillin-tazobactam  (ZOSYN) IVPB 3.375 g        3.375 g 12.5 mL/hr over 240 Minutes Intravenous Every 8 hours 12/31/23 0116     12/30/23 2130  piperacillin-tazobactam (ZOSYN) IVPB 3.375 g        3.375 g 100 mL/hr over 30 Minutes Intravenous  Once 12/30/23 2129 12/30/23 2216        Assessment/Plan Sigmoid Diverticulitis with 5.6 x 3.8 cm abscess - S/p IR drain 5/14. Drain per IR - Cont abx. Cx pending. - Adv to FLD - Hopefully patient will improve with conservative treatment.  If patient fails to improve they may require repeating imaging, drain placement, or surgical intervention resulting in a colectomy/colostomy.  This was discussed with the patient. - If patient improves with conservative therapies would recommend f/u with surgeon to discuss possible elective colectomy   - We will follow with you   FEN - FLD, IVF per primary  VTE - SCDs, okay for chem ppx from a general surgery standpoint ID - Zosyn  I reviewed nursing notes, Consultant (IR) notes, hospitalist notes, last 24 h vitals and pain scores, last 48 h intake and output, last 24 h labs and trends, and last 24 h imaging results.   LOS: 1 day    Delton Filbert, [facility] Surgery 01/01/2024, 8:47 AM Please see Amion for pager number during day hours 7:00am-4:30pm

## 2024-01-01 NOTE — Progress Notes (Signed)
   01/01/24 1514  TOC Brief Assessment  Insurance and Status Reviewed  Patient has primary care physician Yes  Home environment has been reviewed spouse  Prior level of function: independent  Prior/Current Home Services No current home services  Social Drivers of Health Review SDOH reviewed no interventions necessary  Readmission risk has been reviewed Yes  Transition of care needs no transition of care needs at this time      Transition of Care Department [facility]) has reviewed patient and no TOC needs have been identified at this time. We will continue to monitor patient advancement through interdisciplinary progression rounds. If new patient transition needs arise, please place a TOC consult.

## 2024-01-01 NOTE — Progress Notes (Signed)
 Attempted to teach pt regarding care of JP drain. Pt states she recently had one and her and her husband both feel comfortable and knowledgeable of taking care of it.  She states she is knowledgeable on flushing it, emptying it and squeezing bulb before closing it back up to create the suction.

## 2024-01-01 NOTE — Progress Notes (Signed)
 Progress Note   Patient: Faith Evans WJX:[id number] DOB: 06/02/1981 DOA: 12/30/2023     1 DOS: the patient was seen and examined on 01/01/2024   Brief hospital course:  SHARIKA FARAONE is a 43 y.o. female with medical history significant of diverticulitis complicated by abscess s/p stent placement/removal  4/7-5/2 presenting with worsening left lower quadrant abdominal pain x 2 days with associated decreased stool volume, fever, and chills.  No diarrhea, bloody stools, nausea, or vomiting.   In the emergency department, she was afebrile, hemodynamically stable with a leukocytosis of 16.3.  CT scan of the abdomen revealed diverticulitis with findings consistent with recurrent peridiverticular abscess.  Emergency room physician discussed with on-call general surgery and IR who recommended admission to hospital service for formal consult in the morning.   Assessment and Plan:   Acute diverticulitis with abscess -CT scanning noting a 5.6 x 3.8 cm abscess.  History of diverticulitis with abscess status post IR percutaneous drainage 4/7 with subsequent removal 5/2.  No indications for surgical intervention at this time.  Surgery following closely.  IR consulted with percutaneous drain placed 5/14.  Tolerating advancement diet.  Empiric antibiotics on board.  Leukocytosis improving.  Pain largely resolved.  Possibility of elective colectomy in the outpatient setting   Subjective: Patient feeling improved this morning.  Complaining of some left-sided abdominal pain this to be expected.  Mitts to fever 100 Tmax 100.3.  Denies any worsening shortness of breath, chest pain, nausea, vomiting.  Physical Exam: Vitals:   12/31/23 1930 01/01/24 0028 01/01/24 0414 01/01/24 0825  BP: (!) 145/89 130/80 (!) 144/78 131/85  Pulse: (!) 104 87 95 77  Resp:   16 16  Temp: 98.2 F (36.8 C) 99 F (37.2 C) 100.3 F (37.9 C) 97.8 F (36.6 C)  TempSrc: Oral Oral Oral Oral  SpO2: 96% 100% 96% 97%  Weight:       Height:        GENERAL:  Alert, pleasant, no acute distress  HEENT:  EOMI CARDIOVASCULAR:  RRR, no murmurs appreciated RESPIRATORY:  Clear to auscultation, no wheezing, rales, or rhonchi GASTROINTESTINAL: Left JP drain soft, normally tender EXTREMITIES:  No LE edema bilaterally NEURO:  No new focal deficits appreciated SKIN:  No rashes noted PSYCH:  Appropriate mood and affect     Data Reviewed:  New imaging review  Labs: CBC: Recent Labs  Lab 12/30/23 2013 12/31/23 0347 01/01/24 0703  WBC 16.3* 13.0* 13.4*  NEUTROABS 12.5*  --   --   HGB 12.1 10.5* 11.5*  HCT 36.0 32.4* 34.4*  MCV 92.1 94.7 91.5  PLT 398 308 355   Basic Metabolic Panel: Recent Labs  Lab 12/30/23 2013 12/31/23 0347 12/31/23 1928 01/01/24 0703  NA 135 136  --  134*  K 2.9* 3.6  --  3.2*  CL 99 101  --  98  CO2 26 24  --  24  GLUCOSE 136* 83  --  96  BUN <5* 5*  --  5*  CREATININE 0.53 0.54  --  0.65  CALCIUM 9.4 8.4*  --  8.5*  MG  --   --  1.8  --    Liver Function Tests: Recent Labs  Lab 12/30/23 2013  AST 24  ALT 31  ALKPHOS 99  BILITOT 0.7  PROT 7.8  ALBUMIN 3.4*   CBG: No results for input(s): "GLUCAP" in the last 168 hours.  Scheduled Meds:  clotrimazole   Topical BID   sodium chloride  flush  5 mL Intracatheter Q8H   Continuous Infusions:  piperacillin-tazobactam (ZOSYN)  IV 3.375 g (01/01/24 1320)   PRN Meds:.acetaminophen , ondansetron  **OR** ondansetron  (ZOFRAN ) IV, oxyCODONE   Family Communication: None  Disposition: Status is: Inpatient Remains inpatient appropriate because: Diverticulitis with abscess     Time spent: 31 minutes  Author: Jodeane Mulligan, DO 01/01/2024 2:37 PM  For on call review [email or web address].

## 2024-01-01 NOTE — Discharge Instructions (Signed)
 Interventional Radiology Percutaneous Abscess Drain Placement After Care   This sheet gives you information about how to care for yourself after your procedure. Your health care provider may also give you more specific instructions. Your drain was placed by an interventional radiologist with [facility]. If you have questions or concerns, contact [facility] at [phone].   What is a percutaneous drain?   A drain is a small plastic tube (catheter) that goes into the fluid collection in your body through your skin.   How long will I need the drain?   How long the drain needs to stay in is determined by where the drain is, how much comes out of the drain each day and if you are having any other surgical procedures.   Interventional radiology will determine when it is time to remove the drain. It is important to follow up as directed so that the drain can be removed as soon as it is safe to do so.   What can I expect after the procedure?   After the procedure, it is common to have:   A small amount of bruising and discomfort in the area where the drainage tube (catheter) was placed.   Sleepiness and fatigue. This should go away after the medicines you were given have worn off.   Follow these instructions at home:   Insertion site care   Check your insertion site when you change the bandage. Check for:   More redness, swelling, or pain.   More fluid or blood.   Warmth.   Pus or a bad smell.   When caring for your insertion site:   Wash your hands with soap and water for at least 20 seconds before and after you change your bandage (dressing). If soap and water are not available, use hand sanitizer.   You do not need to change your dressing everyday if it is clean and dry. Change your dressing every 3 days or as needed when it is soiled, wet or becoming dislodged. You will need to change your dressing each time you shower.   Leave stitches (sutures), skin  glue, or adhesive strips in place. These skin closures may need to stay in place for 2 weeks or longer. If adhesive strip edges start to loosen and curl up, you may trim the loose edges. Do not remove adhesive strips completely unless your health care provider tells you to do so.   Catheter care   Flush the catheter once per day with 5 mL of 0.9% normal saline unless you are told otherwise by your healthcare provider. This helps to prevent clogs in the catheter.   To disconnect the drain, turn the clear plastic tube to the left. Attach the saline syringe by placing it on the white end of the drain and turning gently to the right. Once attached gently push the plunger to the 5 mL mark. After you are done flushing, disconnect the syringe by turning to the left and reattach your drainage container   If you have a bulb please be sure the bulb is charged after reconnecting it - to do this pinch the bulb between your thumb and first finger and close the stopper located on the top of the bulb.    Check for fluid leaking from around your catheter (instead of fluid draining through your catheter). This may be a sign that the drain is no longer working correctly.   Write down the following information every time you empty your  bag:   The date and time.   The amount of drainage.   Activity   Rest at home for 1-2 days after your procedure.   For the first 48 hours do not lift anything more than 10 lbs (about a gallon of milk). You may perform moderate activities/exercise. Please avoid strenuous activities during this time.   Avoid any activities which may pull on your drain as this can cause your drain to become dislodged.   If you were given a sedative during the procedure, it can affect you for several hours. Do not drive or operate machinery until your health care provider says that it is safe.   General instructions   For mild pain take over-the-counter medications as needed for pain such as  Tylenol or Advil. If you are experiencing severe pain please call our office as this may indicate an issue with your drain.    If you were prescribed an antibiotic medicine, take it as told by your health care provider. Do not stop using the antibiotic even if you start to feel better.   You may shower 24 hours after the drain is placed. To do this cover the insertion site with a water tight material such as saran wrap and seal the edges with tape, you may also purchase waterproof dressings at your local drug store. Shower as usual and then remove the water tight dressing and any gauze/tape underneath it once you have exited the shower and dried off. Allow the area to air dry or pat dry with a clean towel. Once the skin is completely dry place a new gauze dressing. It is important to keep the site dry at all times to prevent infection.   Do not submerge the drain - this means you cannot take baths, swim, use a hot tub, etc. until the drain is removed.    Do not use any products that contain nicotine or tobacco, such as cigarettes, e-cigarettes, and chewing tobacco. If you need help quitting, ask your health care provider.   Keep all follow-up visits as told by your health care provider. This is important.   Contact a health care provider if:   You have less than 10 mL of drainage a day for 2-3 days in a row, or as directed by your health care provider.   You have any of these signs of infection:   More redness, swelling, or pain around your incision area.   More fluid or blood coming from your incision area.   Warmth coming from your incision area.   Pus or a bad smell coming from your incision area.   You have fluid leaking from around your catheter (instead of through your catheter).   You are unable to flush the drain.   You have a fever or chills.   You have pain that does not get better with medicine.   You have not been contacted to schedule a drain follow up appointment  within 10 days of discharge from the hospital.   Please call [facility] at [phone] with any questions or concerns.   Get help right away if:   Your catheter comes out.   You suddenly stop having drainage from your catheter.   You suddenly have blood in the fluid that is draining from your catheter.   You become dizzy or you faint.   You develop a rash.   You have nausea or vomiting.   You have difficulty breathing or you feel short  of breath.   You develop chest pain.   You have problems with your speech or vision.   You have trouble balancing or moving your arms or legs.   Summary   It is common to have a small amount of bruising and discomfort in the area where the drainage tube (catheter) was placed. You may also have minor discomfort with movement while the drain is in place.   Flush the drain once per day with 5 mL of 0.9% normal saline (unless you were told otherwise by your healthcare provider).    Record the amount of drainage from the bag every time you empty it.   Change the dressing every 3 days or earlier if soiled/wet. Keep the skin dry under the dressing.   You may shower with the drain in place. Do not submerge the drain (no baths, swimming, hot tubs, etc.).   Contact [facility] at [phone] if you have more redness, swelling, or pain around your incision area or if you have pain that does not get better with medicine.   This information is not intended to replace advice given to you by your health care provider. Make sure you discuss any questions you have with your health care provider.   Document Revised: 11/08/2021 Document Reviewed: 07/31/2019   Elsevier Patient Education  2023 [employer]         Interventional Radiology Drain Record   Empty your drain at least once per day. You may empty it as often as needed. Use this form to write down the amount of fluid that has collected in the drainage container. Bring  this form with you to your follow-up visits. Please call [facility] at [phone] with any questions or concerns prior to your appointment.   Drain #1 location: ___________________   Date __________ Time __________ Amount __________   Date __________ Time __________ Amount __________   Date __________ Time __________ Amount __________   Date __________ Time __________ Amount __________   Date __________ Time __________ Amount __________   Date __________ Time __________ Amount __________   Date __________ Time __________ Amount __________   Date __________ Time __________ Amount __________   Date __________ Time __________ Amount __________   Date __________ Time __________ Amount __________   Date __________ Time __________ Amount __________   Date __________ Time __________ Amount __________   Date __________ Time __________ Amount __________   Date __________ Time __________ Amount __________

## 2024-01-02 ENCOUNTER — Other Ambulatory Visit: Payer: Self-pay | Admitting: Surgery

## 2024-01-02 DIAGNOSIS — K5792 Diverticulitis of intestine, part unspecified, without perforation or abscess without bleeding: Secondary | ICD-10-CM

## 2024-01-02 DIAGNOSIS — K651 Peritoneal abscess: Secondary | ICD-10-CM | POA: Diagnosis not present

## 2024-01-02 DIAGNOSIS — K5732 Diverticulitis of large intestine without perforation or abscess without bleeding: Secondary | ICD-10-CM | POA: Diagnosis not present

## 2024-01-02 DIAGNOSIS — L0291 Cutaneous abscess, unspecified: Secondary | ICD-10-CM

## 2024-01-02 LAB — BASIC METABOLIC PANEL WITH GFR
Anion gap: 9 (ref 5–15)
BUN: <5 mg/dL — ABNORMAL LOW (ref 6–20)
CO2: 25 mmol/L (ref 22–32)
Calcium: 8.3 mg/dL — ABNORMAL LOW (ref 8.9–10.3)
Chloride: 100 mmol/L (ref 98–111)
Creatinine, Ser: 0.65 mg/dL (ref 0.44–1.00)
GFR, Estimated: >60 mL/min (ref >60)
Glucose, Bld: 97 mg/dL (ref 70–99)
Potassium: 3.4 mmol/L — ABNORMAL LOW (ref 3.5–5.1)
Sodium: 134 mmol/L — ABNORMAL LOW (ref 135–145)

## 2024-01-02 LAB — CBC
HCT: 34.5 % — ABNORMAL LOW (ref 36.0–46.0)
Hemoglobin: 11.7 g/dL — ABNORMAL LOW (ref 12.0–15.0)
MCH: 31.1 pg (ref 26.0–34.0)
MCHC: 33.9 g/dL (ref 30.0–36.0)
MCV: 91.8 fL (ref 80.0–100.0)
Platelets: 366 10*3/uL (ref 150–400)
RBC: 3.76 MIL/uL — ABNORMAL LOW (ref 3.87–5.11)
RDW: 13.6 % (ref 11.5–15.5)
WBC: 11.9 10*3/uL — ABNORMAL HIGH (ref 4.0–10.5)
nRBC: 0 % (ref 0.0–0.2)

## 2024-01-02 MED ORDER — ONDANSETRON HCL 4 MG PO TABS: 4.0000 mg | ORAL_TABLET | Freq: Four times a day (QID) | ORAL | 0 refills | Status: DC | PRN

## 2024-01-02 MED ORDER — OXYCODONE HCL 5 MG PO TABS: 5.0000 mg | ORAL_TABLET | ORAL | 0 refills | Status: DC | PRN

## 2024-01-02 MED ORDER — CEFDINIR 300 MG PO CAPS: 300.0000 mg | ORAL_CAPSULE | Freq: Two times a day (BID) | ORAL | 0 refills | Status: AC

## 2024-01-02 MED ORDER — SODIUM CHLORIDE 0.9% FLUSH: 5.0000 mL | Freq: Three times a day (TID) | INTRAVENOUS | 1 refills | Status: DC

## 2024-01-02 MED ORDER — CIPROFLOXACIN HCL 500 MG PO TABS: 500.0000 mg | ORAL_TABLET | Freq: Two times a day (BID) | ORAL | 0 refills | Status: AC

## 2024-01-02 NOTE — Progress Notes (Addendum)
 Subjective: CC: Reports no abdominal pain except at where the IR drain is located. Pain controlled w/ prn tylenol . Tolerating soft diet and had pot roast for dinner without n/v. Passing flatus. BM yesterday.   Afebrile. No tachycardia or hypotension. WBC 11.9 (13.4).    Objective: Vital signs in last 24 hours: Temp:  [99.3 F (37.4 C)-99.8 F (37.7 C)] 99.3 F (37.4 C) (05/16 0451) Pulse Rate:  [87-95] 95 (05/16 0451) Resp:  [16] 16 (05/15 1914) BP: (149-150)/(96-99) 149/99 (05/16 0451) SpO2:  [97 %-99 %] 97 % (05/16 0451) Last BM Date : 12/31/23  Intake/Output from previous day: 05/15 0701 - 05/16 0700 In: 1133.6 [P.O.:960; IV Piggyback:163.6] Out: 10 [Drains:10] Intake/Output this shift: No intake/output data recorded.  PE: Gen:  Alert, NAD, pleasant Abd: Soft, ND, LLQ ttp and around her LLQ IR drain that is improved from yesterday, IR drain - SS w/ 10cc/24 hours.   Lab Results:  Recent Labs    01/01/24 0703 01/02/24 0414  WBC 13.4* 11.9*  HGB 11.5* 11.7*  HCT 34.4* 34.5*  PLT 355 366   BMET Recent Labs    01/01/24 0703 01/02/24 0414  NA 134* 134*  K 3.2* 3.4*  CL 98 100  CO2 24 25  GLUCOSE 96 97  BUN 5* <5*  CREATININE 0.65 0.65  CALCIUM 8.5* 8.3*   PT/INR No results for input(s): "LABPROT", "INR" in the last 72 hours. CMP     Component Value Date/Time   NA 134 (L) 01/02/2024 0414   K 3.4 (L) 01/02/2024 0414   CL 100 01/02/2024 0414   CO2 25 01/02/2024 0414   GLUCOSE 97 01/02/2024 0414   BUN <5 (L) 01/02/2024 0414   CREATININE 0.65 01/02/2024 0414   CALCIUM 8.3 (L) 01/02/2024 0414   PROT 7.8 12/30/2023 2013   ALBUMIN 3.4 (L) 12/30/2023 2013   AST 24 12/30/2023 2013   ALT 31 12/30/2023 2013   ALKPHOS 99 12/30/2023 2013   BILITOT 0.7 12/30/2023 2013   GFRNONAA >60 01/02/2024 0414   GFRAA >60 01/19/2017 0307   Lipase     Component Value Date/Time   LIPASE 12.0 07/26/2022 0939    Studies/Results: CT GUIDED  PERITONEAL/RETROPERITONEAL FLUID DRAIN BY PERC CATH Result Date: 12/31/2023 INDICATION: 43 year old female with history of diverticulitis and left lower quadrant abscess, previously drain 11/24/2023 and interval drain removal. The patient presents with recurrence in the left adnexa of abscess and referred for drainage. EXAM: CT-GUIDED LEFT ADNEXAL ABSCESS DRAINAGE TECHNIQUE: Multidetector CT imaging of the pelvis was performed following the standard protocol without IV contrast. RADIATION DOSE REDUCTION: This exam was performed according to the departmental dose-optimization program which includes automated exposure control, adjustment of the mA and/or kV according to patient size and/or use of iterative reconstruction technique. MEDICATIONS: The patient is currently admitted to the hospital and receiving intravenous antibiotics. The antibiotics were administered within an appropriate time frame prior to the initiation of the procedure. ANESTHESIA/SEDATION: Moderate (conscious) sedation was employed during this procedure. A total of Versed  2.5 mg and Fentanyl  125 mcg was administered intravenously by the radiology nurse. Total intra-service moderate Sedation Time: 26 minutes. The patient's level of consciousness and vital signs were monitored continuously by radiology nursing throughout the procedure under my direct supervision. COMPLICATIONS: None PROCEDURE: Informed written consent was obtained from the patient after a thorough discussion of the procedural risks, benefits and alternatives. All questions were addressed. Maximal Sterile Barrier Technique was utilized including caps, mask, sterile gowns,  sterile gloves, sterile drape, hand hygiene and skin antiseptic. A timeout was performed prior to the initiation of the procedure. Patient was positioned supine on the CT gantry table. Scout CT acquired for planning purposes. The patient was then prepped and draped in the usual sterile fashion. 1% lidocaine  was used  for local anesthesia. Using CT guidance, a Yueh needle was advanced into the left adnexal abscess. Once we confirmed needle tip position 035 wire was passed into the collection. Ten [place] and 12 [place] dilation was performed, and then we attempted to pass a 12 [place] drain. The following image demonstrated that the drain had been formed after prolapsing to the lateral aspect of the abscess cavity and a new placement was required. A new 15 cm trocar needle was advanced under CT guidance into the abscess cavity. Once we confirmed the trocar position, modified Seldinger technique was used to place a 10 [place] drain. Aspiration of frankly purulent material was sent for culture. The drain was sutured in position and attached to bulb suction. Final CT was acquired. Patient tolerated the procedure well and remained hemodynamically stable throughout. No complications were encountered and no significant blood loss. IMPRESSION: Status post CT-guided drainage of left adnexal abscess. Signed, Marciano Settles. Rexine Cater, RPVI [facility] Electronically Signed   By: Myrlene Asper D.O.   On: 12/31/2023 15:19    Anti-infectives: Anti-infectives (From admission, onward)    Start     Dose/Rate Route Frequency Ordered Stop   12/31/23 0600  piperacillin-tazobactam (ZOSYN) IVPB 3.375 g        3.375 g 12.5 mL/hr over 240 Minutes Intravenous Every 8 hours 12/31/23 0116     12/30/23 2130  piperacillin-tazobactam (ZOSYN) IVPB 3.375 g        3.375 g 100 mL/hr over 30 Minutes Intravenous  Once 12/30/23 2129 12/30/23 2216        Assessment/Plan Sigmoid Diverticulitis with 5.6 x 3.8 cm abscess - S/p IR drain 5/14. Drain per IR - Cont abx. Cx with Citrobacter Braakii.  - Tolerating soft diet, exam improved, wbc down. I think we can transition the patient to oral abx and likely d/c. Would recommend f/u with Dr. Marny Sires to discuss possible elective colectomy  given recurrence. Will also need f/u with IR.  - We will follow with you   FEN - Soft, IVF per primary  VTE - SCDs, okay for chem ppx from a general surgery standpoint ID - [email or web address]. Will check with pharmacy on best oral option - sensitivities pending.   I reviewed nursing notes, Consultant (IR) notes, hospitalist notes, last 24 h vitals and pain scores, last 48 h intake and output, last 24 h labs and trends, and last 24 h imaging results.   LOS: 2 days    Delton Filbert, [facility] 01/02/2024, 8:35 AM Please see Amion for pager number during day hours 7:00am-4:30pm

## 2024-01-02 NOTE — Discharge Summary (Signed)
 Physician Discharge Summary   Patient: Faith Evans MRN: [id number] DOB: [birth year]  Admit date:     12/30/2023  Discharge date: 01/02/24  Discharge Physician: Jodeane Mulligan   PCP: Zorita Hiss, NP   Recommendations at discharge:    Pt to be discharged home.   If you experience worsening fever, chills, chest pain, shortness of breath, or other concerning symptoms, please call your PCP or go to the emergency department immediately.  Discharge Diagnoses: Principal Problem:   Sigmoid diverticulitis Active Problems:   Diverticulitis  Resolved Problems:   * No resolved hospital problems. *   Hospital Course:  Faith Evans is a 43 y.o. female with medical history significant of diverticulitis complicated by abscess s/p stent placement/removal  4/7-5/2 presenting with worsening left lower quadrant abdominal pain x 2 days with associated decreased stool volume, fever, and chills.  No diarrhea, bloody stools, nausea, or vomiting.   In the emergency department, she was afebrile, hemodynamically stable with a leukocytosis of 16.3.  CT scan of the abdomen revealed diverticulitis with findings consistent with recurrent peridiverticular abscess.  Emergency room physician discussed with on-call general surgery and IR who recommended admission to hospital service for formal consult in the morning.   Assessment and Plan:   Acute diverticulitis with abscess -CT scanning noting a 5.6 x 3.8 cm abscess.  History of diverticulitis with abscess status post IR percutaneous drainage 4/7 with subsequent removal 5/2.  No indications for surgical intervention at this time.  Surgery following closely.  IR consulted with percutaneous drain placed 5/14.  Tolerating advancement diet.  Empiric antibiotics on board.  Leukocytosis improving.  Pain largely resolved.  IR recommendations as below:  Continue TID flushes with 5 cc NS. Record output Q shift. Dressing changes QD or PRN if soiled.  Call IR APP or on  call IR MD if difficulty flushing or sudden change in drain output.  Repeat imaging/possible drain injection once output < 10 mL/QD (excluding flush material). Consideration for drain removal if output is < 10 mL/QD (excluding flush material), pending discussion with the providing surgical service.  Per General Surgery: Would recommend f/u with Dr. Marny Sires to discuss possible elective colectomy given recurrence.    Consultants: General Surgery, interventional radiology Procedures performed: IR drain placement 5/14 Disposition: Home Diet recommendation:  Discharge Diet Orders (From admission, onward)     Start     Ordered   01/02/24 0000  Diet - low sodium heart healthy        01/02/24 0957           Regular diet  DISCHARGE MEDICATION: Allergies as of 01/02/2024       Reactions   Amoxicillin Rash   Cinnamon Rash        Medication List     STOP taking these medications    Normal Saline Flush 0.9 % Soln   predniSONE  20 MG tablet Commonly known as: DELTASONE    triamcinolone  cream 0.1 % Commonly known as: KENALOG        TAKE these medications    acetaminophen  500 MG tablet Commonly known as: TYLENOL  Take 1,000 mg by mouth every 6 (six) hours as needed for moderate pain (pain score 4-6).   cefdinir 300 MG capsule Commonly known as: OMNICEF Take 1 capsule (300 mg total) by mouth 2 (two) times daily for 7 days.   ciprofloxacin  500 MG tablet Commonly known as: Cipro  Take 1 tablet (500 mg total) by mouth 2 (two) times daily for 10 days.  clotrimazole-betamethasone cream Commonly known as: LOTRISONE Apply 1 Application topically daily.   ondansetron  4 MG tablet Commonly known as: ZOFRAN  Take 1 tablet (4 mg total) by mouth every 6 (six) hours as needed for nausea.   oxyCODONE  5 MG immediate release tablet Commonly known as: Oxy IR/ROXICODONE  Take 1 tablet (5 mg total) by mouth every 4 (four) hours as needed for severe pain (pain score 7-10).                Discharge Care Instructions  (From admission, onward)           Start     Ordered   01/02/24 0000  Discharge wound care:       Comments: Continue TID flushes with 5 cc NS. Record output Q shift. Dressing changes QD or PRN if soiled.  Call IR APP or on call IR MD if difficulty flushing or sudden change in drain output.   01/02/24 0957            Follow-up Information     Myrlene Asper, DO Follow up.   Specialties: Interventional Radiology, Radiology Contact information: [address] [place] [address] [place] [address] [phone]                 Discharge Exam: Faith Evans   12/30/23 1949  Weight: 69.4 kg    GENERAL:  Alert, pleasant, no acute distress  HEENT:  EOMI CARDIOVASCULAR:  RRR, no murmurs appreciated RESPIRATORY:  Clear to auscultation, no wheezing, rales, or rhonchi GASTROINTESTINAL: Left JP drain soft, normally tender EXTREMITIES:  No LE edema bilaterally NEURO:  No new focal deficits appreciated SKIN:  No rashes noted PSYCH:  Appropriate mood and affect    Condition at discharge: improving  The results of significant diagnostics from this hospitalization (including imaging, microbiology, ancillary and laboratory) are listed below for reference.   Imaging Studies: CT GUIDED PERITONEAL/RETROPERITONEAL FLUID DRAIN BY PERC CATH Result Date: 12/31/2023 INDICATION: 43 year old female with history of diverticulitis and left lower quadrant abscess, previously drain 11/24/2023 and interval drain removal. The patient presents with recurrence in the left adnexa of abscess and referred for drainage. EXAM: CT-GUIDED LEFT ADNEXAL ABSCESS DRAINAGE TECHNIQUE: Multidetector CT imaging of the pelvis was performed following the standard protocol without IV contrast. RADIATION DOSE REDUCTION: This exam was performed according to the departmental dose-optimization program which includes automated exposure control, adjustment of the mA and/or kV  according to patient size and/or use of iterative reconstruction technique. MEDICATIONS: The patient is currently admitted to the hospital and receiving intravenous antibiotics. The antibiotics were administered within an appropriate time frame prior to the initiation of the procedure. ANESTHESIA/SEDATION: Moderate (conscious) sedation was employed during this procedure. A total of Versed  2.5 mg and Fentanyl  125 mcg was administered intravenously by the radiology nurse. Total intra-service moderate Sedation Time: 26 minutes. The patient's level of consciousness and vital signs were monitored continuously by radiology nursing throughout the procedure under my direct supervision. COMPLICATIONS: None PROCEDURE: Informed written consent was obtained from the patient after a thorough discussion of the procedural risks, benefits and alternatives. All questions were addressed. Maximal Sterile Barrier Technique was utilized including caps, mask, sterile gowns, sterile gloves, sterile drape, hand hygiene and skin antiseptic. A timeout was performed prior to the initiation of the procedure. Patient was positioned supine on the CT gantry table. Scout CT acquired for planning purposes. The patient was then prepped and draped in the usual sterile fashion. 1% lidocaine  was used for local anesthesia. Using  CT guidance, a Yueh needle was advanced into the left adnexal abscess. Once we confirmed needle tip position 035 wire was passed into the collection. Ten [place] and 12 [place] dilation was performed, and then we attempted to pass a 12 [place] drain. The following image demonstrated that the drain had been formed after prolapsing to the lateral aspect of the abscess cavity and a new placement was required. A new 15 cm trocar needle was advanced under CT guidance into the abscess cavity. Once we confirmed the trocar position, modified Seldinger technique was used to place a 10 [place] drain. Aspiration of frankly purulent material  was sent for culture. The drain was sutured in position and attached to bulb suction. Final CT was acquired. Patient tolerated the procedure well and remained hemodynamically stable throughout. No complications were encountered and no significant blood loss. IMPRESSION: Status post CT-guided drainage of left adnexal abscess. Signed, Marciano Settles. Rexine Cater, RPVI [facility] Radiology Electronically Signed   By: Myrlene Asper D.O.   On: 12/31/2023 15:19   CT ABDOMEN PELVIS W CONTRAST Result Date: 12/30/2023 CLINICAL DATA:  Left lower quadrant abdominal pain. History of diverticulitis. EXAM: CT ABDOMEN AND PELVIS WITH CONTRAST TECHNIQUE: Multidetector CT imaging of the abdomen and pelvis was performed using the standard protocol following bolus administration of intravenous contrast. RADIATION DOSE REDUCTION: This exam was performed according to the departmental dose-optimization program which includes automated exposure control, adjustment of the mA and/or kV according to patient size and/or use of iterative reconstruction technique. CONTRAST:  85mL OMNIPAQUE  IOHEXOL  300 MG/ML  SOLN COMPARISON:  Dec 19, 2023. FINDINGS: Lower chest: No acute abnormality. Hepatobiliary: No focal liver abnormality is seen. Status post cholecystectomy. No biliary dilatation. Pancreas: Unremarkable. No pancreatic ductal dilatation or surrounding inflammatory changes. Spleen: Normal in size without focal abnormality. Adrenals/Urinary Tract: Adrenal glands are unremarkable. Kidneys are normal, without renal calculi, focal lesion, or hydronephrosis. Bladder is unremarkable. Stomach/Bowel: The stomach and appendix are unremarkable. There is no evidence of bowel obstruction. Percutaneous drain noted on prior exam has been removed. There remains mild wall thickening of the sigmoid colon with minimal surrounding inflammatory changes suggesting acute diverticulitis. 5.6 x 3.8 cm fluid  collection with air-fluid levels is noted inferior to the sigmoid colon most consistent with abscess. This is been described as adnexal cyst on prior exam, but the presence of air-fluid levels make this more likely to be diverticular abscess. Vascular/Lymphatic: No significant vascular findings are present. No enlarged abdominal or pelvic lymph nodes. Reproductive: Intrauterine device is noted. Other: Small amount of free fluid is noted in the pelvis. No definite hernia is noted. Musculoskeletal: No acute or significant osseous findings. IMPRESSION: Mild to moderate sigmoid diverticulitis is again noted. 5.6 x 3.8 cm fluid collection with air-fluid levels is again noted inferior to the inflamed segment of sigmoid colon most consistent with paradiverticular abscess given the presence of air-fluid levels. These results will be called to the ordering clinician or representative by the Radiologist Assistant, and communication documented in the PACS or zVision Dashboard. Electronically Signed   By: Rosalene Colon M.D.   On: 12/30/2023 16:33   DG Sinus/Fist Tube Chk-Non GI Result Date: 12/19/2023 CLINICAL DATA:  Patient with a history of diverticular abscess status post abscess drain placed in Interventional Radiology November 24, 2023 EXAM: ABSCESS INJECTION COMPARISON:  None Available. CONTRAST:  5 mL contrast-administered via the existing percutaneous drain. FLUOROSCOPY TIME:  Radiation exposure index as provided by the fluoroscopic device:  7.2 mGy kerma TECHNIQUE: The patient was positioned supine on the fluoroscopy table. A preprocedural spot fluoroscopic image was obtained of the left lower quadrant drain and the existing percutaneous drainage catheter. Multiple spot fluoroscopic and radiographic images were obtained following the injection of a small amount of contrast via the existing percutaneous drainage catheter. FINDINGS: Contrast injection was negative for a fistula. IMPRESSION: Contrast injection was negative  for a fistula. Drain was subsequently removed. Study performed by Jetta Morrow, NP Electronically Signed   By: Creasie Doctor M.D.   On: 12/19/2023 16:47   IR Radiologist Eval & Mgmt Result Date: 12/19/2023 EXAM: ESTABLISHED PATIENT OFFICE VISIT CHIEF COMPLAINT: See Epic note. HISTORY OF PRESENT ILLNESS: See Epic note. REVIEW OF SYSTEMS: See Epic note. PHYSICAL EXAMINATION: See Epic note. ASSESSMENT AND PLAN: See Epic note. Creasie Doctor, MD [facility] Inter[facility] Radiology Special[facility] Radiology Electronically Signed   By: Creasie Doctor M.D.   On: 12/19/2023 14:54   CT ABDOMEN PELVIS W CONTRAST Result Date: 12/19/2023 CLINICAL DATA:  43 year old female with history of diverticular abscess status post percutaneous drain placement on 11/24/23 presenting for drain clinic follow-up. EXAM: CT ABDOMEN AND PELVIS WITH CONTRAST TECHNIQUE: Multidetector CT imaging of the abdomen and pelvis was performed using the standard protocol following bolus administration of intravenous contrast. RADIATION DOSE REDUCTION: This exam was performed according to the departmental dose-optimization program which includes automated exposure control, adjustment of the mA and/or kV according to patient size and/or use of iterative reconstruction technique. CONTRAST:  ISOVUE -300 IOPAMIDOL  (ISOVUE -300) INJECTION 61% COMPARISON:  11/23/2023, 11/24/2023, 12/10/2023 FINDINGS: Lower chest: No acute abnormality. Hepatobiliary: No focal liver abnormality is seen. Status post cholecystectomy. No biliary dilatation. Pancreas: Unremarkable. No pancreatic ductal dilatation or surrounding inflammatory changes. Spleen: No splenic injury or perisplenic hematoma. Adrenals/Urinary Tract: Adrenal glands are unremarkable. Kidneys are normal, without renal calculi, focal lesion, or hydronephrosis. Bladder is unremarkable. Stomach/Bowel: Stomach is within normal limits. Appendix appears normal. Similar appearing scattered sigmoid  diverticula with minimal, chronic appearing bowel wall thickening in pericolonic fat stranding. Resolution of previously visualized left lower quadrant pericolonic abscess with unchanged position of indwelling pigtail drainage catheter. Vascular/Lymphatic: No significant vascular findings are present. No enlarged abdominal or pelvic lymph nodes. Reproductive: The uterus is present with unchanged position of appropriately positioned intrauterine device. Similar appearing bilateral adnexal cysts, measuring up to 4.6 cm on the left which is adjacent to the resolved fluid collection. Other: No abdominal wall hernia or abnormality. No abdominopelvic ascites. Musculoskeletal: No acute or significant osseous findings. IMPRESSION: 1. Resolution of previously visualized diverticular abscess with unchanged position of indwelling percutaneous drainage catheter. 2. Similar appearance of 4.6 cm simple appearing left adnexal cyst. Recommend follow-up US  in 6-12 months. Note: This recommendation does not apply to premenarchal patients and to those with increased risk (genetic, family history, elevated tumor markers or other high-risk factors) of ovarian cancer. Reference: JACR 2020 Feb; 17(2):248-254 3. Similar appearing sigmoid diverticulosis without evidence of acute inflammatory changes. Creasie Doctor, MD [facility] Interventional Radiology Special[facility] Radiology Electronically Signed   By: Creasie Doctor M.D.   On: 12/19/2023 10:30   CT ABDOMEN PELVIS W CONTRAST Result Date: 12/10/2023 CLINICAL DATA:  Diverticular abscess, drain check EXAM: CT ABDOMEN AND PELVIS WITH CONTRAST TECHNIQUE: Multidetector CT imaging of the abdomen and pelvis was performed using the standard protocol following bolus administration of intravenous contrast. RADIATION DOSE REDUCTION: This exam was performed according to the departmental dose-optimization program which includes automated exposure control, adjustment of  the mA and/or kV  according to patient size and/or use of iterative reconstruction technique. CONTRAST:  100mL ISOVUE -300 IOPAMIDOL  (ISOVUE -300) INJECTION 61% COMPARISON:  CT AP, 11/23/2023 and 03/06/2023.  IR CT, 11/24/2023. FINDINGS: Lower chest: No acute abnormality. Incidental midline positioning of the imaged portions of the heart. Hepatobiliary: No focal liver abnormality is seen. Cholecystectomy. No biliary dilatation. Pancreas: No pancreatic ductal dilatation or surrounding inflammatory changes. Spleen: Normal in size without focal abnormality. Small perihilar accessory spleen. Adrenals/Urinary Tract: Adrenal glands are unremarkable. 1.1 cm LEFT inferior renal pole renal cortical hypodensity too small to adequately characterize though likely a small cyst. No follow-up is recommended. Kidneys are otherwise normal, without renal calculi or hydronephrosis. Bladder is unremarkable. Stomach/Bowel: Stomach is within normal limits. Small bowel is nonobstructed. Appendix appears normal. Nondilated colon. Severe burden of sigmoid diverticulosis with mild residual stranding above the sigmoid. Vascular/Lymphatic: No significant vascular findings are present. No enlarged abdominal or pelvic lymph nodes. Reproductive: IUD.  Uterus and adnexa are unremarkable. Other: No abdominal wall hernia or abnormality. No abdominopelvic ascites. Stable positioning of LEFT lower quadrant approach percutaneous drainage catheter. Rim-enhancing LEFT lower quadrant abscess inferior to the sigmoid and lateral to the uterus, measuring approximately 4.5 x 4.5 x 4.5 cm (AP by transaxial by CC). Previously measuring 5.2 x 4.6 x 4.7 (11/23/2023). Musculoskeletal: No acute osseous findings. IMPRESSION: Since CT AP dated 11/23/2023; 1. Percutaneous drainage catheter placement into a LEFT lower quadrant pelvic abscess. 2. Mild interval decrease in size of the collection without resolution. The drain is located at the anterior inferior margin of the collection. 3.  Resolving diverticulitis. Art Largo, MD [facility] and Interventional Radiology Specialists [facility] Radiology Electronically Signed   By: Art Largo M.D.   On: 12/10/2023 16:57   DG Sinus/Fist Tube Chk-Non GI Result Date: 12/10/2023 CLINICAL DATA:  Drain follow up Briefly, 43 year old female with a history of diverticular abscess s/p drain placement 11/24/2023. EXAM: ABSCESS DRAIN INJECTION COMPARISON:  None Available. CONTRAST:  5 mL Omnipaque  300-administered via the existing percutaneous drain. FLUOROSCOPY TIME:  Fluoroscopic dose; 4.4 mGy TECHNIQUE: The patient was positioned supine on the fluoroscopy table. A preprocedural spot fluoroscopic image was obtained of the LEFT lower quadrant and the existing percutaneous drainage catheter. Multiple spot fluoroscopic and radiographic images were obtained following the injection of a small amount of contrast via the existing percutaneous drainage catheter. FINDINGS: *Stable positioning of a lateral-approach LEFT lower quadrant percutaneous drainage catheter. *Contrast injection with incomplete filling of the abscess collection *No fluoroscopic evidence of fistulous communication. IMPRESSION: Incomplete filling of the abscess on drain injection, with a residual collection on comparison CT. There drain was recommended for upsize with potential repositioning. Electronically Signed   By: Art Largo M.D.   On: 12/10/2023 15:49   IR Catheter Tube Change Result Date: 12/10/2023 CLINICAL DATA:  Catheter tip no longer in fluid collection EXAM: PELVIC ABSCESS DRAINAGE CATHETER EXCHANGE COMPARISON:  IR fluoroscopy and CT AP, earlier same day. IR CT, 11/24/2023 CONTRAST:  5 mL Omnipaque  300-administered via the percutaneous drainage catheter. MEDICATIONS: None. ANESTHESIA/SEDATION: Local anesthetic was administered. FLUOROSCOPY TIME:  Fluoroscopic dose; 49.3 mGy TECHNIQUE: Patient was positioned supine on the fluoroscopy table. The external portion of the existing  percutaneous drainage catheter as well as the surrounding skin was prepped and draped in usual sterile fashion. A preprocedural spot fluoroscopic image was obtained of the existing percutaneous drainage catheter. A small amount of contrast was injected via the existing percutaneous drainage catheter and several fluoroscopic images were obtained  in various obliquities. The external portion of the percutaneous drainage catheter was cut and cannulated with a short Amplatz wire. Under intermittent fluoroscopic guidance, the existing percutaneous drainage catheter was exchanged for a new, slightly larger 12 Fr percutaneous drainage catheter with end coiled and locked within the abscess cavity. Contrast injection confirmed appropriate position functionality of the percutaneous drainage catheter. The percutaneous drainage catheter was connected to a bulb suction and secured in place within interrupted suture and a StatLock device. A dressing was applied. The patient tolerated the procedure well without immediate postprocedural complication. FINDINGS: Similar positioning of LEFT lateral-approach pelvic drainage catheter. Upsize with repositioning attempt into the abscess collection. IMPRESSION: Successful fluoroscopic-guided upsize, and attempt at repositioning of, a percutaneous pelvic abscess drainage catheter. PLAN: The patient will return to Vascular Interventional Radiology (VIR) for routine drainage catheter evaluation in 7-10 days. Art Largo, MD [facility]iology Special[facility] Radiology Electronically Signed   By: Art Largo M.D.   On: 12/10/2023 14:27   IR Radiologist Eval & Mgmt Result Date: 12/10/2023 EXAM: ESTABLISHED PATIENT VISIT CHIEF COMPLAINT: See below HISTORY OF PRESENT ILLNESS: See below REVIEW OF SYSTEMS: See below PHYSICAL EXAMINATION: See below ASSESSMENT AND PLAN: Please refer to completed note in the electronic medical record on [facility] Epic Art Largo, MD  [facility] and Interventional Radiology Specialists [facility] Radiology Electronically Signed   By: Art Largo M.D.   On: 12/10/2023 09:27    Microbiology: Results for orders placed or performed during the hospital encounter of 12/30/23  Blood culture (routine x 2)     Status: None (Preliminary result)   Collection Time: 12/30/23  8:06 PM   Specimen: BLOOD LEFT HAND  Result Value Ref Range Status   Specimen Description BLOOD LEFT HAND  Final   Special Requests   Final    BOTTLES DRAWN AEROBIC AND ANAEROBIC Blood Culture adequate volume   Culture   Final    NO GROWTH 3 DAYS Performed at [facility], [address] [place], [place] [address]    Report Status PENDING  Incomplete  Blood culture (routine x 2)     Status: None (Preliminary result)   Collection Time: 12/30/23  8:13 PM   Specimen: BLOOD RIGHT ARM  Result Value Ref Range Status   Specimen Description BLOOD RIGHT ARM  Final   Special Requests   Final    BOTTLES DRAWN AEROBIC AND ANAEROBIC Blood Culture results may not be optimal due to an inadequate volume of blood received in culture bottles   Culture   Final    NO GROWTH 3 DAYS Performed at [facility], [address] [place], [place] [address]    Report Status PENDING  Incomplete  Aerobic/Anaerobic Culture w Gram Stain (surgical/deep wound)     Status: None (Preliminary result)   Collection Time: 12/31/23  1:56 PM   Specimen: Abscess  Result Value Ref Range Status   Specimen Description ABSCESS ABDOMEN  Final   Special Requests NONE  Final   Gram Stain   Final    ABUNDANT WBC PRESENT, PREDOMINANTLY PMN ABUNDANT GRAM POSITIVE RODS    Culture   Final    RARE CITROBACTER BRAAKII CULTURE REINCUBATED FOR BETTER GROWTH Performed at [facility], [address] [place], [place] [address]    Report Status PENDING  Incomplete   Organism ID, Bacteria CITROBACTER BRAAKII  Final      Susceptibility   Citrobacter braakii - MIC*    CEFEPIME  <=0.12  SENSITIVE Sensitive     CEFTAZIDIME <=1 SENSITIVE Sensitive     CEFTRIAXONE <=0.25 SENSITIVE Sensitive     CIPROFLOXACIN  <=0.25 SENSITIVE Sensitive     GENTAMICIN <=1 SENSITIVE Sensitive     IMIPENEM 0.5 SENSITIVE Sensitive     TRIMETH /SULFA  <=20 SENSITIVE Sensitive     PIP/TAZO <=4 SENSITIVE Sensitive ug/mL    * RARE CITROBACTER BRAAKII    Labs: CBC: Recent Labs  Lab 12/30/23 2013 12/31/23 0347 01/01/24 0703 01/02/24 0414  WBC 16.3* 13.0* 13.4* 11.9*  NEUTROABS 12.5*  --   --   --   HGB 12.1 10.5* 11.5* 11.7*  HCT 36.0 32.4* 34.4* 34.5*  MCV 92.1 94.7 91.5 91.8  PLT 398 308 355 366   Basic Metabolic Panel: Recent Labs  Lab 12/30/23 2013 12/31/23 0347 12/31/23 1928 01/01/24 0703 01/02/24 0414  NA 135 136  --  134* 134*  K 2.9* 3.6  --  3.2* 3.4*  CL 99 101  --  98 100  CO2 26 24  --  24 25  GLUCOSE 136* 83  --  96 97  BUN <5* 5*  --  5* <5*  CREATININE 0.53 0.54  --  0.65 0.65  CALCIUM 9.4 8.4*  --  8.5* 8.3*  MG  --   --  1.8  --   --    Liver Function Tests: Recent Labs  Lab 12/30/23 2013  AST 24  ALT 31  ALKPHOS 99  BILITOT 0.7  PROT 7.8  ALBUMIN 3.4*   CBG: No results for input(s): "GLUCAP" in the last 168 hours.  Discharge time spent: 35 minutes.  Signed: Jodeane Mulligan, DO [facility] 01/02/2024

## 2024-01-03 LAB — AEROBIC/ANAEROBIC CULTURE W GRAM STAIN (SURGICAL/DEEP WOUND)

## 2024-01-04 LAB — CULTURE, BLOOD (ROUTINE X 2)
Culture: NO GROWTH
Culture: NO GROWTH
Special Requests: ADEQUATE

## 2024-01-08 ENCOUNTER — Other Ambulatory Visit

## 2024-01-15 ENCOUNTER — Other Ambulatory Visit: Payer: Self-pay

## 2024-01-15 ENCOUNTER — Ambulatory Visit
Admission: RE | Admit: 2024-01-15 | Discharge: 2024-01-15 | Disposition: A | Discharge status: Home / Self Care | Source: Ambulatory Visit | Attending: Radiology

## 2024-01-15 ENCOUNTER — Ambulatory Visit
Admission: RE | Admit: 2024-01-15 | Discharge: 2024-01-15 | Disposition: A | Discharge status: Home / Self Care | Source: Ambulatory Visit | Attending: Surgery

## 2024-01-15 DIAGNOSIS — K5792 Diverticulitis of intestine, part unspecified, without perforation or abscess without bleeding: Secondary | ICD-10-CM

## 2024-01-15 DIAGNOSIS — L0291 Cutaneous abscess, unspecified: Secondary | ICD-10-CM

## 2024-01-15 HISTORY — PX: IR RADIOLOGIST EVAL & MGMT: IMG5224

## 2024-01-15 MED ORDER — IOPAMIDOL (ISOVUE-300) INJECTION 61%
100.0000 mL | Freq: Once | INTRAVENOUS | Status: AC | PRN
  Administered 2024-01-15: 100 mL via INTRAVENOUS

## 2024-01-15 NOTE — Progress Notes (Signed)
 Referring Physician(s): Stechschulte,Paul J  Chief Complaint: The patient is seen in follow up today s/p diverticular abscess drain placed 12/31/23  History of present illness:  Faith Evans is a 43 y.o. female with history of diverticulosis known to Interventional Radiology from prior abdominal drain placement 11/24/23. At the time of drain placement, the collection was ill-defined as either pericolonic vs. Left adnexal. After initial placement there was adequate decompression of the superior aspect of the collection. At follow-up with IR 12/10/23 drain evaluation with CT showed incomplete resolution with suspected malposition and the patient underwent exchange and upsize from 10 to 12Fr drain. At her follow-up with IR 5/2, the suspected diverticular abscess had resolved and the remaining apparent left adnexal cyst was unchanged. Drain injection confirmed no residual abscess cavity and the drain was removed. Patient presented to ED 12/30/23 with abdominal pain and CT showed new infectious characteristics of the intra-abdominal fluid collection. IR consulted for aspiration and drainage and this was performed 12/31/23.    She was discharged from the hospital 01/02/24 and she presents today to the Interventional Radiology outpatient clinic for a drain evaluation. She reports low output from the drain for the past several days - approximately 3-5 ml daily. Output is a clear, pale yellow fluid. She is flushing the drain once daily with 5 ml NS. She denies abdominal pain, fevers, chills, nausea or vomiting. She states she feels very well.   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   Fatty liver    GERD (gastroesophageal reflux disease)    History of hemolysis, elevated liver enzymes, and low platelet (HELLP) syndrome 2007   had pre-eclampsia and Toxemia    Hypertension    Pneumonia     Past Surgical History:  Procedure Laterality Date   CESAREAN SECTION  2007   emergent d/t HELLP, Toxemia   CHOLECYSTECTOMY N/A 01/22/2023   Procedure: LAPAROSCOPIC CHOLECYSTECTOMY WITH ICG DYE;  Surgeon: Adalberto Acton, MD;  Location: [facility];  Service: General;  Laterality: N/A;   IR CATHETER TUBE CHANGE  12/10/2023   IR RADIOLOGIST EVAL & MGMT  12/10/2023   IR RADIOLOGIST EVAL & MGMT  12/19/2023   NO PAST SURGERIES     WISDOM TOOTH EXTRACTION      Allergies: Amoxicillin and Cinnamon  Medications: Prior to Admission medications   Medication Sig Start Date End Date Taking? Authorizing Provider  acetaminophen  (TYLENOL ) 500 MG tablet Take 1,000 mg by mouth every 6 (six) hours as needed for moderate pain (pain score 4-6).    [provider]  clotrimazole -betamethasone  (LOTRISONE ) cream Apply 1 Application topically daily. 12/26/23   Zorita Hiss, NP  ondansetron  (ZOFRAN ) 4 MG tablet Take 1 tablet (4 mg total) by mouth every 6 (six) hours as needed for nausea. 01/02/24   Jodeane Mulligan, DO  oxyCODONE  (OXY IR/ROXICODONE ) 5 MG immediate release tablet Take 1 tablet (5 mg total) by mouth every 4 (four) hours as needed for severe pain (pain score 7-10). 01/02/24   Jodeane Mulligan, DO  sodium chloride  flush (NS) 0.9 % SOLN 5 mLs by Intracatheter route every 8 (eight) hours. 01/02/24   Jodeane Mulligan, DO     Family History  Problem Relation Age of Onset   Cancer Mother    Seizures Father    Cancer Maternal Grandfather  Colon cancer Neg Hx    Rectal cancer Neg Hx    Stomach cancer Neg Hx    Esophageal cancer Neg Hx     Social History   Socioeconomic History   Marital status: Married    Spouse name: Not on file   Number of children: Not on file   Years of education: Not on file   Highest education level: 12th grade  Occupational History   Not on file  Tobacco Use   Smoking status: Never   Smokeless tobacco: Never  Vaping Use   Vaping  status: Never Used  Substance and Sexual Activity   Alcohol use: Yes    Comment: occasionally   Drug use: Never   Sexual activity: Yes  Other Topics Concern   Not on file  Social History Narrative   Not on file   Social Drivers of Health   Financial Resource Strain: Low Risk  (11/19/2023)   Overall Financial Resource Strain (CARDIA)    Difficulty of Paying Living Expenses: Not hard at all  Food Insecurity: No Food Insecurity (12/31/2023)   Hunger Vital Sign    Worried About Running Out of Food in the Last Year: Never true    Ran Out of Food in the Last Year: Never true  Transportation Needs: No Transportation Needs (12/31/2023)   PRAPARE - Administrator, Civil Service (Medical): No    Lack of Transportation (Non-Medical): No  Physical Activity: Insufficiently Active (11/19/2023)   Exercise Vital Sign    Days of Exercise per Week: 3 days    Minutes of Exercise per Session: 10 min  Stress: No Stress Concern Present (11/19/2023)   [employer] of Occupational Health - Occupational Stress Questionnaire    Feeling of Stress : Not at all  Social Connections: Moderately Isolated (12/31/2023)   Social Connection and Isolation Panel [NHANES]    Frequency of Communication with Friends and Family: More than three times a week    Frequency of Social Gatherings with Friends and Family: More than three times a week    Attends Religious Services: Never    Database administrator or Organizations: No    Attends Banker Meetings: Never    Marital Status: Married     Vital Signs: There were no vitals taken for this visit.  Physical Exam Constitutional:      General: She is not in acute distress.    Appearance: She is not ill-appearing.  HENT:     Mouth/Throat:     Mouth: Mucous membranes are moist.     Pharynx: Oropharynx is clear.  Cardiovascular:     Pulses: Normal pulses.  Pulmonary:     Effort: Pulmonary effort is normal.  Abdominal:     Palpations:  Abdomen is soft.     Tenderness: There is no abdominal tenderness.     Comments: LLQ drain to suction. Approximately 3-5 ml of clear, pale yellow fluid in bulb. Suture and stat-lock intact. Mild erythema and drainage at skin insertion site.   Skin:    General: Skin is warm and dry.  Neurological:     Mental Status: She is alert and oriented to person, place, and time.  Psychiatric:        Mood and Affect: Mood normal.        Behavior: Behavior normal.        Thought Content: Thought content normal.        Judgment: Judgment normal.     Imaging:  No results found.  Labs:  CBC: Recent Labs    12/30/23 2013 12/31/23 0347 01/01/24 0703 01/02/24 0414  WBC 16.3* 13.0* 13.4* 11.9*  HGB 12.1 10.5* 11.5* 11.7*  HCT 36.0 32.4* 34.4* 34.5*  PLT 398 308 355 366    COAGS: Recent Labs    11/24/23 0622  INR 1.1    BMP: Recent Labs    12/30/23 2013 12/31/23 0347 01/01/24 0703 01/02/24 0414  NA 135 136 134* 134*  K 2.9* 3.6 3.2* 3.4*  CL 99 101 98 100  CO2 26 24 24 25   GLUCOSE 136* 83 96 97  BUN <5* 5* 5* <5*  CALCIUM 9.4 8.4* 8.5* 8.3*  CREATININE 0.53 0.54 0.65 0.65  GFRNONAA >60 >60 >60 >60    LIVER FUNCTION TESTS: Recent Labs    11/23/23 0751 11/24/23 0622 11/25/23 0546 12/30/23 2013  BILITOT 0.4 0.5 0.5 0.7  AST 22 14* 11* 24  ALT 34 28 20 31   ALKPHOS 94 71 60 99  PROT 7.3 5.9* 5.6* 7.8  ALBUMIN 3.7 2.4* 2.2* 3.4*    Assessment:  43 year old female with a 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 Patient also has a history of left adnexal cyst. She is s/p initial drain placement in IR 11/24/23 with exchange/upsize 12/10/23. Follow up imaging 12/19/23 showed resolution of the fluid collection and contrast injection was negative for a fistula. The left adnexal cyst was still present. She returned to the ED 12/30/23 with abdominal pain and outpatient CT imaging that showed reaccummulation of the fluid collection as well as air/gas within the  left adnexal cyst. IR placed a new drain into the left adnexal abscess/diverticular abscess on 12/31/23 and she was discharged from the hospital 01/02/24.   CT imaging today shows resolution of the fluid collection and the left adnexal abscess has also resolved. She is feeling well and reports minimal drain output. Drain output is serous is nature. The drain was subsequently removed. The patient has a follow up appointment with her Surgeon in two weeks and she was encouraged to keep this appointment.   Post drain removal site care was discussed. Follow up with IR as needed.   Signed: Fawn Hooks, NP 01/15/2024, 9:18 AM   Please refer to Dr. Burnetta Cart attestation of this note for management and plan.

## 2024-04-08 ENCOUNTER — Ambulatory Visit: Payer: Self-pay

## 2024-04-08 NOTE — Telephone Encounter (Signed)
 FYI Only or Action Required?: Action required by provider: clinical question for provider and pt would like to request rx to help with UTI or a lab only visit for testing..  Patient was last seen in primary care on 12/29/2023 by Geofm Glade PARAS, MD.  Called Nurse Triage reporting Urinary Frequency.  Symptoms began yesterday.  Interventions attempted: Nothing.  Symptoms are: gradually worsening.  Triage Disposition: See Physician Within 24 Hours  Patient/caregiver understands and will follow disposition?: No, wishes to speak with PCP    pt wants to speak with a nurse for possible UTI no other symptoms besides frequent urge to urinate started yesterday  Reason for Disposition  Urinating more frequently than usual (i.e., frequency) OR new-onset of the feeling of an urgent need to urinate (i.e., urgency)  Answer Assessment - Initial Assessment Questions 1. SYMPTOM: What's the main symptom you're concerned about? (e.g., frequency, incontinence)     Frequency, urgency 2. ONSET: When did the    start?     yesterday 3. PAIN: Is there any pain? If Yes, ask: How bad is it? (Scale: 1-10; mild, moderate, severe)     0/10 4. CAUSE: What do you think is causing the symptoms?     Possible UTI 5. OTHER SYMPTOMS: Do you have any other symptoms? (e.g., blood in urine, fever, flank pain, pain with urination)     no 6. PREGNANCY: Is there any chance you are pregnant? When was your last menstrual period?     No   Possible UTI: pt stated she does not have a lot of time at work to take off: would like to do lab only visit for testing or see if rx can be sent in for patient.  Protocols used: Urinary Symptoms-A-AH

## 2024-04-09 ENCOUNTER — Ambulatory Visit: Admitting: Internal Medicine

## 2024-04-09 NOTE — Telephone Encounter (Signed)
 Patient calling in today stating around 5:45pm last night she received a mychart message with an after visit summary that states see physician within 24 hours. Patient agreeable to schedule a visit based on disposition from RN Angela's triage yesterday. Patient scheduled for visit this morning with Dr Norleen.

## 2024-05-03 ENCOUNTER — Other Ambulatory Visit (HOSPITAL_COMMUNITY): Payer: Self-pay | Admitting: Student

## 2024-05-03 DIAGNOSIS — K5720 Diverticulitis of large intestine with perforation and abscess without bleeding: Secondary | ICD-10-CM

## 2024-05-04 ENCOUNTER — Ambulatory Visit (HOSPITAL_COMMUNITY)
Admission: RE | Admit: 2024-05-04 | Discharge: 2024-05-04 | Disposition: A | Discharge status: Home / Self Care | Source: Ambulatory Visit | Attending: Student | Admitting: Student

## 2024-05-04 DIAGNOSIS — K5720 Diverticulitis of large intestine with perforation and abscess without bleeding: Secondary | ICD-10-CM | POA: Insufficient documentation

## 2024-05-04 MED ORDER — IOHEXOL 9 MG/ML PO SOLN
ORAL | Status: AC
Start: 2024-05-04 — End: 2024-05-04
  Filled 2024-05-04: qty 1000

## 2024-05-04 MED ORDER — IOHEXOL 9 MG/ML PO SOLN
500.0000 mL | ORAL | Status: AC
  Administered 2024-05-04 (×2): 500 mL via ORAL

## 2024-05-04 MED ORDER — IOHEXOL 300 MG/ML  SOLN
100.0000 mL | Freq: Once | INTRAMUSCULAR | Status: AC | PRN
Start: 2024-05-04 — End: 2024-05-04
  Administered 2024-05-04: 100 mL via INTRAVENOUS

## 2024-05-05 ENCOUNTER — Other Ambulatory Visit: Payer: Self-pay

## 2024-05-05 ENCOUNTER — Encounter (HOSPITAL_COMMUNITY): Payer: Self-pay

## 2024-05-05 ENCOUNTER — Telehealth (HOSPITAL_COMMUNITY): Payer: Self-pay

## 2024-05-05 ENCOUNTER — Inpatient Hospital Stay (HOSPITAL_COMMUNITY)
Admission: EM | Admit: 2024-05-05 | Discharge: 2024-05-07 | DRG: 391 | Disposition: A | Discharge status: Home / Self Care | Attending: Surgery | Admitting: Surgery

## 2024-05-05 ENCOUNTER — Encounter (HOSPITAL_COMMUNITY): Payer: Self-pay | Admitting: Surgery

## 2024-05-05 DIAGNOSIS — K651 Peritoneal abscess: Secondary | ICD-10-CM | POA: Diagnosis present

## 2024-05-05 DIAGNOSIS — Q338 Other congenital malformations of lung: Secondary | ICD-10-CM | POA: Diagnosis not present

## 2024-05-05 DIAGNOSIS — K5720 Diverticulitis of large intestine with perforation and abscess without bleeding: Secondary | ICD-10-CM | POA: Diagnosis present

## 2024-05-05 DIAGNOSIS — I10 Essential (primary) hypertension: Secondary | ICD-10-CM | POA: Diagnosis present

## 2024-05-05 DIAGNOSIS — Z880 Allergy status to penicillin: Secondary | ICD-10-CM | POA: Diagnosis not present

## 2024-05-05 DIAGNOSIS — K219 Gastro-esophageal reflux disease without esophagitis: Secondary | ICD-10-CM | POA: Diagnosis present

## 2024-05-05 LAB — CBC
HCT: 38.6 % (ref 36.0–46.0)
Hemoglobin: 12.8 g/dL (ref 12.0–15.0)
MCH: 30.4 pg (ref 26.0–34.0)
MCHC: 33.2 g/dL (ref 30.0–36.0)
MCV: 91.7 fL (ref 80.0–100.0)
Platelets: 355 K/uL (ref 150–400)
RBC: 4.21 MIL/uL (ref 3.87–5.11)
RDW: 13.2 % (ref 11.5–15.5)
WBC: 14.8 K/uL — ABNORMAL HIGH (ref 4.0–10.5)
nRBC: 0 % (ref 0.0–0.2)

## 2024-05-05 LAB — URINALYSIS, ROUTINE W REFLEX MICROSCOPIC
Bilirubin Urine: NEGATIVE
Glucose, UA: NEGATIVE mg/dL
Ketones, ur: NEGATIVE mg/dL
Nitrite: NEGATIVE
Protein, ur: NEGATIVE mg/dL
Specific Gravity, Urine: 1.008 (ref 1.005–1.030)
pH: 7 (ref 5.0–8.0)

## 2024-05-05 LAB — COMPREHENSIVE METABOLIC PANEL WITH GFR
ALT: 28 U/L (ref 0–44)
AST: 25 U/L (ref 15–41)
Albumin: 3.1 g/dL — ABNORMAL LOW (ref 3.5–5.0)
Alkaline Phosphatase: 71 U/L (ref 38–126)
Anion gap: 10 (ref 5–15)
BUN: 8 mg/dL (ref 6–20)
CO2: 24 mmol/L (ref 22–32)
Calcium: 8.4 mg/dL — ABNORMAL LOW (ref 8.9–10.3)
Chloride: 101 mmol/L (ref 98–111)
Creatinine, Ser: 0.75 mg/dL (ref 0.44–1.00)
GFR, Estimated: >60 mL/min (ref >60)
Glucose, Bld: 153 mg/dL — ABNORMAL HIGH (ref 70–99)
Potassium: 3.3 mmol/L — ABNORMAL LOW (ref 3.5–5.1)
Sodium: 135 mmol/L (ref 135–145)
Total Bilirubin: 0.4 mg/dL (ref 0.0–1.2)
Total Protein: 7.3 g/dL (ref 6.5–8.1)

## 2024-05-05 LAB — PHOSPHORUS: Phosphorus: 2.1 mg/dL — ABNORMAL LOW (ref 2.5–4.6)

## 2024-05-05 LAB — MAGNESIUM: Magnesium: 1.9 mg/dL (ref 1.7–2.4)

## 2024-05-05 MED ORDER — ONDANSETRON HCL 4 MG/2ML IJ SOLN: 4.0000 mg | Freq: Four times a day (QID) | INTRAMUSCULAR | Status: DC | PRN

## 2024-05-05 MED ORDER — LACTATED RINGERS IV SOLN: INTRAVENOUS | Status: AC

## 2024-05-05 MED ORDER — DIPHENHYDRAMINE HCL 25 MG PO CAPS: 25.0000 mg | ORAL_CAPSULE | Freq: Four times a day (QID) | ORAL | Status: DC | PRN

## 2024-05-05 MED ORDER — OXYCODONE HCL 5 MG PO TABS: 5.0000 mg | ORAL_TABLET | ORAL | Status: DC | PRN

## 2024-05-05 MED ORDER — ACETAMINOPHEN 325 MG PO TABS: 650.0000 mg | ORAL_TABLET | Freq: Four times a day (QID) | ORAL | Status: DC | PRN

## 2024-05-05 MED ORDER — METOPROLOL TARTRATE 5 MG/5ML IV SOLN: 5.0000 mg | Freq: Four times a day (QID) | INTRAVENOUS | Status: DC | PRN

## 2024-05-05 MED ORDER — DIPHENHYDRAMINE HCL 50 MG/ML IJ SOLN: 25.0000 mg | Freq: Four times a day (QID) | INTRAMUSCULAR | Status: DC | PRN

## 2024-05-05 MED ORDER — ONDANSETRON 4 MG PO TBDP: 4.0000 mg | ORAL_TABLET | Freq: Four times a day (QID) | ORAL | Status: DC | PRN

## 2024-05-05 MED ORDER — ACETAMINOPHEN 650 MG RE SUPP: 650.0000 mg | Freq: Four times a day (QID) | RECTAL | Status: DC | PRN

## 2024-05-05 MED ORDER — SODIUM CHLORIDE 0.9 % IV SOLN
2.0000 g | Freq: Three times a day (TID) | INTRAVENOUS | Status: DC
  Administered 2024-05-06 – 2024-05-07 (×5): 2 g via INTRAVENOUS
  Filled 2024-05-05 (×5): qty 12.5

## 2024-05-05 MED ORDER — HYDROMORPHONE HCL 1 MG/ML IJ SOLN: 0.5000 mg | INTRAMUSCULAR | Status: DC | PRN

## 2024-05-05 MED ORDER — ENOXAPARIN SODIUM 40 MG/0.4ML IJ SOSY
40.0000 mg | PREFILLED_SYRINGE | INTRAMUSCULAR | Status: DC
  Filled 2024-05-05: qty 0.4

## 2024-05-05 MED ORDER — METRONIDAZOLE 500 MG/100ML IV SOLN
500.0000 mg | Freq: Two times a day (BID) | INTRAVENOUS | Status: DC
  Administered 2024-05-06 (×3): 500 mg via INTRAVENOUS
  Filled 2024-05-05 (×3): qty 100

## 2024-05-05 NOTE — H&P (Signed)
 H&P Note  Faith Evans 1981/02/03  [id number].    Chief Complaint/Reason for Consult: Diverticulitis with abscess HPI:  Patient is a 43 year old female with history of diverticulitis with abscess vs TOA s/p drain placement 12/31/23 who is being direct admitted to the hospital for recurrent diverticulitis with abscess. She had an outpatient CT 9/16 that showed this. She was admitted earlier this year with the same and had IR drain placed 12/04/23 and then again during subsequent admission on 12/31/23. Drains grew streptococcus anginosis and citrobacter braakii respectively. She was seen back in the office 01/23/24 and most recent drain had been removed 1 week prior. She was feeling well at that time and advised to consider possible elective colectomy later this year to prevent further attacks. Patient started having lower abdominal pain and notified our office. Outpatient scan was ordered as mentioned. Currently she reports her symptoms are improved.  Denies nausea/vomiting/fever/chills. Denies pneumaturia, fecaluria, or recurring UTIs. Does not desire further fertility.  PMH otherwise significant for HTN, GERD, congenital malformation of right lung, anxiety. Prior abdominal surgery includes laparoscopic cholecystectomy and cesarean section. Not on blood thinners. Last colonoscopy was in January of 2024 and showed diverticulosis of descending and sigmoid colon with some mucosal congestion of sigmoid colon, sessile polyps removed from transverse colon.   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    Fatty liver    GERD (gastroesophageal reflux disease)    History of hemolysis, elevated liver enzymes, and low platelet (HELLP) syndrome 2007   had pre-eclampsia and Toxemia   Hypertension    Pneumonia     Past Surgical History:  Procedure Laterality Date   CESAREAN SECTION  2007   emergent d/t HELLP, Toxemia   CHOLECYSTECTOMY N/A 01/22/2023   Procedure: LAPAROSCOPIC CHOLECYSTECTOMY WITH ICG DYE;  Surgeon: Signe Mitzie DELENA, MD;  Location: [facility];  Service: General;  Laterality: N/A;   IR CATHETER TUBE CHANGE  12/10/2023   IR RADIOLOGIST EVAL & MGMT  12/10/2023   IR RADIOLOGIST EVAL & MGMT  12/19/2023   IR RADIOLOGIST EVAL & MGMT  01/15/2024   NO PAST SURGERIES     WISDOM TOOTH EXTRACTION      Social History:  reports that she has never smoked. She has never used smokeless tobacco. She reports current alcohol use. She reports that she does not use drugs.  Allergies:  Allergies  Allergen Reactions   Amoxicillin Rash   Cinnamon Rash    No medications prior to admission.    There were no vitals taken for this visit. Physical Exam:  General: pleasant, WD, female who is laying in bed in NAD HEENT: head is normocephalic, atraumatic.  Sclera are noninjected.  Heart: rrr Lungs:  Respiratory effort nonlabored Abd: soft, NT, ND, +BS, no masses, hernias, or organomegaly Psych: appropriate affect.   No results found for this or any previous visit (from the past 48 hours). CT ABDOMEN PELVIS W CONTRAST Result  Date: 05/04/2024 CLINICAL DATA:  Diverticulitis of colon with perforation EXAM: CT ABDOMEN AND PELVIS WITH CONTRAST TECHNIQUE: Multidetector CT imaging of the abdomen and pelvis was performed using the standard protocol following bolus administration of intravenous contrast. RADIATION DOSE REDUCTION: This exam was performed according to the departmental dose-optimization program which includes automated exposure control, adjustment of the mA and/or kV according to patient size and/or  use of iterative reconstruction technique. CONTRAST:  OMNIPAQUE  IOHEXOL  300 MG/ML  SOLN COMPARISON:  Ct abd/pelvis 01/15/24 FINDINGS: Lower chest: No acute abnormality. Hepatobiliary: No focal liver abnormality. No gallstones, gallbladder wall thickening, or pericholecystic fluid. No biliary dilatation. Pancreas: No focal lesion. Normal pancreatic contour. No surrounding inflammatory changes. No main pancreatic ductal dilatation. Spleen: Normal in size without focal abnormality. Adrenals/Urinary Tract: No adrenal nodule bilaterally. Bilateral kidneys enhance symmetrically. No hydronephrosis. No hydroureter. Urinary bladder wall thickening along the dome. Stomach/Bowel: Stomach is within normal limits. No evidence of small bowel wall thickening or dilatation. Colonic diverticulosis with sigmoid bowel wall thickening with paracolonic fat stranding. Interval development of a 5.2 x 4.8cm paracolonic/left adnexal fluid collection with peripheral enhancement. Associated loss of peritoneal fat between the inflamed sigmoid colon, abscess, and urinary bladder dome. Appendix appears normal. Vascular/Lymphatic: No abdominal aorta or iliac aneurysm. Prominent retroperitoneal lymph nodes. No abdominal, pelvic, or inguinal lymphadenopathy. Reproductive: Low lying T-shaped intrauterine device within the lower uterine segment endometrium and cervix. Otherwise uterus and right adnexa are unremarkable. Other: No intraperitoneal free fluid. No intraperitoneal free gas. No organized fluid collection. Musculoskeletal: No abdominal wall hernia or abnormality. No suspicious lytic or blastic osseous lesions. No acute displaced fracture. IMPRESSION: 1. Colonic diverticulosis with acute sigmoid diverticulitis with development of a 5.2 x 4.8cm paracolonic versus left adnexal/tube ovarian abscess. 2. Urinary bladder wall thickening along the dome with associated loss of peritoneal fat between the inflamed sigmoid colon, abscess, and  urinary bladder dome. Developing colovesical fistula not excluded. 3. Malpositioned low lying T-shaped intrauterine device. 4. Prominent retroperitoneal lymph nodes.  Findings may be reactive. Electronically Signed   By: Morgane  Naveau M.D.   On: 05/04/2024 19:23      Assessment/Plan Recurrent sigmoid diverticulitis with abscess - outpatient CT 9/16 with above, development of 5.2 x 4.8 cm paracolonic vs left adnexal abscess - IV abx - will consult IR for possible drainage. Would likely plan to leave this drain in place however.  - No n/v, no apparent ileus; tolerating PO prior to arrival at hospital. Reg diet, npo after midnight  Admit to med-surg.   FEN: MIVF VTE: LMWH ID: [email or web address]   We spent time reviewing diverticulitis and imaging findings. She has had a lot of confusion as to whether this was diverticular in nature or adnexal. Notes hearing different stories from different docs. We discussed diverticulitis in detail; we discussed that it appears she likely has developed a fistula between her colon and her left adnexa. Abuts the bladder but so far does not have clinical evidence of a 'colovesical' fistula per se.   Discussed above treatment plans including abx, discussing percutaneous drainage with IR and if drain is placed, considering leaving in situ until planned surgery potentially. All of her and her husband's questions were answered to their satisfaction , they expressed understanding and agreement with the plan  I spent a total of 80 minutes today in both face-to-face and non-face-to-face activities to perform the following: review records, take and update history, examine the patient, counsel the patient on the diagnosis, and document encounter, findings, and  plan in the EHR  for this visit on the date of this encounter.   Lonni Pizza, MD General & Colorectal Surgeon [facility]

## 2024-05-05 NOTE — Telephone Encounter (Signed)
-----   Message from Wilkie POUR [facility] sent at 05/05/2024  1:21 PM EDT ----- Regarding: RE: IR drain placement Patient must be in-pt and receiving IV abx prior to abscess drain placement.  We don't do these as out-pt procedures.   HKM ----- Message ----- From: Carolee Rosina BIRCH Sent: 05/05/2024  11:28 AM EDT To: Ir Procedure Requests Subject: IR drain placement                             Procedure: IR drain placement  Dx: diverticulitis of colon with perforation, pericolonic abscess due to diverticulitis  Ordering: Tonja Shaper, PA [phone]  Imaging: CT abd done 9/16 in epic.   Please review.   Thanks,  Erwin

## 2024-05-06 ENCOUNTER — Inpatient Hospital Stay (HOSPITAL_COMMUNITY): Admitting: Anesthesiology

## 2024-05-06 ENCOUNTER — Encounter (HOSPITAL_COMMUNITY): Payer: Self-pay

## 2024-05-06 ENCOUNTER — Other Ambulatory Visit: Payer: Self-pay

## 2024-05-06 ENCOUNTER — Encounter (HOSPITAL_COMMUNITY): Admission: EM | Disposition: A | Discharge status: Home / Self Care | Payer: Self-pay | Source: Home / Self Care

## 2024-05-06 ENCOUNTER — Inpatient Hospital Stay (HOSPITAL_COMMUNITY)

## 2024-05-06 HISTORY — PX: RADIOLOGY WITH ANESTHESIA: SHX6223

## 2024-05-06 LAB — BASIC METABOLIC PANEL WITH GFR
Anion gap: 14 (ref 5–15)
BUN: 6 mg/dL (ref 6–20)
CO2: 23 mmol/L (ref 22–32)
Calcium: 8.2 mg/dL — ABNORMAL LOW (ref 8.9–10.3)
Chloride: 99 mmol/L (ref 98–111)
Creatinine, Ser: 0.58 mg/dL (ref 0.44–1.00)
GFR, Estimated: >60 mL/min (ref >60)
Glucose, Bld: 87 mg/dL (ref 70–99)
Potassium: 3.5 mmol/L (ref 3.5–5.1)
Sodium: 136 mmol/L (ref 135–145)

## 2024-05-06 LAB — CBC
HCT: 37.8 % (ref 36.0–46.0)
Hemoglobin: 12.6 g/dL (ref 12.0–15.0)
MCH: 30.7 pg (ref 26.0–34.0)
MCHC: 33.3 g/dL (ref 30.0–36.0)
MCV: 92 fL (ref 80.0–100.0)
Platelets: 342 K/uL (ref 150–400)
RBC: 4.11 MIL/uL (ref 3.87–5.11)
RDW: 13.2 % (ref 11.5–15.5)
WBC: 13 K/uL — ABNORMAL HIGH (ref 4.0–10.5)
nRBC: 0 % (ref 0.0–0.2)

## 2024-05-06 LAB — PREGNANCY, URINE: Preg Test, Ur: NEGATIVE

## 2024-05-06 LAB — PROTIME-INR
INR: 1.1 (ref 0.8–1.2)
Prothrombin Time: 14.3 s (ref 11.4–15.2)

## 2024-05-06 SURGERY — CT WITH ANESTHESIA
Anesthesia: General

## 2024-05-06 MED ORDER — SODIUM CHLORIDE 0.9% FLUSH
5.0000 mL | Freq: Three times a day (TID) | INTRAVENOUS | Status: DC
  Administered 2024-05-06 – 2024-05-07 (×3): 5 mL

## 2024-05-06 MED ORDER — CHLORHEXIDINE GLUCONATE 0.12 % MT SOLN
OROMUCOSAL | Status: AC
  Administered 2024-05-06: 15 mL via OROMUCOSAL
  Filled 2024-05-06: qty 15

## 2024-05-06 MED ORDER — OXYCODONE HCL 5 MG/5ML PO SOLN: 5.0000 mg | Freq: Once | ORAL | Status: DC | PRN

## 2024-05-06 MED ORDER — FENTANYL CITRATE (PF) 100 MCG/2ML IJ SOLN
INTRAMUSCULAR | Status: DC | PRN
  Administered 2024-05-06: 100 ug via INTRAVENOUS

## 2024-05-06 MED ORDER — LACTATED RINGERS IV SOLN: INTRAVENOUS | Status: DC

## 2024-05-06 MED ORDER — LIDOCAINE HCL (CARDIAC) PF 100 MG/5ML IV SOSY
PREFILLED_SYRINGE | INTRAVENOUS | Status: DC | PRN
  Administered 2024-05-06: 50 mg via INTRAVENOUS

## 2024-05-06 MED ORDER — FENTANYL CITRATE (PF) 100 MCG/2ML IJ SOLN
INTRAMUSCULAR | Status: AC
  Filled 2024-05-06: qty 2

## 2024-05-06 MED ORDER — ORAL CARE MOUTH RINSE: 15.0000 mL | Freq: Once | OROMUCOSAL | Status: AC

## 2024-05-06 MED ORDER — ROCURONIUM BROMIDE 10 MG/ML (PF) SYRINGE
PREFILLED_SYRINGE | INTRAVENOUS | Status: DC | PRN
  Administered 2024-05-06: 50 mg via INTRAVENOUS

## 2024-05-06 MED ORDER — CHLORHEXIDINE GLUCONATE 0.12 % MT SOLN: 15.0000 mL | Freq: Once | OROMUCOSAL | Status: AC

## 2024-05-06 MED ORDER — SUGAMMADEX SODIUM 200 MG/2ML IV SOLN
INTRAVENOUS | Status: DC | PRN
  Administered 2024-05-06: 200 mg via INTRAVENOUS

## 2024-05-06 MED ORDER — OXYCODONE HCL 5 MG PO TABS: 5.0000 mg | ORAL_TABLET | Freq: Once | ORAL | Status: DC | PRN

## 2024-05-06 MED ORDER — DEXAMETHASONE SODIUM PHOSPHATE 10 MG/ML IJ SOLN
INTRAMUSCULAR | Status: DC | PRN
  Administered 2024-05-06: 10 mg via INTRAVENOUS

## 2024-05-06 MED ORDER — PROPOFOL 10 MG/ML IV BOLUS
INTRAVENOUS | Status: DC | PRN
  Administered 2024-05-06: 200 mg via INTRAVENOUS

## 2024-05-06 MED ORDER — FENTANYL CITRATE (PF) 100 MCG/2ML IJ SOLN: 25.0000 ug | INTRAMUSCULAR | Status: DC | PRN

## 2024-05-06 MED ORDER — AMISULPRIDE (ANTIEMETIC) 5 MG/2ML IV SOLN: 10.0000 mg | Freq: Once | INTRAVENOUS | Status: DC | PRN

## 2024-05-06 MED ORDER — ENOXAPARIN SODIUM 40 MG/0.4ML IJ SOSY
40.0000 mg | PREFILLED_SYRINGE | INTRAMUSCULAR | Status: DC
  Filled 2024-05-06: qty 0.4

## 2024-05-06 MED ORDER — MIDAZOLAM HCL 2 MG/2ML IJ SOLN
INTRAMUSCULAR | Status: AC
  Filled 2024-05-06: qty 2

## 2024-05-06 MED ORDER — SODIUM CHLORIDE 0.9 % IV SOLN: 12.5000 mg | INTRAVENOUS | Status: DC | PRN

## 2024-05-06 MED ORDER — ONDANSETRON HCL 4 MG/2ML IJ SOLN
INTRAMUSCULAR | Status: DC | PRN
  Administered 2024-05-06: 4 mg via INTRAVENOUS

## 2024-05-06 NOTE — Anesthesia Preprocedure Evaluation (Addendum)
 Anesthesia Evaluation  Patient identified by MRN, date of birth, ID band Patient awake    Reviewed: Allergy & Precautions, NPO status , Patient's Chart, lab work & pertinent test results  History of Anesthesia Complications Negative for: history of anesthetic complications  Airway Mallampati: II  TM Distance: >3 FB Neck ROM: Full    Dental  (+) Dental Advisory Given, Teeth Intact   Pulmonary Patient abstained from smoking.  Congenital pulmonary malformation - only has half of right lung    Pulmonary exam normal        Cardiovascular negative cardio ROS Normal cardiovascular exam     Neuro/Psych  PSYCHIATRIC DISORDERS Anxiety     negative neurological ROS     GI/Hepatic Neg liver ROS,GERD  Controlled,,  Endo/Other   Obesity   Renal/GU negative Renal ROS     Musculoskeletal negative musculoskeletal ROS (+)    Abdominal   Peds  Hematology negative hematology ROS (+)   Anesthesia Other Findings   Reproductive/Obstetrics                              Anesthesia Physical Anesthesia Plan  ASA: 2  Anesthesia Plan: General   Post-op Pain Management: Minimal or no pain anticipated   Induction: Intravenous  PONV Risk Score and Plan: 3 and Treatment may vary due to age or medical condition, Ondansetron , Dexamethasone  and Midazolam   Airway Management Planned: Oral ETT  Additional Equipment: None  Intra-op Plan:   Post-operative Plan: Extubation in OR  Informed Consent: I have reviewed the patients History and Physical, chart, labs and discussed the procedure including the risks, benefits and alternatives for the proposed anesthesia with the patient or authorized representative who has indicated his/her understanding and acceptance.     Dental advisory given  Plan Discussed with: CRNA and Anesthesiologist  Anesthesia Plan Comments:          Anesthesia Quick  Evaluation

## 2024-05-06 NOTE — Procedures (Signed)
 Pre procedural Dx: pelvic abscess Post procedural Dx: Same  Technically successful CT guided placed of a 10 Fr drainage catheter placement into the pelvic abscess yielding pus.    A representative aspirated sample was capped and sent to the laboratory for analysis.    EBL: Trace Complications: None immediate  KANDICE Banner, MD Pager #: [phone]

## 2024-05-06 NOTE — Progress Notes (Signed)
 Progress Note     Subjective: Patient denies pain. Reports many bowel movements yesterday. No BM today yet. Denies blood in her stools. Reports flatus yesterday. Denies nausea and vomiting. Patient currently NPO.  ROS  All negative with the exception of above.  Objective: Vital signs in last 24 hours: Temp:  [98.4 F (36.9 C)-98.7 F (37.1 C)] 98.7 F (37.1 C) (09/18 0724) Pulse Rate:  [72-103] 72 (09/18 0724) Resp:  [15-16] 16 (09/18 0724) BP: (115-146)/(71-97) 128/76 (09/18 0724) SpO2:  [98 %-99 %] 98 % (09/18 0724) Last BM Date : 05/05/24  Intake/Output from previous day: 09/17 0701 - 09/18 0700 In: -  Out: 300 [Urine:300] Intake/Output this shift: No intake/output data recorded.  PE: General: Pleasant female who is laying in bed in NAD. HEENT: Head is normocephalic, atraumatic.  Sclera are noninjected. Conjunctiva anicteric. EOMI. Heart: HR normal Lungs: Respiratory effort nonlabored Abd: Soft, NT, ND. No rebound tenderness or guarding. No masses, hernias, or organomegaly  Skin: Warm and dry Psych: A&Ox3 with an appropriate affect.    Lab Results:  Recent Labs    05/05/24 2035 05/06/24 0603  WBC 14.8* 13.0*  HGB 12.8 12.6  HCT 38.6 37.8  PLT 355 342   BMET Recent Labs    05/05/24 2035  NA 135  K 3.3*  CL 101  CO2 24  GLUCOSE 153*  BUN 8  CREATININE 0.75  CALCIUM 8.4*   PT/INR No results for input(s): LABPROT, INR in the last 72 hours. CMP     Component Value Date/Time   NA 135 05/05/2024 2035   K 3.3 (L) 05/05/2024 2035   CL 101 05/05/2024 2035   CO2 24 05/05/2024 2035   GLUCOSE 153 (H) 05/05/2024 2035   BUN 8 05/05/2024 2035   CREATININE 0.75 05/05/2024 2035   CALCIUM 8.4 (L) 05/05/2024 2035   PROT 7.3 05/05/2024 2035   ALBUMIN 3.1 (L) 05/05/2024 2035   AST 25 05/05/2024 2035   ALT 28 05/05/2024 2035   ALKPHOS 71 05/05/2024 2035   BILITOT 0.4 05/05/2024 2035   GFRNONAA >60 05/05/2024 2035   GFRAA >60 01/19/2017 0307    Lipase     Component Value Date/Time   LIPASE 12.0 07/26/2022 0939       Studies/Results: CT ABDOMEN PELVIS W CONTRAST Result Date: 05/04/2024 CLINICAL DATA:  Diverticulitis of colon with perforation EXAM: CT ABDOMEN AND PELVIS WITH CONTRAST TECHNIQUE: Multidetector CT imaging of the abdomen and pelvis was performed using the standard protocol following bolus administration of intravenous contrast. RADIATION DOSE REDUCTION: This exam was performed according to the departmental dose-optimization program which includes automated exposure control, adjustment of the mA and/or kV according to patient size and/or use of iterative reconstruction technique. CONTRAST:  OMNIPAQUE  IOHEXOL  300 MG/ML  SOLN COMPARISON:  Ct abd/pelvis 01/15/24 FINDINGS: Lower chest: No acute abnormality. Hepatobiliary: No focal liver abnormality. No gallstones, gallbladder wall thickening, or pericholecystic fluid. No biliary dilatation. Pancreas: No focal lesion. Normal pancreatic contour. No surrounding inflammatory changes. No main pancreatic ductal dilatation. Spleen: Normal in size without focal abnormality. Adrenals/Urinary Tract: No adrenal nodule bilaterally. Bilateral kidneys enhance symmetrically. No hydronephrosis. No hydroureter. Urinary bladder wall thickening along the dome. Stomach/Bowel: Stomach is within normal limits. No evidence of small bowel wall thickening or dilatation. Colonic diverticulosis with sigmoid bowel wall thickening with paracolonic fat stranding. Interval development of a 5.2 x 4.8cm paracolonic/left adnexal fluid collection with peripheral enhancement. Associated loss of peritoneal fat between the inflamed sigmoid colon, abscess, and  urinary bladder dome. Appendix appears normal. Vascular/Lymphatic: No abdominal aorta or iliac aneurysm. Prominent retroperitoneal lymph nodes. No abdominal, pelvic, or inguinal lymphadenopathy. Reproductive: Low lying T-shaped intrauterine device within the  lower uterine segment endometrium and cervix. Otherwise uterus and right adnexa are unremarkable. Other: No intraperitoneal free fluid. No intraperitoneal free gas. No organized fluid collection. Musculoskeletal: No abdominal wall hernia or abnormality. No suspicious lytic or blastic osseous lesions. No acute displaced fracture. IMPRESSION: 1. Colonic diverticulosis with acute sigmoid diverticulitis with development of a 5.2 x 4.8cm paracolonic versus left adnexal/tube ovarian abscess. 2. Urinary bladder wall thickening along the dome with associated loss of peritoneal fat between the inflamed sigmoid colon, abscess, and urinary bladder dome. Developing colovesical fistula not excluded. 3. Malpositioned low lying T-shaped intrauterine device. 4. Prominent retroperitoneal lymph nodes.  Findings may be reactive. Electronically Signed   By: Morgane  Naveau M.D.   On: 05/04/2024 19:23    Anti-infectives: Anti-infectives (From admission, onward)    Start     Dose/Rate Route Frequency Ordered Stop   05/05/24 2115  ceFEPIme  (MAXIPIME ) 2 g in sodium chloride  0.9 % 100 mL IVPB       Placed in And Linked Group   2 g 200 mL/hr over 30 Minutes Intravenous Every 8 hours 05/05/24 2020 05/12/24 2159   05/05/24 2019  metroNIDAZOLE  (FLAGYL ) IVPB 500 mg       Placed in And Linked Group   500 mg 100 mL/hr over 60 Minutes Intravenous Every 12 hours 05/05/24 2020 05/13/24 0959        Assessment/Plan Recurrent sigmoid diverticulitis with abscess - Outpatient CT 9/16 with above, development of 5.2 x 4.8 cm paracolonic vs left adnexal abscess - Vitals stable. Afebrile. - WBC 13.0 from 14.8; HGB 12.6 - Denies pain. Having bowel function/flatus yesterday. Currently NPO - Continue IV abx - IR has been consulted for possible drainage. Would likely plan to leave this drain in place. - Patient has outpatient follow up arranged with Dr. Lyndel - Will continue to follow   FEN: MIVF (LR @ 100 mL/hr) VTE:  LMWH ID: [email or web address] /[email or web address]     LOS: 1 day   I reviewed specialist notes, nursing notes, last 24 h vitals and pain scores, last 48 h intake and output, last 24 h labs and trends, and last 24 h imaging results.  This care required moderate level of medical decision making.    Marjorie Carlyon Favre, [facility] 05/06/2024, 7:58 AM Please see Amion for pager number during day hours 7:00am-4:30pm

## 2024-05-06 NOTE — Sedation Documentation (Signed)
Patient under the care of anesthesia 

## 2024-05-06 NOTE — Anesthesia Procedure Notes (Signed)
 Procedure Name: Intubation Date/Time: 05/06/2024 1:44 PM  Performed by: Cindie Donald CROME, CRNAPre-anesthesia Checklist: Patient identified, Emergency Drugs available, Suction available and Patient being monitored Patient Re-evaluated:Patient Re-evaluated prior to induction Oxygen Delivery Method: Circle System Utilized Preoxygenation: Pre-oxygenation with 100% oxygen Induction Type: IV induction Ventilation: Mask ventilation without difficulty Laryngoscope Size: Mac and 3 Grade View: Grade I Tube type: Oral Tube size: 6.5 mm Number of attempts: 1 Airway Equipment and Method: Stylet Placement Confirmation: ETT inserted through vocal cords under direct vision, positive ETCO2 and breath sounds checked- equal and bilateral Secured at: 20 cm Tube secured with: Tape Dental Injury: Teeth and Oropharynx as per pre-operative assessment

## 2024-05-06 NOTE — Transfer of Care (Signed)
 Immediate Anesthesia Transfer of Care Note  Patient: Faith Evans  Procedure(s) Performed: CT WITH ANESTHESIA  Patient Location: PACU  Anesthesia Type:General  Level of Consciousness: awake, alert , oriented, and patient cooperative  Airway & Oxygen Therapy: Patient Spontanous Breathing  Post-op Assessment: Report given to RN and Post -op Vital signs reviewed and stable  Post vital signs: Reviewed and stable  Last Vitals:  Vitals Value Taken Time  BP 130/86 05/06/24 14:40  Temp 37.4 C 05/06/24 14:40  Pulse 94 05/06/24 14:41  Resp 21 05/06/24 14:41  SpO2 94 % 05/06/24 14:41  Vitals shown include unfiled device data.  Last Pain:  Vitals:   05/06/24 1149  TempSrc: Oral  PainSc:          Complications: No notable events documented.

## 2024-05-06 NOTE — Consult Note (Signed)
 Chief Complaint: Patient was seen in consultation today for intra-abdominal abscess  Referring Physician(s): Dr. Lyndel  Supervising Physician: Dr. Cordella Hamming  Patient Status: [facility] - In-pt  History of Present Illness: Faith Evans is a 43 y.o. female with history of diverticulosis known to Interventional Radiology from prior abdominal drain placement 11/24/23 and 12/31/23.  At the time of drain placement, the collection was ill-defined as either pericolonic vs. Left adnexal.  Drain placements thus far have adequately controlled and resolved her abscesses lending to appropriate removal of drains once therapeutic benefit achieved. Ultimately her plan was to proceed with surgical intervention, however she returns to [facility] ED with concern for repeat abscess.   CT imaging shows: 1. Colonic diverticulosis with acute sigmoid diverticulitis with development of a 5.2 x 4.8cm paracolonic versus left adnexal/tube ovarian abscess. 2. Urinary bladder wall thickening along the dome with associated loss of peritoneal fat between the inflamed sigmoid colon, abscess, and urinary bladder dome. Developing colovesical fistula not excluded. 3. Malpositioned low lying T-shaped intrauterine device. 4. Prominent retroperitoneal lymph nodes.  Findings may be reactive.  Case reviewed by Dr. Jenna who has approved replacement of her intra-abdominal drain.  Patient requests general anesthesia due to poor tolerance of drain placements x2 with conscious sedation.   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   Fatty liver    GERD (gastroesophageal reflux disease)    History of hemolysis, elevated liver enzymes, and low platelet (HELLP) syndrome 2007   had pre-eclampsia and Toxemia   Hypertension    Pneumonia     Past Surgical History:  Procedure Laterality  Date   CESAREAN SECTION  2007   emergent d/t HELLP, Toxemia   CHOLECYSTECTOMY N/A 01/22/2023   Procedure: LAPAROSCOPIC CHOLECYSTECTOMY WITH ICG DYE;  Surgeon: Signe Mitzie DELENA, MD;  Location: [facility] ORS;  Service: General;  Laterality: N/A;   IR CATHETER TUBE CHANGE  12/10/2023   IR RADIOLOGIST EVAL & MGMT  12/10/2023   IR RADIOLOGIST EVAL & MGMT  12/19/2023   IR RADIOLOGIST EVAL & MGMT  01/15/2024   NO PAST SURGERIES     WISDOM TOOTH EXTRACTION      Allergies: Amoxicillin and Cinnamon  Medications: Prior to Admission medications   Medication Sig Start Date End Date Taking? Authorizing Provider  acetaminophen  (TYLENOL ) 500 MG tablet Take 1,000 mg by mouth every 6 (six) hours as needed for moderate pain (pain score 4-6).   Yes [provider]  clotrimazole -betamethasone  (LOTRISONE ) cream Apply 1 Application topically daily. 12/26/23  Yes Elnor Lauraine BRAVO, NP  predniSONE  (DELTASONE ) 20 MG tablet Take 2 tablets (40 mg total) by mouth daily with breakfast. Patient not taking: Reported on 12/30/2023 12/26/23   Elnor Lauraine BRAVO, NP  sodium chloride  flush (NS) 0.9 % SOLN Flush drain every day with 5 mL normal saline. Patient not taking: Reported on 12/30/2023 11/26/23   Pommier, Wyatt H, PA-C  triamcinolone  cream (KENALOG ) 0.1 % Apply 1 application. topically 2 (two) times daily. To affected area till better Patient not taking: Reported on 12/30/2023 11/21/21   Vonna Sharlet POUR, MD     Family History  Problem Relation Age of Onset   Cancer Mother    Seizures Father    Cancer Maternal Grandfather    Colon cancer Neg Hx    Rectal cancer Neg Hx  Stomach cancer Neg Hx    Esophageal cancer Neg Hx     Social History   Socioeconomic History   Marital status: Married    Spouse name: Not on file   Number of children: Not on file   Years of education: Not on file   Highest education level: 12th grade  Occupational History   Not on file  Tobacco Use   Smoking status: Never   Smokeless tobacco:  Never  Vaping Use   Vaping status: Never Used  Substance and Sexual Activity   Alcohol use: Yes    Comment: occasionally   Drug use: Never   Sexual activity: Yes  Other Topics Concern   Not on file  Social History Narrative   Not on file   Social Drivers of Health   Financial Resource Strain: Low Risk  (11/19/2023)   Overall Financial Resource Strain (CARDIA)    Difficulty of Paying Living Expenses: Not hard at all  Food Insecurity: No Food Insecurity (05/05/2024)   Hunger Vital Sign    Worried About Running Out of Food in the Last Year: Never true    Ran Out of Food in the Last Year: Never true  Transportation Needs: No Transportation Needs (05/05/2024)   PRAPARE - Administrator, Civil Service (Medical): No    Lack of Transportation (Non-Medical): No  Physical Activity: Insufficiently Active (11/19/2023)   Exercise Vital Sign    Days of Exercise per Week: 3 days    Minutes of Exercise per Session: 10 min  Stress: No Stress Concern Present (11/19/2023)   [employer] of Occupational Health - Occupational Stress Questionnaire    Feeling of Stress : Not at all  Social Connections: Moderately Isolated (12/31/2023)   Social Connection and Isolation Panel    Frequency of Communication with Friends and Family: More than three times a week    Frequency of Social Gatherings with Friends and Family: More than three times a week    Attends Religious Services: Never    Database administrator or Organizations: No    Attends Banker Meetings: Never    Marital Status: Married     Review of Systems: A 12 point ROS discussed and pertinent positives are indicated in the HPI above.  All other systems are negative.  Review of Systems  Constitutional:  Negative for fatigue and fever.  Respiratory:  Negative for cough and shortness of breath.   Cardiovascular:  Negative for chest pain.  Gastrointestinal:  Negative for abdominal pain, nausea and vomiting.   Musculoskeletal:  Positive for back pain.  Psychiatric/Behavioral:  Negative for behavioral problems and confusion.     Vital Signs: BP 128/76 (BP Location: Right Arm)   Pulse 72   Temp 98.7 F (37.1 C) (Oral)   Resp 16   Ht 4' 9.09 (1.45 m)   SpO2 98%   BMI 33.01 kg/m   Physical Exam Vitals and nursing note reviewed.  Constitutional:      General: She is not in acute distress.    Appearance: Normal appearance. She is not ill-appearing.  HENT:     Mouth/Throat:     Mouth: Mucous membranes are moist.     Pharynx: Oropharynx is clear.  Cardiovascular:     Rate and Rhythm: Normal rate and regular rhythm.  Pulmonary:     Effort: Pulmonary effort is normal. No respiratory distress.     Breath sounds: Normal breath sounds.  Abdominal:     General: Abdomen  is flat. There is no distension.     Palpations: Abdomen is soft.     Tenderness: There is abdominal tenderness (lower abdominal).  Skin:    General: Skin is warm and dry.  Neurological:     General: No focal deficit present.     Mental Status: She is alert and oriented to person, place, and time. Mental status is at baseline.  Psychiatric:        Mood and Affect: Mood normal.        Behavior: Behavior normal.        Thought Content: Thought content normal.        Judgment: Judgment normal.      MD Evaluation Airway: WNL Heart: WNL Abdomen: WNL Chest/ Lungs: WNL ASA  Classification: 3 Mallampati/Airway Score: Two   Imaging: CT ABDOMEN PELVIS W CONTRAST Result Date: 05/04/2024 CLINICAL DATA:  Diverticulitis of colon with perforation EXAM: CT ABDOMEN AND PELVIS WITH CONTRAST TECHNIQUE: Multidetector CT imaging of the abdomen and pelvis was performed using the standard protocol following bolus administration of intravenous contrast. RADIATION DOSE REDUCTION: This exam was performed according to the departmental dose-optimization program which includes automated exposure control, adjustment of the mA and/or kV  according to patient size and/or use of iterative reconstruction technique. CONTRAST:  OMNIPAQUE  IOHEXOL  300 MG/ML  SOLN COMPARISON:  Ct abd/pelvis 01/15/24 FINDINGS: Lower chest: No acute abnormality. Hepatobiliary: No focal liver abnormality. No gallstones, gallbladder wall thickening, or pericholecystic fluid. No biliary dilatation. Pancreas: No focal lesion. Normal pancreatic contour. No surrounding inflammatory changes. No main pancreatic ductal dilatation. Spleen: Normal in size without focal abnormality. Adrenals/Urinary Tract: No adrenal nodule bilaterally. Bilateral kidneys enhance symmetrically. No hydronephrosis. No hydroureter. Urinary bladder wall thickening along the dome. Stomach/Bowel: Stomach is within normal limits. No evidence of small bowel wall thickening or dilatation. Colonic diverticulosis with sigmoid bowel wall thickening with paracolonic fat stranding. Interval development of a 5.2 x 4.8cm paracolonic/left adnexal fluid collection with peripheral enhancement. Associated loss of peritoneal fat between the inflamed sigmoid colon, abscess, and urinary bladder dome. Appendix appears normal. Vascular/Lymphatic: No abdominal aorta or iliac aneurysm. Prominent retroperitoneal lymph nodes. No abdominal, pelvic, or inguinal lymphadenopathy. Reproductive: Low lying T-shaped intrauterine device within the lower uterine segment endometrium and cervix. Otherwise uterus and right adnexa are unremarkable. Other: No intraperitoneal free fluid. No intraperitoneal free gas. No organized fluid collection. Musculoskeletal: No abdominal wall hernia or abnormality. No suspicious lytic or blastic osseous lesions. No acute displaced fracture. IMPRESSION: 1. Colonic diverticulosis with acute sigmoid diverticulitis with development of a 5.2 x 4.8cm paracolonic versus left adnexal/tube ovarian abscess. 2. Urinary bladder wall thickening along the dome with associated loss of peritoneal fat between the inflamed  sigmoid colon, abscess, and urinary bladder dome. Developing colovesical fistula not excluded. 3. Malpositioned low lying T-shaped intrauterine device. 4. Prominent retroperitoneal lymph nodes.  Findings may be reactive. Electronically Signed   By: Morgane  Naveau M.D.   On: 05/04/2024 19:23    Labs:  CBC: Recent Labs    01/01/24 0703 01/02/24 0414 05/05/24 2035 05/06/24 0603  WBC 13.4* 11.9* 14.8* 13.0*  HGB 11.5* 11.7* 12.8 12.6  HCT 34.4* 34.5* 38.6 37.8  PLT 355 366 355 342    COAGS: Recent Labs    11/24/23 0622  INR 1.1    BMP: Recent Labs    01/01/24 0703 01/02/24 0414 05/05/24 2035 05/06/24 0603  NA 134* 134* 135 136  K 3.2* 3.4* 3.3* 3.5  CL 98 100 101 99  CO2 24 25 24 23   GLUCOSE 96 97 153* 87  BUN 5* <5* 8 6  CALCIUM 8.5* 8.3* 8.4* 8.2*  CREATININE 0.65 0.65 0.75 0.58  GFRNONAA >60 >60 >60 >60    LIVER FUNCTION TESTS: Recent Labs    11/24/23 0622 11/25/23 0546 12/30/23 2013 05/05/24 2035  BILITOT 0.5 0.5 0.7 0.4  AST 14* 11* 24 25  ALT 28 20 31 28   ALKPHOS 71 60 99 71  PROT 5.9* 5.6* 7.8 7.3  ALBUMIN 2.4* 2.2* 3.4* 3.1*    TUMOR MARKERS: No results for input(s): AFPTM, CEA, CA199, CHROMGRNA in the last 8760 hours.  Assessment and Plan: Intra-abdominal fluid collection Patient with history of complex intra-abdominal fluid collections.  She underwent drain placement 4/7 and 5/14 with initial resolve of the suspected diverticular abscess.  Given her improvement on abx with stable imaging of the remaining collection, the drain was removed 01/15/24.  Unfortunately she has returned with abdominal pain and CT evidence of repeat collection.  IR consulted for aspiration and drainage.  Dr. Jenna has reviewed and approved patient for drain replacement.  Discussed with patient and her husband.   Patients requests anesthesia for procedure today.  Confirmed anesthesia is available and have posted case for pain management.   She is NPO. INR  pending.   Risks and benefits discussed with the patient including bleeding, infection, damage to adjacent structures, bowel perforation/fistula connection, and sepsis.  All of the patient's questions were answered, patient is agreeable to proceed. Consent signed and in chart.  Thank you for this interesting consult.  I greatly enjoyed meeting ANNICA MARINELLO and look forward to participating in their care.  A copy of this report was sent to the requesting provider on this date.  Electronically Signed: Jerene Yeager Sue-Ellen Shun Pletz, PA 05/06/2024, 10:56 AM   I spent a total of 40 Minutes    in face to face in clinical consultation, greater than 50% of which was counseling/coordinating care for intra-abdominal fluid collection.

## 2024-05-07 ENCOUNTER — Encounter (HOSPITAL_COMMUNITY): Payer: Self-pay

## 2024-05-07 LAB — BASIC METABOLIC PANEL WITH GFR
Anion gap: 6 (ref 5–15)
BUN: 7 mg/dL (ref 6–20)
CO2: 25 mmol/L (ref 22–32)
Calcium: 8.3 mg/dL — ABNORMAL LOW (ref 8.9–10.3)
Chloride: 106 mmol/L (ref 98–111)
Creatinine, Ser: 0.8 mg/dL (ref 0.44–1.00)
GFR, Estimated: >60 mL/min (ref >60)
Glucose, Bld: 130 mg/dL — ABNORMAL HIGH (ref 70–99)
Potassium: 4 mmol/L (ref 3.5–5.1)
Sodium: 137 mmol/L (ref 135–145)

## 2024-05-07 LAB — CBC
HCT: 41.8 % (ref 36.0–46.0)
Hemoglobin: 13.6 g/dL (ref 12.0–15.0)
MCH: 30.1 pg (ref 26.0–34.0)
MCHC: 32.5 g/dL (ref 30.0–36.0)
MCV: 92.5 fL (ref 80.0–100.0)
Platelets: 416 K/uL — ABNORMAL HIGH (ref 150–400)
RBC: 4.52 MIL/uL (ref 3.87–5.11)
RDW: 13 % (ref 11.5–15.5)
WBC: 15.2 K/uL — ABNORMAL HIGH (ref 4.0–10.5)
nRBC: 0 % (ref 0.0–0.2)

## 2024-05-07 MED ORDER — CIPROFLOXACIN HCL 500 MG PO TABS: 500.0000 mg | ORAL_TABLET | Freq: Two times a day (BID) | ORAL | 0 refills | Status: DC

## 2024-05-07 MED ORDER — METRONIDAZOLE 500 MG PO TABS: 500.0000 mg | ORAL_TABLET | Freq: Three times a day (TID) | ORAL | 0 refills | Status: DC

## 2024-05-07 MED ORDER — OXYCODONE-ACETAMINOPHEN 5-325 MG PO TABS: 1.0000 | ORAL_TABLET | ORAL | 0 refills | Status: DC | PRN

## 2024-05-07 NOTE — Plan of Care (Signed)
  Problem: Health Behavior/Discharge Planning: Goal: Ability to manage health-related needs will improve Outcome: Progressing   Problem: Clinical Measurements: Goal: Ability to maintain clinical measurements within normal limits will improve Outcome: Progressing Goal: Will remain free from infection Outcome: Progressing Goal: Diagnostic test results will improve Outcome: Progressing Goal: Respiratory complications will improve Outcome: Progressing Goal: Cardiovascular complication will be avoided Outcome: Progressing   Problem: Activity: Goal: Risk for activity intolerance will decrease Outcome: Progressing   Problem: Nutrition: Goal: Adequate nutrition will be maintained Outcome: Progressing   Problem: Coping: Goal: Level of anxiety will decrease Outcome: Progressing   Problem: Elimination: Goal: Will not experience complications related to bowel motility Outcome: Progressing   

## 2024-05-07 NOTE — Progress Notes (Signed)
 DISCHARGE NOTE HOME Faith Evans to be discharged Home per MD order. Discussed prescriptions and follow up appointments with the patient. Prescriptions given to patient; medication list explained in detail. Patient verbalized understanding.  Skin clean, dry and intact without evidence of skin break down, no evidence of skin tears noted. IV catheter discontinued intact. Site without signs and symptoms of complications. Dressing and pressure applied. Pt denies pain at the site currently. No complaints noted.  See LDA for drain and incision.  Patient free of other lines, drains, and wounds.   An After Visit Summary (AVS) was printed and given to the patient. Patient escorted via wheelchair, and discharged home via private auto.  [name], RN

## 2024-05-07 NOTE — Discharge Summary (Signed)
 Patient ID: Faith Evans [id number] 43 y.o. 03/08/1981  05/05/2024  Discharge date and time: 05/07/2024  Admitting Physician: Deward PARAS [name]  Discharge Physician: Deward PARAS [name]  Admission Diagnoses: Abscess of sigmoid colon due to diverticulitis [K57.20] Patient Active Problem List   Diagnosis Date Noted   Abscess of sigmoid colon due to diverticulitis 05/05/2024   Sigmoid diverticulitis 12/31/2023   Diverticulitis 12/31/2023   Poison ivy 12/26/2023   Colonic diverticular abscess 11/24/2023   Abscess 11/23/2023   Abdominal pain 11/19/2023   Hematuria 11/19/2023   Acute diverticulitis 10/06/2023   Left lower quadrant abdominal pain 10/06/2023   Elevated liver enzymes 05/22/2023   Class 1 obesity without serious comorbidity with body mass index (BMI) of 32.0 to 32.9 in adult 05/22/2023   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   Hypertensive disorder 05/22/2023   Encounter for general adult medical examination with abnormal findings 08/01/2022   Early satiety 05/30/2022   Class 2 obesity with body mass index (BMI) of 35.0 to 35.9 in adult 04/26/2022   Vitamin D  deficiency 04/26/2022   Elevated blood pressure reading 04/26/2022   Rash 04/26/2022   Hyperlipidemia 04/26/2022     Discharge Diagnoses:  Patient Active Problem List   Diagnosis Date Noted   Abscess of sigmoid colon due to diverticulitis 05/05/2024   Sigmoid diverticulitis 12/31/2023   Diverticulitis 12/31/2023   Poison ivy 12/26/2023   Colonic diverticular abscess 11/24/2023   Abscess 11/23/2023   Abdominal pain 11/19/2023   Hematuria 11/19/2023   Acute diverticulitis 10/06/2023   Left lower quadrant abdominal pain 10/06/2023   Elevated liver enzymes 05/22/2023   Class 1 obesity without serious comorbidity with body mass index (BMI) of 32.0 to 32.9 in adult 05/22/2023   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   Hypertensive disorder 05/22/2023   Encounter for general adult medical  examination with abnormal findings 08/01/2022   Early satiety 05/30/2022   Class 2 obesity with body mass index (BMI) of 35.0 to 35.9 in adult 04/26/2022   Vitamin D  deficiency 04/26/2022   Elevated blood pressure reading 04/26/2022   Rash 04/26/2022   Hyperlipidemia 04/26/2022    Operations: Procedure(s): CT WITH ANESTHESIA  Admission Condition: good  Discharged Condition: good  Indication for Admission: Diverticulitis  Hospital Course: Percutaneous drain placement in diverticular abscess  Consults: None  Significant Diagnostic Studies: CT  Treatments: IR Drain as above  Disposition: Home  Patient Instructions:  Allergies as of 05/07/2024       Reactions   Amoxicillin Rash   Cinnamon Rash        Medication List     TAKE these medications    acetaminophen  500 MG tablet Commonly known as: TYLENOL  Take 1,000 mg by mouth every 6 (six) hours as needed for headache or fever (pain).   ciprofloxacin  500 MG tablet Commonly known as: Cipro  Take 1 tablet (500 mg total) by mouth 2 (two) times daily.   metroNIDAZOLE  500 MG tablet Commonly known as: FLAGYL  Take 1 tablet (500 mg total) by mouth 3 (three) times daily.   oxyCODONE -acetaminophen  5-325 MG tablet Commonly known as: Percocet Take 1 tablet by mouth every 4 (four) hours as needed for severe pain (pain score 7-10).        Activity: no heavy lifting for 4 weeks Diet: regular diet Wound Care: keep wound clean and dry  Follow-up:  With Dr. Lyndel for surgery.  Signed: Deward PARAS [name] [facility] B[facility] Min[facility]ally In[facility], [place]   05/07/2024,  3:35 PM

## 2024-05-10 ENCOUNTER — Other Ambulatory Visit: Payer: Self-pay | Admitting: Surgery

## 2024-05-10 ENCOUNTER — Telehealth: Payer: Self-pay | Admitting: *Deleted

## 2024-05-10 DIAGNOSIS — K5720 Diverticulitis of large intestine with perforation and abscess without bleeding: Secondary | ICD-10-CM

## 2024-05-10 LAB — AEROBIC/ANAEROBIC CULTURE W GRAM STAIN (SURGICAL/DEEP WOUND)

## 2024-05-10 NOTE — Transitions of Care (Post Inpatient/ED Visit) (Signed)
 05/10/2024  Name: Faith Evans MRN: [id number] DOB: 02-08-1981  Today's TOC FU Call Status: Today's TOC FU Call Status:: Successful TOC FU Call Completed TOC FU Call Complete Date: 05/10/24 Patient's Name and Date of Birth confirmed.  Transition Care Management Follow-up Telephone Call Date of Discharge: 05/07/24 Discharge Facility: [facility]) Type of Discharge: Inpatient Admission Primary Inpatient Discharge Diagnosis:: colon abscess secondary to diverticulitis with drain placement How have you been since you were released from the hospital?: Better (I am doing fine- already back at work; I have had 3 drains this year due to my diverticulitis- I am perfectly comfortable managing the drain.  My surgeon will be calling me soon to schedule the surgery I need- surgery should happen in about 4 weeks) Any questions or concerns?: No  Items Reviewed: Did you receive and understand the discharge instructions provided?: Yes (thoroughly reviewed with patient who verbalizes good understanding of same) Medications obtained,verified, and reconciled?: Yes (Medications Reviewed) (Full medication reconciliation/ review completed; no concerns or discrepancies identified; confirmed patient obtained/ is taking all newly Rx'd medications as instructed; self-manages medications and denies questions/ concerns around medications today) Any new allergies since your discharge?: No Dietary orders reviewed?: Yes Type of Diet Ordered:: Diet for diverticulitis like I have been for a long time now Do you have support at home?: Yes People in Home [RPT]: spouse, child(ren), dependent Name of Support/Comfort Primary Source: Reports independent in self-care activities; resides with supportive spouse- assists as/ if needed/ indicated  Medications Reviewed Today: Medications Reviewed Today     Reviewed by Adriaan Maltese M, RN (Registered Nurse) on 05/10/24 at 1036  Med List Status: <None>   Medication Order  Taking? Sig Documenting Provider Last Dose Status Informant  acetaminophen  (TYLENOL ) 500 MG tablet [id number] Yes Take 1,000 mg by mouth every 6 (six) hours as needed for headache or fever (pain). [provider]  Active Self, Pharmacy Records  ciprofloxacin  (CIPRO ) 500 MG tablet [id number] Yes Take 1 tablet (500 mg total) by mouth 2 (two) times daily. Stechschulte, Deward PARAS, MD  Active   metroNIDAZOLE  (FLAGYL ) 500 MG tablet [id number] Yes Take 1 tablet (500 mg total) by mouth 3 (three) times daily. Stechschulte, Deward PARAS, MD  Active   oxyCODONE -acetaminophen  (PERCOCET) 5-325 MG tablet [id number] Yes Take 1 tablet by mouth every 4 (four) hours as needed for severe pain (pain score 7-10). Stechschulte, Deward PARAS, MD  Active            Home Care and Equipment/Supplies: Were Home Health Services Ordered?: No Any new equipment or medical supplies ordered?: No  Functional Questionnaire: Do you need assistance with bathing/showering or dressing?: No Do you need assistance with meal preparation?: No Do you need assistance with eating?: No Do you have difficulty maintaining continence: No Do you need assistance with getting out of bed/getting out of a chair/moving?: No Do you have difficulty managing or taking your medications?: No  Follow up appointments reviewed: PCP Follow-up appointment confirmed?: No (verified PCP HFU office visit not indicated per discharging hospital provider note post- recent hospitalization - patient declined assistance in scheduling with PCP: they didn't tell me to see my PCP:  just the surgeon, and I am doing fine) MD Provider Line Number:[phone] Given: No (verified well-established with current PCP) Specialist Hospital Follow-up appointment confirmed?: No (Reports currently waiting to hear from surgical provider to schedule the surgery I need for the diverticulitis- should happen in about 4 weeks when the antibiotics are done) Reason Specialist Follow-Up  Not  Confirmed: Patient has Specialist Provider Number and will Call for Appointment Do you need transportation to your follow-up appointment?: No Do you understand care options if your condition(s) worsen?: Yes-patient verbalized understanding  SDOH Interventions Today    Flowsheet Row Most Recent Value  SDOH Interventions   Food Insecurity Interventions Intervention Not Indicated  Housing Interventions Intervention Not Indicated  Transportation Interventions Intervention Not Indicated  [drives self]  Utilities Interventions Intervention Not Indicated   See TOC assessment tabs for additional assessment/ TOC intervention information  Patient declines need for ongoing/ further care management/ coordination outreach; declines enrollment in 30-day TOC program- declines taking my direct phone number should needs/ concerns arise post-TOC call   Pls call/ message for questions,  Amberlin Utke Mckinney Sakai Wolford, RN, BSN, Media planner  Transitions of Care  [facility] [phone]: direct office

## 2024-05-15 NOTE — Anesthesia Postprocedure Evaluation (Signed)
 Anesthesia Post Note  Patient: Faith Evans  Procedure(s) Performed: CT WITH ANESTHESIA     Patient location during evaluation: PACU Anesthesia Type: General Level of consciousness: awake and alert Pain management: pain level controlled Vital Signs Assessment: post-procedure vital signs reviewed and stable Respiratory status: spontaneous breathing, nonlabored ventilation and respiratory function stable Cardiovascular status: stable and blood pressure returned to baseline Anesthetic complications: no   No notable events documented.  Last Vitals:  Vitals:   05/06/24 1953 05/07/24 0758  BP: (!) 141/96 128/76  Pulse: 98 77  Resp: 17 15  Temp: 36.7 C 36.4 C  SpO2: 98% 97%    Last Pain:  Vitals:   05/07/24 1000  TempSrc:   PainSc: 0-No pain                 [name] Like

## 2024-05-19 ENCOUNTER — Ambulatory Visit: Payer: Self-pay | Admitting: Surgery

## 2024-05-19 DIAGNOSIS — Z01818 Encounter for other preprocedural examination: Secondary | ICD-10-CM

## 2024-05-19 NOTE — Progress Notes (Signed)
Sent message, via epic in basket, requesting order in epic from surgeon  

## 2024-05-25 NOTE — Patient Instructions (Signed)
 SURGICAL WAITING ROOM VISITATION Patients having surgery or a procedure may have no more than 2 support people in the waiting area - these visitors may rotate in the visitor waiting room.   If the patient needs to stay at the hospital during part of their recovery, the visitor guidelines for inpatient rooms apply.  PRE-OP VISITATION  Pre-op nurse will coordinate an appropriate time for 1 support person to accompany the patient in pre-op.  This support person may not rotate.  This visitor will be contacted when the time is appropriate for the visitor to come back in the pre-op area.  Please refer to the [facility] website for the visitor guidelines for Inpatients (after your surgery is over and you are in a regular room).  You are not required to quarantine at this time prior to your surgery. However, you must do this: Hand Hygiene often Do NOT share personal items Notify your provider if you are in close contact with someone who has COVID or you develop fever 100.4 or greater, new onset of sneezing, cough, sore throat, shortness of breath or body aches.  If you test positive for Covid or have been in contact with anyone that has tested positive in the last 10 days please notify you surgeon.    Your procedure is scheduled on:  Tuesday 06-01-2024  Report to [facility] Main Entrance: [address] entrance where the [employer] is available.   Report to admitting at: 10:45 AM  Call this number if you have any questions or problems the morning of surgery [phone]  FOLLOW ANY ADDITIONAL PRE OP INSTRUCTIONS YOU RECEIVED FROM YOUR SURGEON'S OFFICE!!!  DRINK two (2) bottles of Pre-Surgery Clear Ensure drink starting at 6:00 pm the evening prior to your surgery to help prevent dehydration. Increase drinking clear fluids (see list below)          Do not eat food after Midnight the night prior to your surgery/procedure.  After Midnight you may have the following liquids until  10:00 AM  DAY OF SURGERY  Clear Liquid Diet Water Black Coffee (sugar ok, NO MILK/CREAM OR CREAMERS)  Tea (sugar ok, NO MILK/CREAM OR CREAMERS) regular and decaf                             Plain Jell-O  with no fruit (NO RED)                                           Fruit ices (not with fruit pulp, NO RED)                                     Popsicles (NO RED)                                                                  Juice: NO CITRUS JUICES: only apple, WHITE grape, WHITE cranberry Sports drinks like Gatorade or Powerade (NO RED)  The day of surgery:  Drink ONE (1) Pre-Surgery Clear Ensure at 10:00  AM the morning of surgery. Drink in one sitting. Do not sip.  This drink was given to you during your hospital pre-op appointment visit. Nothing else to drink after completing the Pre-Surgery Clear Ensure: No candy, chewing gum or throat lozenges.     Oral Hygiene is also important to reduce your risk of infection.        Remember - BRUSH YOUR TEETH THE MORNING OF SURGERY WITH YOUR REGULAR TOOTHPASTE  Do NOT smoke after Midnight the night before surgery.  STOP TAKING all Vitamins, Herbs and supplements 1 week before your surgery.   Take ONLY these medicines the morning of surgery with A SIP OF WATER: Cipro ,  Flagyl  and you may take EITHER Tylenol  OR Oxycodone  APAP if needed for pain.    You may not have any metal on your body including hair pins, jewelry, and body piercing  Do not wear make-up, lotions, powders, perfumes or deodorant  Do not wear nail polish including gel and S&S, artificial / acrylic nails, or any other type of covering on natural nails including finger and toenails. If you have artificial nails, gel coating, etc., that needs to be removed by a nail salon, Please have this removed prior to surgery. Not doing so may mean that your surgery could be cancelled or delayed if the Surgeon or anesthesia staff feels like they are unable to monitor you safely.    Do not shave 48 hours prior to surgery to avoid nicks in your skin which may contribute to postoperative infections.   Contacts, Hearing Aids, dentures or bridgework may not be worn into surgery. DENTURES WILL BE REMOVED PRIOR TO SURGERY PLEASE DO NOT APPLY Poly grip OR ADHESIVES!!!  You may bring a small overnight bag with you on the day of surgery, only pack items that are not valuable. [facility] IS NOT RESPONSIBLE   FOR VALUABLES THAT ARE LOST OR STOLEN.   Do not bring your home medications to the hospital. The Pharmacy will dispense medications listed on your medication list to you during your admission in the Hospital.  Please read over the following fact sheets you were given: IF YOU HAVE QUESTIONS ABOUT YOUR PRE-OP INSTRUCTIONS, PLEASE CALL [phone]   [facility] - Preparing for Surgery Before surgery, you can play an important role.  Because skin is not sterile, your skin needs to be as free of germs as possible.  You can reduce the number of germs on your skin by washing with CHG (chlorahexidine gluconate) soap before surgery.  CHG is an antiseptic cleaner which kills germs and bonds with the skin to continue killing germs even after washing. Please DO NOT use if you have an allergy to CHG or antibacterial soaps.  If your skin becomes reddened/irritated stop using the CHG and inform your nurse when you arrive at Short Stay. Do not shave (including legs and underarms) for at least 48 hours prior to the first CHG shower.  You may shave your face/neck.  Please follow these instructions carefully:  1.  Shower with CHG Soap the night before surgery ONLY (DO NOT USE THE CHG SOAP THE MORNING OF SURGERY).  2.  If you choose to wash your hair, wash your hair first as usual with your normal  shampoo.  3.  After you shampoo, rinse your hair and body thoroughly to remove the shampoo.  4.  Use CHG as you would any other liquid soap.  You can apply chg directly  to the skin and wash.  Gently with a scrungie or clean washcloth.  5.  Apply the CHG Soap to your body ONLY FROM THE NECK DOWN.   Do not use on face/ open                           Wound or open sores. Avoid contact with eyes, ears mouth and genitals (private parts).                       Wash face,  Genitals (private parts) with your normal soap.             6.  Wash thoroughly, paying special attention to the area where your  surgery  will be performed.  7.  Thoroughly rinse your body with warm water from the neck down.  8.  DO NOT shower/wash with your normal soap after using and rinsing off the CHG Soap.                9.  Pat yourself dry with a clean towel.            10.  Wear clean pajamas.            11.  Place clean sheets on your bed the night of your first shower and do not  sleep with pets.  Day of Surgery : Do not apply any CHG, lotions/deodorants the morning of surgery.  Please wear clean clothes to the hospital/surgery center.   FAILURE TO FOLLOW THESE INSTRUCTIONS MAY RESULT IN THE CANCELLATION OF YOUR SURGERY  PATIENT SIGNATURE_________________________________  NURSE SIGNATURE__________________________________  ________________________________________________________________________       Faith Evans    An incentive spirometer is a tool that can help keep your lungs clear and active. This tool measures how well you are filling your lungs with each breath. Taking long deep breaths may help reverse or decrease the chance of developing breathing (pulmonary) problems (especially infection) following: A long period of time when you are unable to move or be active. BEFORE THE PROCEDURE  If the spirometer includes an indicator to show your best effort, your nurse or respiratory therapist will set it to a desired goal. If possible, sit up straight or lean slightly forward. Try not to slouch. Hold the incentive spirometer in an upright position. INSTRUCTIONS  FOR USE  Sit on the edge of your bed if possible, or sit up as far as you can in bed or on a chair. Hold the incentive spirometer in an upright position. Breathe out normally. Place the mouthpiece in your mouth and seal your lips tightly around it. Breathe in slowly and as deeply as possible, raising the piston or the ball toward the top of the column. Hold your breath for 3-5 seconds or for as long as possible. Allow the piston or ball to fall to the bottom of the column. Remove the mouthpiece from your mouth and breathe out normally. Rest for a few seconds and repeat Steps 1 through 7 at least 10 times every 1-2 hours when you are awake. Take your time and take a few normal breaths between deep breaths. The spirometer may include an indicator to show your best effort. Use the indicator as a goal to work toward during each repetition. After each set of 10 deep breaths, practice coughing  to be sure your lungs are clear. If you have an incision (the cut made at the time of surgery), support your incision when coughing by placing a pillow or rolled up towels firmly against it. Once you are able to get out of bed, walk around indoors and cough well. You may stop using the incentive spirometer when instructed by your caregiver.  RISKS AND COMPLICATIONS Take your time so you do not get dizzy or light-headed. If you are in pain, you may need to take or ask for pain medication before doing incentive spirometry. It is harder to take a deep breath if you are having pain. AFTER USE Rest and breathe slowly and easily. It can be helpful to keep track of a log of your progress. Your caregiver can provide you with a simple table to help with this. If you are using the spirometer at home, follow these instructions: SEEK MEDICAL CARE IF:  You are having difficultly using the spirometer. You have trouble using the spirometer as often as instructed. Your pain medication is not giving enough relief while using  the spirometer. You develop fever of 100.5 F (38.1 C) or higher.                                                                                                    SEEK IMMEDIATE MEDICAL CARE IF:  You cough up bloody sputum that had not been present before. You develop fever of 102 F (38.9 C) or greater. You develop worsening pain at or near the incision site. MAKE SURE YOU:  Understand these instructions. Will watch your condition. Will get help right away if you are not doing well or get worse. Document Released: 12/16/2006 Document Revised: 10/28/2011 Document Reviewed: 02/16/2007 [facility] Patient Information 2014 [place], [place].

## 2024-05-25 NOTE — Progress Notes (Signed)
 COVID Vaccine received:  [x]  No []  Yes Date of any COVID positive Test in last 90 days:  none   PCP -Lauraine Pereyra, NP  Cardiologist - none   Chest x-ray - 2018    2v  Epic EKG -11-23-2023  Epic Stress Test -  ECHO -  Cardiac Cath -    Bowel Prep - [x]  No  []   Yes ______   Pacemaker / ICD device [x]  No []  Yes   Spinal Cord Stimulator:[x]  No []  Yes       History of Sleep Apnea? [x]  No []  Yes   CPAP used?- [x]  No []  Yes     Does the patient monitor blood sugar?          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  Patient can  perform ADLs without assistance.    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 GERD, HTN, Fatty Liver, Has Kyleena IUD   Patient denies shortness of breath, fever, cough and chest pain at PAT appointment.   Patient verbalized understanding and agreement to the Pre-Surgical Instructions that were given to them at this PAT appointment. Patient was also educated of the need to review these PAT instructions again prior to her surgery.I reviewed the appropriate phone numbers to call if they have any and questions or concerns.

## 2024-05-26 ENCOUNTER — Other Ambulatory Visit: Payer: Self-pay

## 2024-05-26 ENCOUNTER — Encounter (HOSPITAL_COMMUNITY)
Admission: RE | Admit: 2024-05-26 | Discharge: 2024-05-26 | Disposition: A | Discharge status: Home / Self Care | Source: Ambulatory Visit | Attending: Surgery | Admitting: Surgery

## 2024-05-26 ENCOUNTER — Encounter (HOSPITAL_COMMUNITY): Payer: Self-pay | Admitting: *Deleted

## 2024-05-26 VITALS — BP 124/88 | Temp 98.7°F | Resp 16 | Ht <= 58 in | Wt 156.0 lb

## 2024-05-26 DIAGNOSIS — Z01818 Encounter for other preprocedural examination: Secondary | ICD-10-CM

## 2024-05-26 DIAGNOSIS — K769 Liver disease, unspecified: Secondary | ICD-10-CM | POA: Diagnosis not present

## 2024-05-26 DIAGNOSIS — R7989 Other specified abnormal findings of blood chemistry: Secondary | ICD-10-CM | POA: Insufficient documentation

## 2024-05-26 DIAGNOSIS — K5720 Diverticulitis of large intestine with perforation and abscess without bleeding: Secondary | ICD-10-CM | POA: Diagnosis not present

## 2024-05-26 DIAGNOSIS — I10 Essential (primary) hypertension: Secondary | ICD-10-CM | POA: Diagnosis not present

## 2024-05-26 DIAGNOSIS — Z01812 Encounter for preprocedural laboratory examination: Secondary | ICD-10-CM | POA: Diagnosis present

## 2024-05-26 LAB — COMPREHENSIVE METABOLIC PANEL WITH GFR
ALT: 13 U/L (ref 0–44)
AST: 19 U/L (ref 15–41)
Albumin: 4.1 g/dL (ref 3.5–5.0)
Alkaline Phosphatase: 46 U/L (ref 38–126)
Anion gap: 11 (ref 5–15)
BUN: 9 mg/dL (ref 6–20)
CO2: 25 mmol/L (ref 22–32)
Calcium: 9.2 mg/dL (ref 8.9–10.3)
Chloride: 104 mmol/L (ref 98–111)
Creatinine, Ser: 0.67 mg/dL (ref 0.44–1.00)
GFR, Estimated: >60 mL/min (ref >60)
Glucose, Bld: 96 mg/dL (ref 70–99)
Potassium: 4 mmol/L (ref 3.5–5.1)
Sodium: 139 mmol/L (ref 135–145)
Total Bilirubin: 0.5 mg/dL (ref 0.0–1.2)
Total Protein: 7.8 g/dL (ref 6.5–8.1)

## 2024-05-26 LAB — HEMOGLOBIN A1C
Hgb A1c MFr Bld: 5.5 % (ref 4.8–5.6)
Mean Plasma Glucose: 111.15 mg/dL

## 2024-05-26 LAB — CBC
HCT: 43.7 % (ref 36.0–46.0)
Hemoglobin: 13.6 g/dL (ref 12.0–15.0)
MCH: 29.1 pg (ref 26.0–34.0)
MCHC: 31.1 g/dL (ref 30.0–36.0)
MCV: 93.6 fL (ref 80.0–100.0)
Platelets: 338 K/uL (ref 150–400)
RBC: 4.67 MIL/uL (ref 3.87–5.11)
RDW: 13.7 % (ref 11.5–15.5)
WBC: 7.6 K/uL (ref 4.0–10.5)
nRBC: 0 % (ref 0.0–0.2)

## 2024-06-01 ENCOUNTER — Inpatient Hospital Stay (HOSPITAL_COMMUNITY): Admitting: Certified Registered"

## 2024-06-01 ENCOUNTER — Encounter (HOSPITAL_COMMUNITY)
Admission: RE | Disposition: A | Discharge status: Home / Self Care | Payer: Self-pay | Source: Home / Self Care | Attending: Surgery

## 2024-06-01 ENCOUNTER — Encounter (HOSPITAL_COMMUNITY): Payer: Self-pay | Admitting: Surgery

## 2024-06-01 ENCOUNTER — Inpatient Hospital Stay (HOSPITAL_COMMUNITY)
Admission: RE | Admit: 2024-06-01 | Discharge: 2024-06-03 | DRG: 331 | Disposition: A | Discharge status: Home / Self Care | Attending: Surgery | Admitting: Surgery

## 2024-06-01 ENCOUNTER — Other Ambulatory Visit: Payer: Self-pay

## 2024-06-01 ENCOUNTER — Inpatient Hospital Stay (HOSPITAL_COMMUNITY): Payer: Self-pay | Admitting: Medical

## 2024-06-01 DIAGNOSIS — Z01818 Encounter for other preprocedural examination: Secondary | ICD-10-CM

## 2024-06-01 DIAGNOSIS — K5732 Diverticulitis of large intestine without perforation or abscess without bleeding: Secondary | ICD-10-CM | POA: Diagnosis present

## 2024-06-01 DIAGNOSIS — K5792 Diverticulitis of intestine, part unspecified, without perforation or abscess without bleeding: Principal | ICD-10-CM | POA: Diagnosis present

## 2024-06-01 DIAGNOSIS — I10 Essential (primary) hypertension: Secondary | ICD-10-CM | POA: Diagnosis present

## 2024-06-01 HISTORY — PX: COLECTOMY, SIGMOID, ROBOT-ASSISTED: SHX7542

## 2024-06-01 LAB — CBC
HCT: 39.9 % (ref 36.0–46.0)
Hemoglobin: 12.6 g/dL (ref 12.0–15.0)
MCH: 29.2 pg (ref 26.0–34.0)
MCHC: 31.6 g/dL (ref 30.0–36.0)
MCV: 92.6 fL (ref 80.0–100.0)
Platelets: 292 K/uL (ref 150–400)
RBC: 4.31 MIL/uL (ref 3.87–5.11)
RDW: 13.2 % (ref 11.5–15.5)
WBC: 18 K/uL — ABNORMAL HIGH (ref 4.0–10.5)
nRBC: 0 % (ref 0.0–0.2)

## 2024-06-01 LAB — TYPE AND SCREEN
ABO/RH(D): A POS
Antibody Screen: NEGATIVE

## 2024-06-01 LAB — CREATININE, SERUM
Creatinine, Ser: 0.58 mg/dL (ref 0.44–1.00)
GFR, Estimated: >60 mL/min (ref >60)

## 2024-06-01 SURGERY — COLECTOMY, SIGMOID, ROBOT-ASSISTED
Anesthesia: General | Site: Abdomen

## 2024-06-01 MED ORDER — CHLORHEXIDINE GLUCONATE CLOTH 2 % EX PADS: 6.0000 | MEDICATED_PAD | Freq: Once | CUTANEOUS | Status: DC

## 2024-06-01 MED ORDER — FENTANYL CITRATE (PF) 100 MCG/2ML IJ SOLN
INTRAMUSCULAR | Status: DC | PRN
  Administered 2024-06-01: 100 ug via INTRAVENOUS

## 2024-06-01 MED ORDER — ACETAMINOPHEN 500 MG PO TABS
1000.0000 mg | ORAL_TABLET | ORAL | Status: AC
Start: 2024-06-01 — End: 2024-06-01
  Administered 2024-06-01: 1000 mg via ORAL
  Filled 2024-06-01: qty 2

## 2024-06-01 MED ORDER — MIDAZOLAM HCL 2 MG/2ML IJ SOLN
INTRAMUSCULAR | Status: AC
  Filled 2024-06-01: qty 2

## 2024-06-01 MED ORDER — PROPOFOL 10 MG/ML IV BOLUS
INTRAVENOUS | Status: DC | PRN
  Administered 2024-06-01: 150 mg via INTRAVENOUS

## 2024-06-01 MED ORDER — PROPOFOL 10 MG/ML IV BOLUS
INTRAVENOUS | Status: AC
  Filled 2024-06-01: qty 20

## 2024-06-01 MED ORDER — 0.9 % SODIUM CHLORIDE (POUR BTL) OPTIME
TOPICAL | Status: DC | PRN
  Administered 2024-06-01: 2000 mL

## 2024-06-01 MED ORDER — DEXAMETHASONE SOD PHOSPHATE PF 10 MG/ML IJ SOLN
INTRAMUSCULAR | Status: DC | PRN
Start: 2024-06-01 — End: 2024-06-01
  Administered 2024-06-01: 10 mg via INTRAVENOUS

## 2024-06-01 MED ORDER — ENSURE PRE-SURGERY PO LIQD: 296.0000 mL | Freq: Once | ORAL | Status: DC

## 2024-06-01 MED ORDER — BUPIVACAINE-EPINEPHRINE (PF) 0.25% -1:200000 IJ SOLN
INTRAMUSCULAR | Status: AC
  Filled 2024-06-01: qty 30

## 2024-06-01 MED ORDER — ORAL CARE MOUTH RINSE: 15.0000 mL | Freq: Once | OROMUCOSAL | Status: AC

## 2024-06-01 MED ORDER — ROCURONIUM BROMIDE 10 MG/ML (PF) SYRINGE
PREFILLED_SYRINGE | INTRAVENOUS | Status: AC
  Filled 2024-06-01: qty 10

## 2024-06-01 MED ORDER — LACTATED RINGERS IV SOLN: INTRAVENOUS | Status: DC

## 2024-06-01 MED ORDER — PHENYLEPHRINE 80 MCG/ML (10ML) SYRINGE FOR IV PUSH (FOR BLOOD PRESSURE SUPPORT)
PREFILLED_SYRINGE | INTRAVENOUS | Status: AC
  Filled 2024-06-01: qty 10

## 2024-06-01 MED ORDER — DROPERIDOL 2.5 MG/ML IJ SOLN: 0.6250 mg | Freq: Once | INTRAMUSCULAR | Status: DC | PRN

## 2024-06-01 MED ORDER — SODIUM CHLORIDE 0.9 % IV SOLN
2.0000 g | INTRAVENOUS | Status: AC
  Administered 2024-06-01: 2 g via INTRAVENOUS
  Filled 2024-06-01: qty 2

## 2024-06-01 MED ORDER — PROCHLORPERAZINE EDISYLATE 10 MG/2ML IJ SOLN: 10.0000 mg | INTRAMUSCULAR | Status: DC | PRN

## 2024-06-01 MED ORDER — LIDOCAINE HCL (PF) 2 % IJ SOLN
INTRAMUSCULAR | Status: DC | PRN
Start: 2024-06-01 — End: 2024-06-01
  Administered 2024-06-01: 60 mg via INTRADERMAL
  Administered 2024-06-01: 1.5 mg/kg/h via INTRADERMAL

## 2024-06-01 MED ORDER — KETAMINE HCL 50 MG/5ML IJ SOSY
PREFILLED_SYRINGE | INTRAMUSCULAR | Status: AC
  Filled 2024-06-01: qty 5

## 2024-06-01 MED ORDER — GABAPENTIN 300 MG PO CAPS
300.0000 mg | ORAL_CAPSULE | Freq: Three times a day (TID) | ORAL | Status: DC
  Administered 2024-06-01 – 2024-06-03 (×7): 300 mg via ORAL
  Filled 2024-06-01 (×4): qty 3
  Filled 2024-06-01: qty 1
  Filled 2024-06-01 (×2): qty 3

## 2024-06-01 MED ORDER — ONDANSETRON HCL 4 MG/2ML IJ SOLN: 4.0000 mg | Freq: Four times a day (QID) | INTRAMUSCULAR | Status: DC | PRN

## 2024-06-01 MED ORDER — PHENYLEPHRINE 80 MCG/ML (10ML) SYRINGE FOR IV PUSH (FOR BLOOD PRESSURE SUPPORT)
PREFILLED_SYRINGE | INTRAVENOUS | Status: DC | PRN
  Administered 2024-06-01 (×2): 80 ug via INTRAVENOUS
  Administered 2024-06-01 (×2): 160 ug via INTRAVENOUS
  Administered 2024-06-01: 80 ug via INTRAVENOUS

## 2024-06-01 MED ORDER — ENSURE SURGERY PO LIQD
237.0000 mL | Freq: Two times a day (BID) | ORAL | Status: DC
  Administered 2024-06-02: 237 mL via ORAL

## 2024-06-01 MED ORDER — ENSURE PRE-SURGERY PO LIQD: 592.0000 mL | Freq: Once | ORAL | Status: DC

## 2024-06-01 MED ORDER — OXYCODONE HCL 5 MG PO TABS: 5.0000 mg | ORAL_TABLET | ORAL | Status: DC | PRN

## 2024-06-01 MED ORDER — HYDROMORPHONE HCL 1 MG/ML IJ SOLN
INTRAMUSCULAR | Status: DC | PRN
  Administered 2024-06-01: 1 mg via INTRAVENOUS
  Administered 2024-06-01 (×2): .5 mg via INTRAVENOUS

## 2024-06-01 MED ORDER — SUGAMMADEX SODIUM 200 MG/2ML IV SOLN
INTRAVENOUS | Status: DC | PRN
  Administered 2024-06-01: 150 mg via INTRAVENOUS

## 2024-06-01 MED ORDER — GABAPENTIN 300 MG PO CAPS
300.0000 mg | ORAL_CAPSULE | ORAL | Status: AC
Start: 2024-06-01 — End: 2024-06-01
  Administered 2024-06-01: 300 mg via ORAL
  Filled 2024-06-01: qty 1

## 2024-06-01 MED ORDER — OXYCODONE HCL 5 MG PO TABS
10.0000 mg | ORAL_TABLET | ORAL | Status: DC | PRN
  Administered 2024-06-01: 10 mg via ORAL
  Filled 2024-06-01: qty 2

## 2024-06-01 MED ORDER — SIMETHICONE 80 MG PO CHEW
80.0000 mg | CHEWABLE_TABLET | Freq: Four times a day (QID) | ORAL | Status: DC | PRN
  Administered 2024-06-02: 80 mg via ORAL
  Filled 2024-06-01: qty 1

## 2024-06-01 MED ORDER — ALBUMIN HUMAN 5 % IV SOLN
INTRAVENOUS | Status: AC
  Filled 2024-06-01: qty 250

## 2024-06-01 MED ORDER — FENTANYL CITRATE (PF) 50 MCG/ML IJ SOSY
PREFILLED_SYRINGE | INTRAMUSCULAR | Status: AC
  Filled 2024-06-01: qty 3

## 2024-06-01 MED ORDER — BUPIVACAINE-EPINEPHRINE (PF) 0.25% -1:200000 IJ SOLN
INTRAMUSCULAR | Status: DC | PRN
  Administered 2024-06-01: 30 mL

## 2024-06-01 MED ORDER — ROCURONIUM BROMIDE 10 MG/ML (PF) SYRINGE
PREFILLED_SYRINGE | INTRAVENOUS | Status: AC
Start: 2024-06-01 — End: 2024-06-01
  Filled 2024-06-01: qty 10

## 2024-06-01 MED ORDER — ALVIMOPAN 12 MG PO CAPS
12.0000 mg | ORAL_CAPSULE | Freq: Two times a day (BID) | ORAL | Status: DC
  Administered 2024-06-02: 12 mg via ORAL
  Filled 2024-06-01: qty 1

## 2024-06-01 MED ORDER — HYDROMORPHONE HCL 2 MG/ML IJ SOLN
INTRAMUSCULAR | Status: AC
  Filled 2024-06-01: qty 1

## 2024-06-01 MED ORDER — METHOCARBAMOL 1000 MG/10ML IJ SOLN
500.0000 mg | Freq: Four times a day (QID) | INTRAMUSCULAR | Status: DC | PRN
  Administered 2024-06-01: 500 mg via INTRAVENOUS
  Filled 2024-06-01: qty 10

## 2024-06-01 MED ORDER — DOCUSATE SODIUM 100 MG PO CAPS
100.0000 mg | ORAL_CAPSULE | Freq: Two times a day (BID) | ORAL | Status: DC
  Administered 2024-06-01 – 2024-06-03 (×4): 100 mg via ORAL
  Filled 2024-06-01 (×4): qty 1

## 2024-06-01 MED ORDER — ACETAMINOPHEN 325 MG PO TABS
650.0000 mg | ORAL_TABLET | Freq: Four times a day (QID) | ORAL | Status: DC
  Administered 2024-06-01 – 2024-06-03 (×6): 650 mg via ORAL
  Filled 2024-06-01 (×8): qty 2

## 2024-06-01 MED ORDER — FENTANYL CITRATE (PF) 50 MCG/ML IJ SOSY
25.0000 ug | PREFILLED_SYRINGE | INTRAMUSCULAR | Status: DC | PRN
  Administered 2024-06-01 (×2): 50 ug via INTRAVENOUS

## 2024-06-01 MED ORDER — CHLORHEXIDINE GLUCONATE 0.12 % MT SOLN
15.0000 mL | Freq: Once | OROMUCOSAL | Status: AC
  Administered 2024-06-01: 15 mL via OROMUCOSAL

## 2024-06-01 MED ORDER — ONDANSETRON HCL 4 MG/2ML IJ SOLN
INTRAMUSCULAR | Status: DC | PRN
  Administered 2024-06-01: 4 mg via INTRAVENOUS

## 2024-06-01 MED ORDER — ONDANSETRON HCL 4 MG/2ML IJ SOLN
INTRAMUSCULAR | Status: AC
  Filled 2024-06-01: qty 2

## 2024-06-01 MED ORDER — LIDOCAINE HCL 2 % IJ SOLN
INTRAMUSCULAR | Status: AC
  Filled 2024-06-01: qty 20

## 2024-06-01 MED ORDER — HYDROMORPHONE HCL 1 MG/ML IJ SOLN
0.5000 mg | INTRAMUSCULAR | Status: DC | PRN
  Administered 2024-06-01: 0.5 mg via INTRAVENOUS
  Filled 2024-06-01: qty 0.5

## 2024-06-01 MED ORDER — ALVIMOPAN 12 MG PO CAPS
12.0000 mg | ORAL_CAPSULE | ORAL | Status: AC
Start: 2024-06-01 — End: 2024-06-01
  Administered 2024-06-01: 12 mg via ORAL
  Filled 2024-06-01: qty 1

## 2024-06-01 MED ORDER — ENOXAPARIN SODIUM 40 MG/0.4ML IJ SOSY
40.0000 mg | PREFILLED_SYRINGE | INTRAMUSCULAR | Status: DC
  Administered 2024-06-02: 40 mg via SUBCUTANEOUS
  Filled 2024-06-01 (×2): qty 0.4

## 2024-06-01 MED ORDER — HEPARIN SODIUM (PORCINE) 5000 UNIT/ML IJ SOLN
5000.0000 [IU] | Freq: Once | INTRAMUSCULAR | Status: AC
  Administered 2024-06-01: 5000 [IU] via SUBCUTANEOUS
  Filled 2024-06-01: qty 1

## 2024-06-01 MED ORDER — ALBUMIN HUMAN 5 % IV SOLN: INTRAVENOUS | Status: DC | PRN

## 2024-06-01 MED ORDER — MIDAZOLAM HCL 5 MG/5ML IJ SOLN
INTRAMUSCULAR | Status: DC | PRN
  Administered 2024-06-01: 2 mg via INTRAVENOUS

## 2024-06-01 MED ORDER — FENTANYL CITRATE (PF) 100 MCG/2ML IJ SOLN
INTRAMUSCULAR | Status: AC
  Filled 2024-06-01: qty 2

## 2024-06-01 MED ORDER — KETAMINE HCL 50 MG/5ML IJ SOSY
PREFILLED_SYRINGE | INTRAMUSCULAR | Status: DC | PRN
  Administered 2024-06-01 (×4): 10 mg via INTRAVENOUS

## 2024-06-01 MED ORDER — LACTATED RINGERS IR SOLN
Status: DC | PRN
  Administered 2024-06-01: 1000 mL

## 2024-06-01 MED ORDER — ROCURONIUM BROMIDE 10 MG/ML (PF) SYRINGE
PREFILLED_SYRINGE | INTRAVENOUS | Status: DC | PRN
  Administered 2024-06-01: 20 mg via INTRAVENOUS
  Administered 2024-06-01 (×4): 10 mg via INTRAVENOUS
  Administered 2024-06-01: 70 mg via INTRAVENOUS

## 2024-06-01 MED ORDER — CELECOXIB 200 MG PO CAPS
200.0000 mg | ORAL_CAPSULE | ORAL | Status: AC
Start: 2024-06-01 — End: 2024-06-01
  Administered 2024-06-01: 200 mg via ORAL
  Filled 2024-06-01: qty 1

## 2024-06-01 SURGICAL SUPPLY — 83 items
BAG COUNTER SPONGE SURGICOUNT (BAG) ×1 IMPLANT
BLADE EXTENDED COATED 6.5IN (ELECTRODE) IMPLANT
CANNULA REDUCER 12-8 DVNC XI (CANNULA) IMPLANT
CHLORAPREP W/TINT 26 (MISCELLANEOUS) IMPLANT
CLIP APPLIE 5 13 M/L LIGAMAX5 (MISCELLANEOUS) IMPLANT
CLIP APPLIE ROT 10 11.4 M/L (STAPLE) IMPLANT
CLIP LIGATING HEM O LOK PURPLE (MISCELLANEOUS) IMPLANT
COVER SURGICAL LIGHT HANDLE (MISCELLANEOUS) ×2 IMPLANT
COVER TIP SHEARS 8 DVNC (MISCELLANEOUS) ×1 IMPLANT
DEFOGGER SCOPE WARM SEASHARP (MISCELLANEOUS) ×1 IMPLANT
DERMABOND ADVANCED .7 DNX12 (GAUZE/BANDAGES/DRESSINGS) IMPLANT
DEVICE TROCAR PUNCTURE CLOSURE (ENDOMECHANICALS) IMPLANT
DRAIN CHANNEL 19F RND (DRAIN) IMPLANT
DRAPE ARM DVNC X/XI (DISPOSABLE) ×4 IMPLANT
DRAPE COLUMN DVNC XI (DISPOSABLE) ×1 IMPLANT
DRAPE CV SPLIT W-CLR ANES SCRN (DRAPES) ×1 IMPLANT
DRAPE PERI GROIN 82X75IN TIB (DRAPES) ×1 IMPLANT
DRAPE SURG IRRIG POUCH 19X23 (DRAPES) ×1 IMPLANT
DRIVER NDL LRG 8 DVNC XI (INSTRUMENTS) ×1 IMPLANT
DRIVER NDLE LRG 8 DVNC XI (INSTRUMENTS) ×1 IMPLANT
DRSG OPSITE POSTOP 4X10 (GAUZE/BANDAGES/DRESSINGS) IMPLANT
DRSG OPSITE POSTOP 4X6 (GAUZE/BANDAGES/DRESSINGS) IMPLANT
DRSG OPSITE POSTOP 4X8 (GAUZE/BANDAGES/DRESSINGS) IMPLANT
DRSG TEGADERM 2-3/8X2-3/4 SM (GAUZE/BANDAGES/DRESSINGS) ×5 IMPLANT
DRSG TEGADERM 4X4.75 (GAUZE/BANDAGES/DRESSINGS) IMPLANT
ELECT PENCIL ROCKER SW 15FT (MISCELLANEOUS) ×1 IMPLANT
ELECT REM PT RETURN 15FT ADLT (MISCELLANEOUS) ×1 IMPLANT
ENDOLOOP SUT PDS II 0 18 (SUTURE) IMPLANT
EVACUATOR SILICONE 100CC (DRAIN) IMPLANT
GAUZE SPONGE 2X2 8PLY STRL LF (GAUZE/BANDAGES/DRESSINGS) ×1 IMPLANT
GLOVE BIO SURGEON STRL SZ7.5 (GLOVE) ×3 IMPLANT
GLOVE INDICATOR 8.0 STRL GRN (GLOVE) ×3 IMPLANT
GOWN SRG XL LVL 4 BRTHBL STRL (GOWNS) ×1 IMPLANT
GOWN STRL REUS W/ TWL XL LVL3 (GOWN DISPOSABLE) ×4 IMPLANT
GRASPER SUT TROCAR 14GX15 (MISCELLANEOUS) IMPLANT
GRASPER TIP-UP FEN DVNC XI (INSTRUMENTS) ×1 IMPLANT
HOLDER FOLEY CATH W/STRAP (MISCELLANEOUS) ×1 IMPLANT
IRRIGATION SUCT STRKRFLW 2 WTP (MISCELLANEOUS) ×1 IMPLANT
KIT PROCEDURE DVNC SI (MISCELLANEOUS) IMPLANT
KIT SIGMOIDOSCOPE (SET/KITS/TRAYS/PACK) IMPLANT
KIT TURNOVER KIT A (KITS) ×1 IMPLANT
NDL INSUFFLATION 14GA 120MM (NEEDLE) ×1 IMPLANT
NEEDLE INSUFFLATION 14GA 120MM (NEEDLE) ×1 IMPLANT
OBTURATOR OPTICALSTD 8 DVNC (TROCAR) IMPLANT
PACK COLON (CUSTOM PROCEDURE TRAY) ×1 IMPLANT
PAD POSITIONING PINK XL (MISCELLANEOUS) ×1 IMPLANT
PROTECTOR NERVE ULNAR (MISCELLANEOUS) ×2 IMPLANT
RELOAD STAPLE 60 3.5 BLU DVNC (STAPLE) IMPLANT
RELOAD STAPLE 60 3.6 BLU REG (STAPLE) IMPLANT
RETRACTOR WND ALEXIS 18 MED (MISCELLANEOUS) IMPLANT
SCISSORS LAP 5X35 DISP (ENDOMECHANICALS) ×1 IMPLANT
SCISSORS MNPLR CVD DVNC XI (INSTRUMENTS) ×1 IMPLANT
SEAL UNIV 5-12 XI (MISCELLANEOUS) ×4 IMPLANT
SEALER VESSEL EXT DVNC XI (MISCELLANEOUS) ×1 IMPLANT
SOLUTION ELECTROSURG ANTI STCK (MISCELLANEOUS) ×1 IMPLANT
SPIKE FLUID TRANSFER (MISCELLANEOUS) ×1 IMPLANT
STAPLE ECHEON FLEX 60 POW ENDO (STAPLE) IMPLANT
STAPLER 60 SUREFORM DVNC (STAPLE) IMPLANT
STAPLER ECHELON POWER CIR 29 (STAPLE) IMPLANT
STAPLER ECHELON POWER CIR 31 (STAPLE) IMPLANT
STOPCOCK 4 WAY LG BORE MALE ST (IV SETS) ×2 IMPLANT
SURGILUBE 2OZ TUBE FLIPTOP (MISCELLANEOUS) IMPLANT
SUT MNCRL AB 4-0 PS2 18 (SUTURE) ×1 IMPLANT
SUT PDS AB 0 CT1 36 (SUTURE) IMPLANT
SUT PDS AB 1 CT1 27 (SUTURE) ×2 IMPLANT
SUT PROLENE 0 CT 2 (SUTURE) IMPLANT
SUT PROLENE 2 0 KS (SUTURE) IMPLANT
SUT PROLENE 2 0 SH DA (SUTURE) IMPLANT
SUT SILK 2 0 SH CR/8 (SUTURE) IMPLANT
SUT SILK 3 0 SH CR/8 (SUTURE) ×1 IMPLANT
SUT VIC AB 2-0 SH 18 (SUTURE) IMPLANT
SUT VIC AB 2-0 SH 27X BRD (SUTURE) IMPLANT
SUT VIC AB 3-0 SH 18 (SUTURE) IMPLANT
SUT VIC AB 3-0 SH 27XBRD (SUTURE) IMPLANT
SUT VICRYL 0 UR6 27IN ABS (SUTURE) IMPLANT
SUTURE V-LC BRB 180 2/0GR6GS22 (SUTURE) IMPLANT
SYR 20ML ECCENTRIC (SYRINGE) ×1 IMPLANT
SYSTEM LAPSCP GELPORT 120MM (MISCELLANEOUS) IMPLANT
SYSTEM WOUND ALEXIS 18CM MED (MISCELLANEOUS) ×1 IMPLANT
TRAY FOLEY MTR SLVR 16FR STAT (SET/KITS/TRAYS/PACK) ×1 IMPLANT
TROCAR ADV FIXATION 5X100MM (TROCAR) ×1 IMPLANT
TUBING CONNECTING 10 (TUBING) ×2 IMPLANT
TUBING INSUFFLATION 10FT LAP (TUBING) ×1 IMPLANT

## 2024-06-01 NOTE — Anesthesia Procedure Notes (Signed)
 Procedure Name: Intubation Date/Time: 06/01/2024 11:51 AM  Performed by: Etheleen Valtierra D, CRNAPre-anesthesia Checklist: Patient identified, Emergency Drugs available, Suction available and Patient being monitored Patient Re-evaluated:Patient Re-evaluated prior to induction Oxygen Delivery Method: Circle system utilized Preoxygenation: Pre-oxygenation with 100% oxygen Induction Type: IV induction Ventilation: Mask ventilation without difficulty Laryngoscope Size: Mac and 3 Grade View: Grade I Tube type: Oral Number of attempts: 1 Airway Equipment and Method: Stylet and Oral airway Placement Confirmation: ETT inserted through vocal cords under direct vision, positive ETCO2 and breath sounds checked- equal and bilateral Secured at: 21 cm Tube secured with: Tape Dental Injury: Teeth and Oropharynx as per pre-operative assessment

## 2024-06-01 NOTE — Anesthesia Postprocedure Evaluation (Signed)
 Anesthesia Post Note  Patient: Faith Evans  Procedure(s) Performed: COLECTOMY, SIGMOID, ROBOT-ASSISTED (Abdomen)     Patient location during evaluation: PACU Anesthesia Type: General Level of consciousness: awake and alert Pain management: pain level controlled Vital Signs Assessment: post-procedure vital signs reviewed and stable Respiratory status: spontaneous breathing, nonlabored ventilation, respiratory function stable and patient connected to nasal cannula oxygen Cardiovascular status: blood pressure returned to baseline and stable Postop Assessment: no apparent nausea or vomiting Anesthetic complications: no   No notable events documented.  Last Vitals:  Vitals:   06/01/24 1608 06/01/24 1615  BP:  110/80  Pulse: 93 74  Resp: (!) 24 18  Temp:    SpO2: 97% 97%    Last Pain:  Vitals:   06/01/24 1615  TempSrc:   PainSc: 3                  Arissa Fagin P Serrita Lueth

## 2024-06-01 NOTE — Transfer of Care (Signed)
 Immediate Anesthesia Transfer of Care Note  Patient: Faith Evans  Procedure(s) Performed: COLECTOMY, SIGMOID, ROBOT-ASSISTED (Abdomen)  Patient Location: PACU  Anesthesia Type:General  Level of Consciousness: awake  Airway & Oxygen Therapy: Patient Spontanous Breathing and Patient connected to face mask oxygen  Post-op Assessment: Report given to RN and Post -op Vital signs reviewed and stable  Post vital signs: Reviewed and stable  Last Vitals:  Vitals Value Taken Time  BP 118/79 06/01/24 15:33  Temp    Pulse 91 06/01/24 15:34  Resp 21 06/01/24 15:34  SpO2 99 % 06/01/24 15:34  Vitals shown include unfiled device data.  Last Pain:  Vitals:   06/01/24 1020  TempSrc: Oral  PainSc: 0-No pain         Complications: No notable events documented.

## 2024-06-01 NOTE — H&P (Signed)
 Admitting Physician: Deward PARAS Nikka Hakimian  Service: General Surgery  CC: Diverticulitis  Subjective   HPI: Faith Evans is an 43 y.o. 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   Fatty liver    GERD (gastroesophageal reflux disease)    History of hemolysis, elevated liver enzymes, and low platelet (HELLP) syndrome 2007   had pre-eclampsia and Toxemia   Hypertension    Pneumonia     Past Surgical History:  Procedure Laterality Date   CESAREAN SECTION  2007   emergent d/t HELLP, Toxemia   CHOLECYSTECTOMY N/A 01/22/2023   Procedure: LAPAROSCOPIC CHOLECYSTECTOMY WITH ICG DYE;  Surgeon: Signe Mitzie DELENA, MD;  Location: [facility];  Service: General;  Laterality: N/A;   IR CATHETER TUBE CHANGE  12/10/2023   IR RADIOLOGIST EVAL & MGMT  12/10/2023   IR RADIOLOGIST EVAL & MGMT  12/19/2023   IR RADIOLOGIST EVAL & MGMT  01/15/2024   RADIOLOGY WITH ANESTHESIA N/A 05/06/2024   Procedure: CT WITH ANESTHESIA;  Surgeon: Jenna Cordella DELENA, MD;  Location: [facility] OR;  Service: Radiology;  Laterality: N/A;   WISDOM TOOTH EXTRACTION      Family History  Problem Relation Age of Onset   Cancer Mother    Seizures Father    Cancer Maternal Grandfather    Colon cancer Neg Hx    Rectal cancer Neg Hx    Stomach cancer Neg Hx    Esophageal cancer Neg Hx     Social:  reports that she has never smoked. She has never used smokeless tobacco. She reports current alcohol use. She reports that she does not use drugs.  Allergies:  Allergies  Allergen Reactions   Amoxicillin Rash   Cinnamon Rash    Medications: Current Outpatient Medications  Medication Instructions   acetaminophen  (TYLENOL ) 1,000 mg, Every 6 hours PRN   ciprofloxacin  (CIPRO ) 500 mg, Oral, 2 times daily   fluconazole  (DIFLUCAN ) 150 mg, Daily    levonorgestrel (KYLEENA) 19.5 MG IUD 1 each,  Once   metroNIDAZOLE  (FLAGYL ) 500 mg, Oral, 3 times daily   [START ON 06/07/2024] norethindrone (MICRONOR) 0.35 MG tablet 1 tablet, Daily   oxyCODONE -acetaminophen  (PERCOCET) 5-325 MG tablet 1 tablet, Oral, Every 4 hours PRN    ROS - all of the below systems have been reviewed with the patient and positives are indicated with bold text General: chills, fever or night sweats Eyes: blurry vision or double vision ENT: epistaxis or sore throat Allergy/Immunology: itchy/watery eyes or nasal congestion Hematologic/Lymphatic: bleeding problems, blood clots or swollen lymph nodes Endocrine: temperature intolerance or unexpected weight changes Breast: new or changing breast lumps or nipple discharge Resp: cough, shortness of breath, or wheezing CV: chest pain or dyspnea on exertion GI: as per HPI GU: dysuria, trouble voiding, or hematuria MSK: joint pain or joint stiffness Neuro: TIA or stroke symptoms Derm: pruritus and skin lesion changes Psych: anxiety and depression  Objective   PE Blood pressure 135/89, pulse 85, temperature 97.6 F (36.4 C), temperature source Oral, resp. rate 16, height 4' 9.5 (1.461 m), weight 70.8 kg, SpO2 99%. Constitutional: NAD; conversant; no deformities Eyes: Moist conjunctiva; no lid lag; anicteric; PERRL Neck: Trachea midline; no thyromegaly Lungs: Normal respiratory effort; no tactile fremitus CV: RRR; no palpable thrills; no pitting edema GI: Abd  Lower abdominal pain; no palpable hepatosplenomegaly MSK: Normal range of motion of extremities; no clubbing/cyanosis Psychiatric: Appropriate affect; alert and oriented x3 Lymphatic: No palpable cervical or axillary lymphadenopathy  No results found for this or any previous visit (from the past 24 hours).  Imaging Orders  No imaging studies ordered today     Assessment and Plan   Faith Evans is an 42 y.o. female with complicated diverticulitis.  I  recommended robotic sigmoid colectomy.  We discussed the procedure, its risks, benefits and alternatives.  We discussed the risk of infection, bleeding, damage to nearby structures, anastomotic leak, and need for colostomy.  After a full discussion and all questions answered the patient granted consent to proceed.  Deward JINNY Foy, MD  [facility] Use [email or web address] to contact on call provider

## 2024-06-01 NOTE — Anesthesia Preprocedure Evaluation (Signed)
 Anesthesia Evaluation  Patient identified by MRN, date of birth, ID band Patient awake    Reviewed: Allergy & Precautions, NPO status , Patient's Chart, lab work & pertinent test results  Airway Mallampati: II  TM Distance: >3 FB Neck ROM: Full    Dental no notable dental hx.    Pulmonary neg pulmonary ROS   Pulmonary exam normal        Cardiovascular hypertension,  Rhythm:Regular Rate:Normal     Neuro/Psych   Anxiety     negative neurological ROS     GI/Hepatic Neg liver ROS,GERD  ,,  Endo/Other  negative endocrine ROS    Renal/GU negative Renal ROS  negative genitourinary   Musculoskeletal negative musculoskeletal ROS (+)    Abdominal Normal abdominal exam  (+)   Peds  Hematology Lab Results      Component                Value               Date                      WBC                      7.6                 05/26/2024                HGB                      13.6                05/26/2024                HCT                      43.7                05/26/2024                MCV                      93.6                05/26/2024                PLT                      338                 05/26/2024             Lab Results      Component                Value               Date                      NA                       139                 05/26/2024                K  4.0                 05/26/2024                CO2                      25                  05/26/2024                GLUCOSE                  96                  05/26/2024                BUN                      9                   05/26/2024                CREATININE               0.67                05/26/2024                CALCIUM                  9.2                 05/26/2024                GFR                      111.22              11/19/2023                GFRNONAA                 >60                 05/26/2024               Anesthesia Other Findings   Reproductive/Obstetrics                              Anesthesia Physical Anesthesia Plan  ASA: 3  Anesthesia Plan: General   Post-op Pain Management: Tylenol  PO (pre-op)* and Celebrex PO (pre-op)*   Induction: Intravenous  PONV Risk Score and Plan: 3 and Ondansetron , Dexamethasone , Midazolam  and Treatment may vary due to age or medical condition  Airway Management Planned: Mask and Oral ETT  Additional Equipment: None  Intra-op Plan:   Post-operative Plan: Extubation in OR  Informed Consent: I have reviewed the patients History and Physical, chart, labs and discussed the procedure including the risks, benefits and alternatives for the proposed anesthesia with the patient or authorized representative who has indicated his/her understanding and acceptance.     Dental advisory given  Plan Discussed with: CRNA  Anesthesia Plan Comments:         Anesthesia Quick Evaluation

## 2024-06-01 NOTE — Plan of Care (Signed)
  Problem: Education: Goal: Understanding of discharge needs will improve Outcome: Progressing Goal: Verbalization of understanding of the causes of altered bowel function will improve Outcome: Progressing   Problem: Activity: Goal: Ability to tolerate increased activity will improve Outcome: Progressing

## 2024-06-01 NOTE — Op Note (Signed)
 Patient: Faith Evans (1980/12/26, [id number])  Date of Surgery: 06/01/2024  Preoperative Diagnosis: COMPLICATED DIVERTICULITIS   Postoperative Diagnosis: COMPLICATED DIVERTICULITIS   Surgical Procedure:  ROBOTIC SIGMOID COLECTOMY MOBILIZATION OF SPLENIC FLEXURE    Operative Team Members:  Surgeons and Role:    * Kentrail Shew, Deward PARAS, MD - Primary   Anesthesiologist: Dorethea Cordella SQUIBB, DO CRNA: Vincenzo Show, CRNA; Therisa Doyal CROME, CRNA; Williford, Peggy D, CRNA   Anesthesia: General   Fluids:  Total I/O In: 1250 [I.V.:1000; IV Piggyback:250] Out: 875 [Urine:800; Blood:75]  Complications: None  Drains:  none   Specimen:  ID Type Source Tests Collected by Time Destination  1 : Sigmoid colon Tissue PATH GI Other SURGICAL PATHOLOGY Cathaleen Korol, Deward PARAS, MD 06/01/2024 1303      Disposition:  PACU - hemodynamically stable.  Plan of Care: Admit to inpatient     Indications for Procedure: Faith Evans is a 43 y.o. female who presented with complicated diverticulitis with IR drain in adnexal fluid colectomy.  She was unable to get fully better from multiple episodes of diverticulitis.  We decided to keep the drain in place and keep her on antibiotics until we can take her for elective sigmoid colectomy.  The procedure itself as well as its risks, benefits and alternatives were discussed.  The risks discussed included but were not limited to the risk of infection, bleeding, damage to nearby structures, and anastomotic leak.  After a full discussion and all questions answered the patient granted consent to proceed.  Findings: Severe sigmoid diverticulitis with drain in left adnexal inflammatory mass involving the fallopian tube and ovary   Description of Procedure:   On the date stated above the patient is taken operating room suite.  General endotracheal anesthesia was induced.  The patient was placed in lithotomy positioning.  Patient's abdomen is prepped and  draped in usual sterile fashion.  A timeout was completed verifying the correct patient, procedure, positioning, and equipment needed for the case.  I inflated the abdomen using a Veress needle at Palmer's point.  The abdomen was inflated to 15 mmHg and then 5 trocars were placed, four 8 mm trocars were placed from the Palmer's point incision down to the right lower quadrant for the robot.  A fifth 5 mm trocar was placed in the right flank for the assistant.  The patient was placed in Trendelenburg position and the sigmoid colon was inspected.  There were dense adhesions between the sigmoid colon and the left adnexa.  The IR drain was in place and was able to be seen traveling into this inflammatory mass.  The excised da Vinci robotic system was docked and we began operating robotically.  First I worked to [employer] the sigmoid colon and divide all attachments between the sigmoid colon and the left adnexal structures.  I began dividing the white line of Toldt laterally.  The left ureter was identified during this portion of the procedure and protected throughout the case.  The sigmoid colon was densely adherent to the adnexal structures and there was some difficulty but I was able to fully mobilize it off of the tube and ovary.  I did not encounter any purulent drainage from this area.  The IR drain was removed.  The track of the IR drain had no significant drainage.  I intervene further on the fallopian tube or ovary.  The sigmoid colon was then elevated and identified the sigmoid vascular bundle.  The artery was isolated and 2 clips were  placed on the artery and then it was divided.  The vein was divided using the vessel sealer.  The mesentery of the sigmoid colon was divided proximally distally from this point.  The mesentery was divided down to the rectum which was identified by splaying of the tinea and the sacral promontory.  This allowed me to fire a stapler across the proximal rectum.  The blue load of  the Ethicon endoscopic linear stapler was used.  2 loads of the stapler were used to divide the proximal rectum.  I then estimated the tension on the descending colon.  The descending colon proximal to the area of disease did not reach easily down to the pelvis so I decided to mobilize the splenic flexure.  The patient was repositioned into reverse Trendelenburg with right side down and I inspected the splenic flexure.  The omentum was divided off the distal transverse colon and mobilized out of the field.  The transverse colon was retracted inferiorly and the lesser sac was entered above the transverse colon.  I worked to divide the attachments to the transverse colon laterally, I was able to partially divide the splenocolic ligament from this direction.  The descending colon was then retracted medially and I continued my dissection of the white line of Toldt until the splenic flexure.  This dissection met up with the transverse colon dissection I was able to fully mobilize the splenocolic ligament.  The descending colon was then lifted out of the retroperitoneum by dividing the retroperitoneal attachments.  This fully mobilized the splenic flexure allowed for adequate length of descending colon to reach the colorectal anastomosis.  I then undocked the robot and created a Pfannenstiel incision dividing the skin and rectus fascia horizontally and the peritoneum vertically.  I entered the abdomen and placed a wound protector.  The sigmoid colon was delivered through the Pfannenstiel incision and a point proximal to the diseased segment of sigmoid colon was selected where there was good blood flow and the automatic pursestring device was used to clamp the bowel.  The bowel was divided.  A Prolene suture was placed as a pursestring and secured in place with multiple 2-0 Vicryl sutures.  The sizers were used and a 29 mm EEA stapler was selected.  The anvil was placed in the divided end of the descending colon and the  pursestring tied down around the anvil.  Some epiploic appendages were dissected free of the area of the staple line and then the descending colon was returned to the abdomen.  We returned to laparoscopic surgery.  I went below and introduced the sizers into the rectum.  It was easy to traverse up to the staple line with the 29 mm sizer.  There was a small tear in either the peritoneum or the rectal wall just anterior to the staple line.  Thankfully this is where I plan to deploy the stapler.  The stapler was introduced through the rectum and deployed through the small tear anterior to the staple line.  It was connected to the anvil and then fired.  The staple was removed.  There were 2 fully formed staple line donuts which were included in the specimen.  The rigid proctoscope was then introduced in the rectum and air was used to inflate the rectum while the anastomosis was submerged in saline intra-abdominal he.  There was no bubbles of air consistent with a negative leak test.  At this point we transition to a clean field, removed all dirty instruments,  re draped the patient, regowned and gloved.  The Pfannenstiel incision was closed in multiple layers using 2-0 Vicryl to close the peritoneum, 0 PDS to close the anterior rectus fascia, 2-0 Vicryl to close the deep dermal tissues.  And 4-0 Monocryl to close the skin.  4 Monocryl and Dermabond were used to close the ports.  All sponge needle counts were correct at the end of this case.  At the end of the case we reviewed the infection status of the case. 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

## 2024-06-02 ENCOUNTER — Encounter (HOSPITAL_COMMUNITY): Payer: Self-pay | Admitting: Surgery

## 2024-06-02 LAB — BASIC METABOLIC PANEL WITH GFR
Anion gap: 13 (ref 5–15)
BUN: 8 mg/dL (ref 6–20)
CO2: 24 mmol/L (ref 22–32)
Calcium: 8.8 mg/dL — ABNORMAL LOW (ref 8.9–10.3)
Chloride: 102 mmol/L (ref 98–111)
Creatinine, Ser: 0.51 mg/dL (ref 0.44–1.00)
GFR, Estimated: >60 mL/min (ref >60)
Glucose, Bld: 119 mg/dL — ABNORMAL HIGH (ref 70–99)
Potassium: 4 mmol/L (ref 3.5–5.1)
Sodium: 138 mmol/L (ref 135–145)

## 2024-06-02 LAB — CBC
HCT: 38.6 % (ref 36.0–46.0)
Hemoglobin: 12.6 g/dL (ref 12.0–15.0)
MCH: 30.1 pg (ref 26.0–34.0)
MCHC: 32.6 g/dL (ref 30.0–36.0)
MCV: 92.1 fL (ref 80.0–100.0)
Platelets: 306 K/uL (ref 150–400)
RBC: 4.19 MIL/uL (ref 3.87–5.11)
RDW: 13.1 % (ref 11.5–15.5)
WBC: 16.1 K/uL — ABNORMAL HIGH (ref 4.0–10.5)
nRBC: 0 % (ref 0.0–0.2)

## 2024-06-02 NOTE — Plan of Care (Signed)
  Problem: Activity: Goal: Ability to tolerate increased activity will improve Outcome: Progressing   Problem: Bowel/Gastric: Goal: Gastrointestinal status for postoperative course will improve Outcome: Progressing   Problem: Health Behavior/Discharge Planning: Goal: Identification of community resources to assist with postoperative recovery needs will improve Outcome: Progressing

## 2024-06-02 NOTE — Progress Notes (Signed)
 Progress Note: General Surgery Service   Chief Complaint/Subjective: Doing well POD1.  Low pain score.  Passing flatus, no bowel movements.   Objective: Vital signs in last 24 hours: Temp:  [96.6 F (35.9 C)-98.8 F (37.1 C)] 98.8 F (37.1 C) (10/15 0959) Pulse Rate:  [72-98] 91 (10/15 0959) Resp:  [14-24] 17 (10/15 0631) BP: (107-132)/(74-83) 132/83 (10/15 0959) SpO2:  [94 %-100 %] 95 % (10/15 0959) Weight:  [71.7 kg] 71.7 kg (10/15 0445) Last BM Date : 06/01/24  Intake/Output from previous day: 10/14 0701 - 10/15 0700 In: 2490 [P.O.:240; I.V.:1900; IV Piggyback:350] Out: 2975 [Urine:2900; Blood:75] Intake/Output this shift: Total I/O In: 360 [P.O.:360] Out: 0   Constitutional: NAD; conversant; no deformities Abd: soft, incisions c/d/i  Lab Results: CBC  Recent Labs    06/01/24 1624 06/02/24 0505  WBC 18.0* 16.1*  HGB 12.6 12.6  HCT 39.9 38.6  PLT 292 306   BMET Recent Labs    06/01/24 1624 06/02/24 0505  NA  --  138  K  --  4.0  CL  --  102  CO2  --  24  GLUCOSE  --  119*  BUN  --  8  CREATININE 0.58 0.51  CALCIUM  --  8.8*   PT/INR No results for input(s): LABPROT, INR in the last 72 hours. ABG No results for input(s): PHART, HCO3 in the last 72 hours.  Invalid input(s): PCO2, PO2  Anti-infectives: Anti-infectives (From admission, onward)    Start     Dose/Rate Route Frequency Ordered Stop   06/01/24 1130  cefoTEtan (CEFOTAN) 2 g in sodium chloride  0.9 % 100 mL IVPB        2 g 200 mL/hr over 30 Minutes Intravenous On call to O.R. 06/01/24 1022 06/01/24 1152       Medications: Scheduled Meds:  acetaminophen   650 mg Oral Q6H   alvimopan  12 mg Oral BID   docusate sodium   100 mg Oral BID   enoxaparin  (LOVENOX ) injection  40 mg Subcutaneous Q24H   feeding supplement  237 mL Oral BID BM   gabapentin   300 mg Oral TID   Continuous Infusions: PRN Meds:.HYDROmorphone  (DILAUDID ) injection, methocarbamol (ROBAXIN) injection,  ondansetron  (ZOFRAN ) IV, oxyCODONE , oxyCODONE , prochlorperazine, simethicone  Assessment/Plan: s/p Procedure(s): COLECTOMY, SIGMOID, ROBOT-ASSISTED 06/01/2024  Doing well POD1 Continue protocol Remove foley   LOS: 1 day      Deward JINNY Foy, MD  [facility] Use [email or web address] to contact on call provider  Daily Billing: [id number] - post op

## 2024-06-03 LAB — CBC
HCT: 35.9 % — ABNORMAL LOW (ref 36.0–46.0)
Hemoglobin: 11.4 g/dL — ABNORMAL LOW (ref 12.0–15.0)
MCH: 29.5 pg (ref 26.0–34.0)
MCHC: 31.8 g/dL (ref 30.0–36.0)
MCV: 93 fL (ref 80.0–100.0)
Platelets: 243 K/uL (ref 150–400)
RBC: 3.86 MIL/uL — ABNORMAL LOW (ref 3.87–5.11)
RDW: 13.4 % (ref 11.5–15.5)
WBC: 10.8 K/uL — ABNORMAL HIGH (ref 4.0–10.5)
nRBC: 0 % (ref 0.0–0.2)

## 2024-06-03 LAB — BASIC METABOLIC PANEL WITH GFR
Anion gap: 8 (ref 5–15)
BUN: 10 mg/dL (ref 6–20)
CO2: 27 mmol/L (ref 22–32)
Calcium: 8.7 mg/dL — ABNORMAL LOW (ref 8.9–10.3)
Chloride: 106 mmol/L (ref 98–111)
Creatinine, Ser: 0.5 mg/dL (ref 0.44–1.00)
GFR, Estimated: >60 mL/min
Glucose, Bld: 90 mg/dL (ref 70–99)
Potassium: 3.6 mmol/L (ref 3.5–5.1)
Sodium: 141 mmol/L (ref 135–145)

## 2024-06-03 LAB — SURGICAL PATHOLOGY

## 2024-06-03 MED ORDER — METHOCARBAMOL 500 MG PO TABS: 500.0000 mg | ORAL_TABLET | Freq: Four times a day (QID) | ORAL | 0 refills | Status: AC

## 2024-06-03 MED ORDER — OXYCODONE-ACETAMINOPHEN 5-325 MG PO TABS: 1.0000 | ORAL_TABLET | ORAL | 0 refills | Status: AC | PRN

## 2024-06-03 NOTE — Progress Notes (Signed)
 MD awaiting diet toleration

## 2024-06-03 NOTE — Plan of Care (Signed)
  Problem: Activity: Goal: Ability to tolerate increased activity will improve Outcome: Completed/Met   Problem: Bowel/Gastric: Goal: Gastrointestinal status for postoperative course will improve Outcome: Completed/Met   Problem: Activity: Goal: Risk for activity intolerance will decrease Outcome: Adequate for Discharge   Problem: Nutrition: Goal: Adequate nutrition will be maintained Outcome: Adequate for Discharge   Problem: Elimination: Goal: Will not experience complications related to bowel motility Outcome: Adequate for Discharge   Problem: Elimination: Goal: Will not experience complications related to urinary retention Outcome: Adequate for Discharge

## 2024-06-03 NOTE — Discharge Summary (Signed)
 Patient ID: Faith Evans [id number] 43 y.o. [birth year]  06/01/2024  Discharge date and time: 06/03/2024  Admitting Physician: Deward PARAS Ailee Pates  Discharge Physician: Deward PARAS Aisha Greenberger  Admission Diagnoses: Diverticulitis [K57.92] Patient Active Problem List   Diagnosis Date Noted   Abscess of sigmoid colon due to diverticulitis 05/05/2024   Sigmoid diverticulitis 12/31/2023   Diverticulitis 12/31/2023   Poison ivy 12/26/2023   Colonic diverticular abscess 11/24/2023   Abscess 11/23/2023   Abdominal pain 11/19/2023   Hematuria 11/19/2023   Acute diverticulitis 10/06/2023   Left lower quadrant abdominal pain 10/06/2023   Elevated liver enzymes 05/22/2023   Class 1 obesity without serious comorbidity with body mass index (BMI) of 32.0 to 32.9 in adult 05/22/2023   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   Hypertensive disorder 05/22/2023   Encounter for general adult medical examination with abnormal findings 08/01/2022   Early satiety 05/30/2022   Class 2 obesity with body mass index (BMI) of 35.0 to 35.9 in adult 04/26/2022   Vitamin D  deficiency 04/26/2022   Elevated blood pressure reading 04/26/2022   Rash 04/26/2022   Hyperlipidemia 04/26/2022     Discharge Diagnoses:  Patient Active Problem List   Diagnosis Date Noted   Abscess of sigmoid colon due to diverticulitis 05/05/2024   Sigmoid diverticulitis 12/31/2023   Diverticulitis 12/31/2023   Poison ivy 12/26/2023   Colonic diverticular abscess 11/24/2023   Abscess 11/23/2023   Abdominal pain 11/19/2023   Hematuria 11/19/2023   Acute diverticulitis 10/06/2023   Left lower quadrant abdominal pain 10/06/2023   Elevated liver enzymes 05/22/2023   Class 1 obesity without serious comorbidity with body mass index (BMI) of 32.0 to 32.9 in adult 05/22/2023   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   Hypertensive disorder 05/22/2023   Encounter for general adult medical examination with abnormal  findings 08/01/2022   Early satiety 05/30/2022   Class 2 obesity with body mass index (BMI) of 35.0 to 35.9 in adult 04/26/2022   Vitamin D  deficiency 04/26/2022   Elevated blood pressure reading 04/26/2022   Rash 04/26/2022   Hyperlipidemia 04/26/2022    Operations: Procedure(s): COLECTOMY, SIGMOID, ROBOT-ASSISTED  Admission Condition: good  Discharged Condition: good  Indication for Admission: Diverticulitis  Hospital Course: Robotic sigmoid colectomy  Consults: None  Significant Diagnostic Studies: None  Treatments: surgery: as above  Disposition: Home  Patient Instructions:  Allergies as of 06/03/2024       Reactions   Amoxicillin Rash   Cinnamon Rash        Medication List     STOP taking these medications    ciprofloxacin  500 MG tablet Commonly known as: Cipro    metroNIDAZOLE  500 MG tablet Commonly known as: FLAGYL        TAKE these medications    acetaminophen  500 MG tablet Commonly known as: TYLENOL  Take 1,000 mg by mouth every 6 (six) hours as needed for headache or fever (pain).   fluconazole  150 MG tablet Commonly known as: DIFLUCAN  Take 150 mg by mouth daily.   Kyleena 19.5 MG IUD Generic drug: levonorgestrel 1 each by Intrauterine route once.   methocarbamol 500 MG tablet Commonly known as: ROBAXIN Take 1 tablet (500 mg total) by mouth 4 (four) times daily.   norethindrone 0.35 MG tablet Commonly known as: MICRONOR Take 1 tablet by mouth daily. Starting after Colon Surgery on 06-01-24 Start taking on: June 07, 2024   oxyCODONE -acetaminophen  5-325 MG tablet Commonly known as: Percocet Take 1 tablet by mouth every 4 (four) hours  as needed for severe pain (pain score 7-10).        Activity: no heavy lifting for 4 weeks Diet: regular diet Wound Care: keep wound clean and dry  Follow-up:  With Dr. Lyndel.  Signed: Deward PARAS Faith Evans General, Bariatric, & Minimally Invasive Surgery [facility],  [place]   06/03/2024, 8:28 AM

## 2024-06-03 NOTE — Progress Notes (Signed)
   06/03/24 0950  TOC Brief Assessment  Insurance and Status Reviewed  Patient has primary care physician Yes  Home environment has been reviewed home with spouse  Prior level of function: independent  Prior/Current Home Services No current home services  Social Drivers of Health Review SDOH reviewed no interventions necessary  Readmission risk has been reviewed Yes  Transition of care needs no transition of care needs at this time

## 2024-06-03 NOTE — Progress Notes (Signed)
 06/03/2024 9:34 AM -----------------------------------------------------------[facility]--------------------------------------------------- D(Data) A(Action) R(response)     Data: Discharge Readiness Assessment EDD today 06/03/2024    Action: Chart reviewed    Response: No immediate Barriers to discharge identified at this time.     Belinda Bringhurst, RN [employer]

## 2024-06-03 NOTE — Progress Notes (Signed)
 Discharge instructions given to patient and all questions were answered.

## 2024-06-04 ENCOUNTER — Telehealth: Payer: Self-pay | Admitting: *Deleted

## 2024-06-04 NOTE — Transitions of Care (Post Inpatient/ED Visit) (Signed)
 06/04/2024  Name: Faith Evans MRN: [id number] DOB: 06/12/1981  Today's TOC FU Call Status: Today's TOC FU Call Status:: Successful TOC FU Call Completed TOC FU Call Complete Date: 06/04/24 Patient's Name and Date of Birth confirmed.  Transition Care Management Follow-up Telephone Call Date of Discharge: 06/03/24 Discharge Facility: Faith Evans [facility]) Type of Discharge: Inpatient Admission Primary Inpatient Discharge Diagnosis:: planned surgical colectomy for diverticulitis How have you been since you were released from the hospital?: Better (I am doing fine and not in hardly any pain; glad this planned surgery is behind me.  Don't need to schedule with my PCP; and am still completely independent.  I don't need or want any regular follow up- know to call the surgeon if questions or concerns) Any questions or concerns?: No  Items Reviewed: Did you receive and understand the discharge instructions provided?: Yes (thoroughly reviewed with patient who verbalizes good understanding of same) Medications obtained,verified, and reconciled?: Yes (Medications Reviewed) (Full medication reconciliation/ review completed; no concerns or discrepancies identified; confirmed patient obtained/ is taking all newly Rx'd medications as instructed; self-manages medications and denies questions/ concerns around medications today) Any new allergies since your discharge?: No Dietary orders reviewed?: Yes Type of Diet Ordered:: As healthy as possible Do you have support at home?: Yes People in Home [RPT]: spouse Name of Support/Comfort Primary Source: Reports independent in self-care activities; resides with supportive spouse assists as/ if needed/ indicated  Medications Reviewed Today: Medications Reviewed Today     Reviewed by Faith Evans M, RN (Registered Nurse) on 06/04/24 at 1524  Med List Status: <None>   Medication Order Taking? Sig Documenting Provider Last Dose Status Informant   acetaminophen  (TYLENOL ) 500 MG tablet [id number] Yes Take 1,000 mg by mouth every 6 (six) hours as needed for headache or fever (pain). [provider]  Active Self  fluconazole  (DIFLUCAN ) 150 MG tablet [id number]  Take 150 mg by mouth daily.  Patient not taking: Reported on 06/04/2024   [provider]  Active Self  levonorgestrel (KYLEENA) 19.5 MG IUD [id number]  1 each by Intrauterine route once.  Patient not taking: Reported on 06/04/2024   [provider]  Active Self  methocarbamol (ROBAXIN) 500 MG tablet [id number] Yes Take 1 tablet (500 mg total) by mouth 4 (four) times daily. Faith Evans, Deward PARAS, MD  Active   norethindrone [facility]) 0.35 MG tablet [id number] Yes Take 1 tablet by mouth daily. Starting after Colon Surgery on 06-01-24 [provider]  Active Self  oxyCODONE -acetaminophen  (PERCOCET) 5-325 MG tablet [id number] Yes Take 1 tablet by mouth every 4 (four) hours as needed for severe pain (pain score 7-10). Faith Evans, Deward PARAS, MD  Active            Home Care and Equipment/Supplies: Were Home Health Services Ordered?: No Any new equipment or medical supplies ordered?: No  Functional Questionnaire: Do you need assistance with bathing/showering or dressing?: No Do you need assistance with meal preparation?: No Do you need assistance with eating?: No Do you have difficulty maintaining continence: No Do you need assistance with getting out of bed/getting out of a chair/moving?: No Do you have difficulty managing or taking your medications?: No  Follow up appointments reviewed: PCP Follow-up appointment confirmed?: NA (Declined offer of assistance with scheduling hospital follow up PCP appointment- the surgeon just told me to follow up with him as scheduled on 06/24/24) Specialist Hospital Follow-up appointment confirmed?: Yes Date of Specialist follow-up appointment?: 06/24/24 Follow-Up Specialty Provider:: Surgical provider Do you  need  transportation to your follow-up appointment?: No Do you understand care options if your condition(s) worsen?: Yes-patient verbalized understanding  SDOH Interventions Today    Flowsheet Row Most Recent Value  SDOH Interventions   Food Insecurity Interventions Intervention Not Indicated  Housing Interventions Intervention Not Indicated  Transportation Interventions Intervention Not Indicated  [drives self at baseline,  spouse assisting post- recent planned surgery]  Utilities Interventions Intervention Not Indicated   See TOC assessment tabs for additional assessment/ TOC intervention information  Patient declines need for ongoing/ further care management/ coordination outreach; declines enrollment in 30-day TOC program- declines taking my direct phone number should needs/ concerns arise post-TOC call   Pls call/ message for questions,  [name], RN, BSN, Media planner  Transitions of Care  [facility] [phone]: direct office

## 2024-06-21 ENCOUNTER — Other Ambulatory Visit

## 2024-09-30 ENCOUNTER — Ambulatory Visit: Admitting: Nurse Practitioner
